# Patient Record
Sex: Male | Born: 1935 | Race: White | Hispanic: No | Smoking: Never smoker
Health system: Southern US, Community
[De-identification: ages and names within clinical notes are randomized; demographics above are authoritative.]

## PROBLEM LIST (undated history)

## (undated) DIAGNOSIS — I10 Essential (primary) hypertension: Secondary | ICD-10-CM

## (undated) DIAGNOSIS — K219 Gastro-esophageal reflux disease without esophagitis: Secondary | ICD-10-CM

## (undated) HISTORY — PX: EYE SURGERY: SHX253

## (undated) HISTORY — PX: HERNIA REPAIR: SHX51

---

## 2010-05-31 ENCOUNTER — Encounter (INDEPENDENT_AMBULATORY_CARE_PROVIDER_SITE_OTHER): Payer: Self-pay | Admitting: Ophthalmology

## 2010-05-31 ENCOUNTER — Ambulatory Visit (HOSPITAL_COMMUNITY)
Admission: RE | Admit: 2010-05-31 | Discharge: 2010-06-01 | Payer: Self-pay | Source: Home / Self Care | Attending: Ophthalmology | Admitting: Ophthalmology

## 2010-06-06 LAB — CBC
HCT: 43.7 % (ref 39.0–52.0)
Hemoglobin: 14.7 g/dL (ref 13.0–17.0)
MCH: 30.3 pg (ref 26.0–34.0)
MCHC: 33.6 g/dL (ref 30.0–36.0)
MCV: 90.1 fL (ref 78.0–100.0)
Platelets: 259 10*3/uL (ref 150–400)
RBC: 4.85 MIL/uL (ref 4.22–5.81)
RDW: 12.8 % (ref 11.5–15.5)
WBC: 8.4 10*3/uL (ref 4.0–10.5)

## 2010-06-06 LAB — BASIC METABOLIC PANEL
BUN: 13 mg/dL (ref 6–23)
CO2: 27 mEq/L (ref 19–32)
Calcium: 9.2 mg/dL (ref 8.4–10.5)
Chloride: 104 mEq/L (ref 96–112)
Creatinine, Ser: 1.44 mg/dL (ref 0.4–1.5)
GFR calc Af Amer: 56 mL/min — ABNORMAL LOW (ref >60)
GFR calc non Af Amer: 47 mL/min — ABNORMAL LOW (ref >60)
Glucose, Bld: 87 mg/dL (ref 70–99)
Potassium: 4.7 mEq/L (ref 3.5–5.1)
Sodium: 139 mEq/L (ref 135–145)

## 2010-06-06 LAB — SURGICAL PCR SCREEN
MRSA, PCR: NEGATIVE
Staphylococcus aureus: NEGATIVE

## 2011-03-10 ENCOUNTER — Encounter: Payer: Self-pay | Admitting: Emergency Medicine

## 2011-03-10 ENCOUNTER — Emergency Department (HOSPITAL_COMMUNITY): Payer: Medicare Other

## 2011-03-10 ENCOUNTER — Emergency Department (HOSPITAL_COMMUNITY)
Admission: EM | Admit: 2011-03-10 | Discharge: 2011-03-10 | Disposition: A | Discharge status: Home / Self Care | Payer: Medicare Other | Attending: Emergency Medicine | Admitting: Emergency Medicine

## 2011-03-10 ENCOUNTER — Other Ambulatory Visit: Payer: Self-pay

## 2011-03-10 DIAGNOSIS — X58XXXA Exposure to other specified factors, initial encounter: Secondary | ICD-10-CM | POA: Insufficient documentation

## 2011-03-10 DIAGNOSIS — S46819A Strain of other muscles, fascia and tendons at shoulder and upper arm level, unspecified arm, initial encounter: Secondary | ICD-10-CM

## 2011-03-10 DIAGNOSIS — I491 Atrial premature depolarization: Secondary | ICD-10-CM | POA: Insufficient documentation

## 2011-03-10 DIAGNOSIS — S43499A Other sprain of unspecified shoulder joint, initial encounter: Secondary | ICD-10-CM | POA: Insufficient documentation

## 2011-03-10 DIAGNOSIS — K219 Gastro-esophageal reflux disease without esophagitis: Secondary | ICD-10-CM | POA: Insufficient documentation

## 2011-03-10 DIAGNOSIS — I10 Essential (primary) hypertension: Secondary | ICD-10-CM | POA: Insufficient documentation

## 2011-03-10 DIAGNOSIS — R079 Chest pain, unspecified: Secondary | ICD-10-CM | POA: Insufficient documentation

## 2011-03-10 DIAGNOSIS — I4949 Other premature depolarization: Secondary | ICD-10-CM | POA: Insufficient documentation

## 2011-03-10 HISTORY — DX: Essential (primary) hypertension: I10

## 2011-03-10 HISTORY — DX: Gastro-esophageal reflux disease without esophagitis: K21.9

## 2011-03-10 LAB — CBC
HCT: 42.8 % (ref 39.0–52.0)
Hemoglobin: 14.5 g/dL (ref 13.0–17.0)
MCH: 30.3 pg (ref 26.0–34.0)
MCHC: 33.9 g/dL (ref 30.0–36.0)
MCV: 89.4 fL (ref 78.0–100.0)
Platelets: 218 10*3/uL (ref 150–400)
RBC: 4.79 MIL/uL (ref 4.22–5.81)
RDW: 12.7 % (ref 11.5–15.5)
WBC: 9.4 10*3/uL (ref 4.0–10.5)

## 2011-03-10 LAB — DIFFERENTIAL
Basophils Absolute: 0 10*3/uL (ref 0.0–0.1)
Basophils Relative: 0 % (ref 0–1)
Eosinophils Absolute: 0.3 10*3/uL (ref 0.0–0.7)
Eosinophils Relative: 4 % (ref 0–5)
Lymphocytes Relative: 31 % (ref 12–46)
Lymphs Abs: 3 10*3/uL (ref 0.7–4.0)
Monocytes Absolute: 0.8 10*3/uL (ref 0.1–1.0)
Monocytes Relative: 9 % (ref 3–12)
Neutro Abs: 5.3 10*3/uL (ref 1.7–7.7)
Neutrophils Relative %: 56 % (ref 43–77)

## 2011-03-10 LAB — TROPONIN I: Troponin I: <0.3 ng/mL (ref <0.30)

## 2011-03-10 LAB — BASIC METABOLIC PANEL
BUN: 10 mg/dL (ref 6–23)
CO2: 28 mEq/L (ref 19–32)
Calcium: 9.2 mg/dL (ref 8.4–10.5)
Chloride: 104 mEq/L (ref 96–112)
Creatinine, Ser: 1.33 mg/dL (ref 0.50–1.35)
GFR calc Af Amer: 54 mL/min — ABNORMAL LOW (ref >90)
GFR calc non Af Amer: 47 mL/min — ABNORMAL LOW (ref >90)
Glucose, Bld: 87 mg/dL (ref 70–99)
Potassium: 4 mEq/L (ref 3.5–5.1)
Sodium: 141 mEq/L (ref 135–145)

## 2011-03-10 MED ORDER — HYDROCODONE-ACETAMINOPHEN 5-325 MG PO TABS: 1.0000 | ORAL_TABLET | ORAL | Status: AC | PRN

## 2011-03-10 NOTE — ED Notes (Signed)
Pt c/o numbness and tingling to left arm. Pt states he also is having indigestion.

## 2011-03-10 NOTE — ED Notes (Signed)
Pt c/o intermittent left arm numbness/pain radiating into neck x 10 days with indigestion. Pain worse today.

## 2011-03-10 NOTE — ED Provider Notes (Signed)
History     CSN: [id number] Arrival date & time: 03/10/2011  5:47 PM   First MD Initiated Contact with Patient 03/10/11 1759      Chief Complaint  Patient presents with  . Arm Pain    (Consider location/radiation/quality/duration/timing/severity/associated sxs/prior treatment) The history is provided by the patient.   patient has had left shoulder pain going down his left arm for the last 10 days. There is no trauma. He states it gets worse when he is at rest. He states it feels like it goes asleep. He states it also goes up the left side of his neck. He also has having episodes of some indigestion for the last 3 days. He states this feels like his acid reflux. It is not associated with the chest pain. No cough or dyspnea. No fevers. No trauma. He states that the pains are better with physical activity. He states he just made the neck pain worse and I said made it better. He does not smoke. Never had heart problems, although he does have high blood pressure.  Past Medical History  Diagnosis Date  . Hypertension   . Acid reflux     Past Surgical History  Procedure Date  . Eye surgery   . Hernia repair     History reviewed. No pertinent family history.  History  Substance Use Topics  . Smoking status: Never Smoker   . Smokeless tobacco: Not on file  . Alcohol Use: No      Review of Systems  Constitutional: Negative for activity change and appetite change.  HENT: Negative for neck stiffness.   Eyes: Negative for pain.  Respiratory: Negative for chest tightness and shortness of breath.   Cardiovascular: Negative for chest pain and leg swelling.  Gastrointestinal: Negative for nausea, vomiting, abdominal pain and diarrhea.  Genitourinary: Negative for flank pain.  Musculoskeletal: Negative for back pain and joint swelling.  Skin: Negative for rash.  Neurological: Positive for numbness. Negative for weakness and headaches.  Psychiatric/Behavioral: Negative for behavioral  problems.    Allergies  Review of patient's allergies indicates no known allergies.  Home Medications   Current Outpatient Rx  Name Route Sig Dispense Refill  . LISINOPRIL 10 MG PO TABS Oral Take 10 mg by mouth daily.      Marland Kitchen OMEPRAZOLE 20 MG PO CPDR Oral Take 20 mg by mouth daily.      Marland Kitchen HYDROCODONE-ACETAMINOPHEN 5-325 MG PO TABS Oral Take 1 tablet by mouth every 4 (four) hours as needed for pain. 10 tablet 0    BP 170/72  Pulse 78  Temp(Src) 97.4 F (36.3 C) (Oral)  Resp 20  Ht 5\' 7"  (1.702 m)  Wt 135 lb (61.236 kg)  BMI 21.14 kg/m2  SpO2 100%  Physical Exam  Nursing note and vitals reviewed. Constitutional: He is oriented to person, place, and time. He appears well-developed and well-nourished.  HENT:  Head: Normocephalic and atraumatic.       Patient is somewhat hard of hearing  Eyes: EOM are normal. Pupils are equal, round, and reactive to light.  Neck: Normal range of motion. Neck supple.  Cardiovascular: Normal rate, regular rhythm and normal heart sounds.   No murmur heard. Pulmonary/Chest: Effort normal and breath sounds normal.  Abdominal: Soft. Bowel sounds are normal. He exhibits no distension and no mass. There is no tenderness. There is no rebound and no guarding.  Musculoskeletal: Normal range of motion. He exhibits no edema.       Patient is  tender over his left trapezius.  Neurological: He is alert and oriented to person, place, and time. No cranial nerve deficit.  Skin: Skin is warm and dry.  Psychiatric: He has a normal mood and affect.    ED Course  Procedures (including critical care time)  Labs Reviewed  BASIC METABOLIC PANEL - Abnormal; Notable for the following:    GFR calc non Af Amer 47 (*)    GFR calc Af Amer 54 (*)    All other components within normal limits  TROPONIN I  CBC  DIFFERENTIAL   Dg Chest 2 View  03/10/2011  *RADIOLOGY REPORT*  Clinical Data: Chest pain  CHEST - 2 VIEW  Comparison: Chest radiograph 05/31/2010  Findings:  Normal mediastinum and cardiac silhouette.  No effusion, infiltrate, or pneumothorax. Degenerative osteophytosis of the thoracic spine.  IMPRESSION: No acute cardiopulmonary process.  The  Original Report Authenticated By: Genevive Bi, M.D.     1. Trapezius muscle strain      Date: 03/10/2011  Rate: 80  Rhythm: normal sinus rhythm, premature atrial contractions (PAC) and premature ventricular contractions (PVC)  QRS Axis: normal  Intervals: normal  ST/T Wave abnormalities: normal  Conduction Disutrbances:none  Narrative Interpretation:   Old EKG Reviewed: PVC and PACs new    MDM  Patient has had pain in his left shoulder and arm and up his neck for last 10 days. He notices more at rest. He states it is better with work. He states that ice has helped he does get worse. He states the arm feels tingly. He also has had some pain that he describes as his GERD. He states that it is not associated with the arm pain. EKG is reassuring. X-ray also reassuring. Patient is tender over the trapezius the likely source of his pain. he'll be discharged to follow with his Dr.        Juliet Rude. Rubin Payor, MD 03/10/11 1955

## 2011-05-31 ENCOUNTER — Encounter (INDEPENDENT_AMBULATORY_CARE_PROVIDER_SITE_OTHER): Payer: Medicare Other | Admitting: Ophthalmology

## 2011-05-31 DIAGNOSIS — H353290 Exudative age-related macular degeneration, unspecified eye, stage unspecified: Secondary | ICD-10-CM

## 2011-05-31 DIAGNOSIS — H43819 Vitreous degeneration, unspecified eye: Secondary | ICD-10-CM

## 2011-05-31 DIAGNOSIS — H3530 Unspecified macular degeneration: Secondary | ICD-10-CM

## 2011-05-31 DIAGNOSIS — H35059 Retinal neovascularization, unspecified, unspecified eye: Secondary | ICD-10-CM

## 2011-06-08 ENCOUNTER — Other Ambulatory Visit (INDEPENDENT_AMBULATORY_CARE_PROVIDER_SITE_OTHER): Payer: Medicare Other | Admitting: Ophthalmology

## 2011-06-08 DIAGNOSIS — H353290 Exudative age-related macular degeneration, unspecified eye, stage unspecified: Secondary | ICD-10-CM

## 2011-06-08 DIAGNOSIS — H3530 Unspecified macular degeneration: Secondary | ICD-10-CM

## 2011-06-08 DIAGNOSIS — H43819 Vitreous degeneration, unspecified eye: Secondary | ICD-10-CM

## 2011-06-08 DIAGNOSIS — H35059 Retinal neovascularization, unspecified, unspecified eye: Secondary | ICD-10-CM

## 2011-06-30 ENCOUNTER — Encounter (INDEPENDENT_AMBULATORY_CARE_PROVIDER_SITE_OTHER): Payer: Medicare Other | Admitting: Ophthalmology

## 2011-06-30 DIAGNOSIS — H43819 Vitreous degeneration, unspecified eye: Secondary | ICD-10-CM

## 2011-06-30 DIAGNOSIS — H3530 Unspecified macular degeneration: Secondary | ICD-10-CM

## 2011-06-30 DIAGNOSIS — H353290 Exudative age-related macular degeneration, unspecified eye, stage unspecified: Secondary | ICD-10-CM

## 2011-07-28 ENCOUNTER — Encounter (INDEPENDENT_AMBULATORY_CARE_PROVIDER_SITE_OTHER): Payer: Medicare Other | Admitting: Ophthalmology

## 2011-07-28 DIAGNOSIS — H353290 Exudative age-related macular degeneration, unspecified eye, stage unspecified: Secondary | ICD-10-CM

## 2011-07-28 DIAGNOSIS — H43819 Vitreous degeneration, unspecified eye: Secondary | ICD-10-CM

## 2011-07-28 DIAGNOSIS — H3530 Unspecified macular degeneration: Secondary | ICD-10-CM

## 2011-08-30 ENCOUNTER — Encounter (INDEPENDENT_AMBULATORY_CARE_PROVIDER_SITE_OTHER): Payer: Medicare Other | Admitting: Ophthalmology

## 2011-08-30 DIAGNOSIS — H353290 Exudative age-related macular degeneration, unspecified eye, stage unspecified: Secondary | ICD-10-CM

## 2011-08-30 DIAGNOSIS — H43819 Vitreous degeneration, unspecified eye: Secondary | ICD-10-CM

## 2011-08-30 DIAGNOSIS — H3530 Unspecified macular degeneration: Secondary | ICD-10-CM

## 2011-09-28 ENCOUNTER — Encounter (INDEPENDENT_AMBULATORY_CARE_PROVIDER_SITE_OTHER): Payer: Medicare Other | Admitting: Ophthalmology

## 2011-10-02 ENCOUNTER — Encounter (INDEPENDENT_AMBULATORY_CARE_PROVIDER_SITE_OTHER): Payer: Medicare Other | Admitting: Ophthalmology

## 2011-10-02 DIAGNOSIS — H353290 Exudative age-related macular degeneration, unspecified eye, stage unspecified: Secondary | ICD-10-CM

## 2011-10-02 DIAGNOSIS — H3530 Unspecified macular degeneration: Secondary | ICD-10-CM

## 2011-10-30 ENCOUNTER — Encounter (INDEPENDENT_AMBULATORY_CARE_PROVIDER_SITE_OTHER): Payer: Medicare Other | Admitting: Ophthalmology

## 2011-10-30 DIAGNOSIS — H3530 Unspecified macular degeneration: Secondary | ICD-10-CM

## 2011-10-30 DIAGNOSIS — H353290 Exudative age-related macular degeneration, unspecified eye, stage unspecified: Secondary | ICD-10-CM

## 2011-10-30 DIAGNOSIS — H43819 Vitreous degeneration, unspecified eye: Secondary | ICD-10-CM

## 2011-11-27 ENCOUNTER — Encounter (INDEPENDENT_AMBULATORY_CARE_PROVIDER_SITE_OTHER): Payer: Medicare Other | Admitting: Ophthalmology

## 2011-11-27 DIAGNOSIS — H43399 Other vitreous opacities, unspecified eye: Secondary | ICD-10-CM

## 2011-11-27 DIAGNOSIS — H353290 Exudative age-related macular degeneration, unspecified eye, stage unspecified: Secondary | ICD-10-CM

## 2011-11-27 DIAGNOSIS — H3530 Unspecified macular degeneration: Secondary | ICD-10-CM

## 2011-11-27 DIAGNOSIS — H35439 Paving stone degeneration of retina, unspecified eye: Secondary | ICD-10-CM

## 2011-11-27 DIAGNOSIS — H35039 Hypertensive retinopathy, unspecified eye: Secondary | ICD-10-CM

## 2011-11-27 DIAGNOSIS — I10 Essential (primary) hypertension: Secondary | ICD-10-CM

## 2012-01-01 ENCOUNTER — Encounter (INDEPENDENT_AMBULATORY_CARE_PROVIDER_SITE_OTHER): Payer: Medicare Other | Admitting: Ophthalmology

## 2012-01-01 DIAGNOSIS — H43819 Vitreous degeneration, unspecified eye: Secondary | ICD-10-CM

## 2012-01-01 DIAGNOSIS — H353290 Exudative age-related macular degeneration, unspecified eye, stage unspecified: Secondary | ICD-10-CM

## 2012-01-01 DIAGNOSIS — I10 Essential (primary) hypertension: Secondary | ICD-10-CM

## 2012-01-01 DIAGNOSIS — H3530 Unspecified macular degeneration: Secondary | ICD-10-CM

## 2012-01-01 DIAGNOSIS — H35039 Hypertensive retinopathy, unspecified eye: Secondary | ICD-10-CM

## 2012-02-05 ENCOUNTER — Encounter (INDEPENDENT_AMBULATORY_CARE_PROVIDER_SITE_OTHER): Payer: Medicare Other | Admitting: Ophthalmology

## 2012-02-05 DIAGNOSIS — I10 Essential (primary) hypertension: Secondary | ICD-10-CM

## 2012-02-05 DIAGNOSIS — H353290 Exudative age-related macular degeneration, unspecified eye, stage unspecified: Secondary | ICD-10-CM

## 2012-02-05 DIAGNOSIS — H3530 Unspecified macular degeneration: Secondary | ICD-10-CM

## 2012-02-05 DIAGNOSIS — H43819 Vitreous degeneration, unspecified eye: Secondary | ICD-10-CM

## 2012-02-05 DIAGNOSIS — H35039 Hypertensive retinopathy, unspecified eye: Secondary | ICD-10-CM

## 2012-03-04 ENCOUNTER — Encounter (INDEPENDENT_AMBULATORY_CARE_PROVIDER_SITE_OTHER): Payer: Medicare Other | Admitting: Ophthalmology

## 2012-03-04 DIAGNOSIS — H35039 Hypertensive retinopathy, unspecified eye: Secondary | ICD-10-CM

## 2012-03-04 DIAGNOSIS — H43819 Vitreous degeneration, unspecified eye: Secondary | ICD-10-CM

## 2012-03-04 DIAGNOSIS — H353290 Exudative age-related macular degeneration, unspecified eye, stage unspecified: Secondary | ICD-10-CM

## 2012-03-04 DIAGNOSIS — I10 Essential (primary) hypertension: Secondary | ICD-10-CM

## 2012-03-04 DIAGNOSIS — H3530 Unspecified macular degeneration: Secondary | ICD-10-CM

## 2012-04-01 ENCOUNTER — Encounter (INDEPENDENT_AMBULATORY_CARE_PROVIDER_SITE_OTHER): Payer: Medicare Other | Admitting: Ophthalmology

## 2012-04-11 ENCOUNTER — Encounter (INDEPENDENT_AMBULATORY_CARE_PROVIDER_SITE_OTHER): Payer: Medicare Other | Admitting: Ophthalmology

## 2012-04-11 DIAGNOSIS — H35039 Hypertensive retinopathy, unspecified eye: Secondary | ICD-10-CM

## 2012-04-11 DIAGNOSIS — H3530 Unspecified macular degeneration: Secondary | ICD-10-CM

## 2012-04-11 DIAGNOSIS — H43819 Vitreous degeneration, unspecified eye: Secondary | ICD-10-CM

## 2012-04-11 DIAGNOSIS — I10 Essential (primary) hypertension: Secondary | ICD-10-CM

## 2012-04-11 DIAGNOSIS — H353290 Exudative age-related macular degeneration, unspecified eye, stage unspecified: Secondary | ICD-10-CM

## 2012-05-23 ENCOUNTER — Encounter (INDEPENDENT_AMBULATORY_CARE_PROVIDER_SITE_OTHER): Payer: Medicare Other | Admitting: Ophthalmology

## 2012-05-23 DIAGNOSIS — H35039 Hypertensive retinopathy, unspecified eye: Secondary | ICD-10-CM

## 2012-05-23 DIAGNOSIS — H3530 Unspecified macular degeneration: Secondary | ICD-10-CM

## 2012-05-23 DIAGNOSIS — H43819 Vitreous degeneration, unspecified eye: Secondary | ICD-10-CM

## 2012-05-23 DIAGNOSIS — H353290 Exudative age-related macular degeneration, unspecified eye, stage unspecified: Secondary | ICD-10-CM

## 2012-05-23 DIAGNOSIS — I10 Essential (primary) hypertension: Secondary | ICD-10-CM

## 2012-06-24 ENCOUNTER — Encounter (INDEPENDENT_AMBULATORY_CARE_PROVIDER_SITE_OTHER): Payer: Medicare Other | Admitting: Ophthalmology

## 2012-06-24 DIAGNOSIS — H3530 Unspecified macular degeneration: Secondary | ICD-10-CM

## 2012-06-24 DIAGNOSIS — H35039 Hypertensive retinopathy, unspecified eye: Secondary | ICD-10-CM

## 2012-06-24 DIAGNOSIS — I10 Essential (primary) hypertension: Secondary | ICD-10-CM

## 2012-06-24 DIAGNOSIS — H353290 Exudative age-related macular degeneration, unspecified eye, stage unspecified: Secondary | ICD-10-CM

## 2012-07-29 ENCOUNTER — Encounter (INDEPENDENT_AMBULATORY_CARE_PROVIDER_SITE_OTHER): Payer: Medicare Other | Admitting: Ophthalmology

## 2012-07-31 ENCOUNTER — Encounter (INDEPENDENT_AMBULATORY_CARE_PROVIDER_SITE_OTHER): Payer: Medicare Other | Admitting: Ophthalmology

## 2012-07-31 DIAGNOSIS — H353290 Exudative age-related macular degeneration, unspecified eye, stage unspecified: Secondary | ICD-10-CM

## 2012-09-11 ENCOUNTER — Encounter (INDEPENDENT_AMBULATORY_CARE_PROVIDER_SITE_OTHER): Payer: Medicare Other | Admitting: Ophthalmology

## 2012-09-11 DIAGNOSIS — I10 Essential (primary) hypertension: Secondary | ICD-10-CM

## 2012-09-11 DIAGNOSIS — H353290 Exudative age-related macular degeneration, unspecified eye, stage unspecified: Secondary | ICD-10-CM

## 2012-09-11 DIAGNOSIS — H3530 Unspecified macular degeneration: Secondary | ICD-10-CM

## 2012-09-11 DIAGNOSIS — H43819 Vitreous degeneration, unspecified eye: Secondary | ICD-10-CM

## 2012-09-11 DIAGNOSIS — H35039 Hypertensive retinopathy, unspecified eye: Secondary | ICD-10-CM

## 2012-10-24 ENCOUNTER — Encounter (INDEPENDENT_AMBULATORY_CARE_PROVIDER_SITE_OTHER): Payer: Medicare Other | Admitting: Ophthalmology

## 2012-10-24 DIAGNOSIS — H43819 Vitreous degeneration, unspecified eye: Secondary | ICD-10-CM

## 2012-10-24 DIAGNOSIS — I10 Essential (primary) hypertension: Secondary | ICD-10-CM

## 2012-10-24 DIAGNOSIS — H35039 Hypertensive retinopathy, unspecified eye: Secondary | ICD-10-CM

## 2012-10-24 DIAGNOSIS — H3530 Unspecified macular degeneration: Secondary | ICD-10-CM

## 2012-10-24 DIAGNOSIS — H353290 Exudative age-related macular degeneration, unspecified eye, stage unspecified: Secondary | ICD-10-CM

## 2012-12-06 ENCOUNTER — Encounter (INDEPENDENT_AMBULATORY_CARE_PROVIDER_SITE_OTHER): Payer: Medicare Other | Admitting: Ophthalmology

## 2012-12-06 DIAGNOSIS — H43819 Vitreous degeneration, unspecified eye: Secondary | ICD-10-CM

## 2012-12-06 DIAGNOSIS — H3530 Unspecified macular degeneration: Secondary | ICD-10-CM

## 2012-12-06 DIAGNOSIS — H35039 Hypertensive retinopathy, unspecified eye: Secondary | ICD-10-CM

## 2012-12-06 DIAGNOSIS — H353290 Exudative age-related macular degeneration, unspecified eye, stage unspecified: Secondary | ICD-10-CM

## 2012-12-06 DIAGNOSIS — I10 Essential (primary) hypertension: Secondary | ICD-10-CM

## 2013-01-24 ENCOUNTER — Encounter (INDEPENDENT_AMBULATORY_CARE_PROVIDER_SITE_OTHER): Payer: Medicare Other | Admitting: Ophthalmology

## 2013-01-24 DIAGNOSIS — H353290 Exudative age-related macular degeneration, unspecified eye, stage unspecified: Secondary | ICD-10-CM

## 2013-01-24 DIAGNOSIS — H43819 Vitreous degeneration, unspecified eye: Secondary | ICD-10-CM

## 2013-01-24 DIAGNOSIS — H3530 Unspecified macular degeneration: Secondary | ICD-10-CM

## 2013-01-24 DIAGNOSIS — I10 Essential (primary) hypertension: Secondary | ICD-10-CM

## 2013-01-24 DIAGNOSIS — H35039 Hypertensive retinopathy, unspecified eye: Secondary | ICD-10-CM

## 2013-03-14 ENCOUNTER — Encounter (INDEPENDENT_AMBULATORY_CARE_PROVIDER_SITE_OTHER): Payer: Medicare Other | Admitting: Ophthalmology

## 2013-03-14 DIAGNOSIS — H353290 Exudative age-related macular degeneration, unspecified eye, stage unspecified: Secondary | ICD-10-CM

## 2013-03-14 DIAGNOSIS — I10 Essential (primary) hypertension: Secondary | ICD-10-CM

## 2013-03-14 DIAGNOSIS — H35039 Hypertensive retinopathy, unspecified eye: Secondary | ICD-10-CM

## 2013-03-14 DIAGNOSIS — H3530 Unspecified macular degeneration: Secondary | ICD-10-CM

## 2013-03-14 DIAGNOSIS — H43819 Vitreous degeneration, unspecified eye: Secondary | ICD-10-CM

## 2013-05-09 ENCOUNTER — Encounter (INDEPENDENT_AMBULATORY_CARE_PROVIDER_SITE_OTHER): Payer: Medicare Other | Admitting: Ophthalmology

## 2013-05-09 DIAGNOSIS — H35039 Hypertensive retinopathy, unspecified eye: Secondary | ICD-10-CM

## 2013-05-09 DIAGNOSIS — H43819 Vitreous degeneration, unspecified eye: Secondary | ICD-10-CM

## 2013-05-09 DIAGNOSIS — H353290 Exudative age-related macular degeneration, unspecified eye, stage unspecified: Secondary | ICD-10-CM

## 2013-05-09 DIAGNOSIS — I10 Essential (primary) hypertension: Secondary | ICD-10-CM

## 2013-05-09 DIAGNOSIS — H3530 Unspecified macular degeneration: Secondary | ICD-10-CM

## 2013-07-18 ENCOUNTER — Encounter (INDEPENDENT_AMBULATORY_CARE_PROVIDER_SITE_OTHER): Payer: Medicare Other | Admitting: Ophthalmology

## 2013-09-03 ENCOUNTER — Encounter (INDEPENDENT_AMBULATORY_CARE_PROVIDER_SITE_OTHER): Payer: Medicare Other | Admitting: Ophthalmology

## 2013-09-03 DIAGNOSIS — H3530 Unspecified macular degeneration: Secondary | ICD-10-CM

## 2013-09-03 DIAGNOSIS — H353290 Exudative age-related macular degeneration, unspecified eye, stage unspecified: Secondary | ICD-10-CM

## 2013-09-03 DIAGNOSIS — H35039 Hypertensive retinopathy, unspecified eye: Secondary | ICD-10-CM

## 2013-09-03 DIAGNOSIS — I10 Essential (primary) hypertension: Secondary | ICD-10-CM

## 2013-09-03 DIAGNOSIS — H43819 Vitreous degeneration, unspecified eye: Secondary | ICD-10-CM

## 2013-12-03 ENCOUNTER — Encounter (INDEPENDENT_AMBULATORY_CARE_PROVIDER_SITE_OTHER): Payer: Medicare Other | Admitting: Ophthalmology

## 2013-12-03 DIAGNOSIS — H35039 Hypertensive retinopathy, unspecified eye: Secondary | ICD-10-CM

## 2013-12-03 DIAGNOSIS — H3530 Unspecified macular degeneration: Secondary | ICD-10-CM

## 2013-12-03 DIAGNOSIS — I10 Essential (primary) hypertension: Secondary | ICD-10-CM

## 2013-12-03 DIAGNOSIS — H43819 Vitreous degeneration, unspecified eye: Secondary | ICD-10-CM

## 2013-12-03 DIAGNOSIS — H353290 Exudative age-related macular degeneration, unspecified eye, stage unspecified: Secondary | ICD-10-CM

## 2014-04-01 ENCOUNTER — Encounter (INDEPENDENT_AMBULATORY_CARE_PROVIDER_SITE_OTHER): Payer: Medicare Other | Admitting: Ophthalmology

## 2014-05-13 ENCOUNTER — Encounter (INDEPENDENT_AMBULATORY_CARE_PROVIDER_SITE_OTHER): Payer: Medicare Other | Admitting: Ophthalmology

## 2014-05-27 ENCOUNTER — Encounter (INDEPENDENT_AMBULATORY_CARE_PROVIDER_SITE_OTHER): Payer: Medicare Other | Admitting: Ophthalmology

## 2014-05-27 DIAGNOSIS — H43813 Vitreous degeneration, bilateral: Secondary | ICD-10-CM

## 2014-05-27 DIAGNOSIS — H35033 Hypertensive retinopathy, bilateral: Secondary | ICD-10-CM | POA: Diagnosis not present

## 2014-05-27 DIAGNOSIS — H3531 Nonexudative age-related macular degeneration: Secondary | ICD-10-CM

## 2014-05-27 DIAGNOSIS — H3532 Exudative age-related macular degeneration: Secondary | ICD-10-CM | POA: Diagnosis not present

## 2014-05-27 DIAGNOSIS — I10 Essential (primary) hypertension: Secondary | ICD-10-CM

## 2014-07-13 DIAGNOSIS — M722 Plantar fascial fibromatosis: Secondary | ICD-10-CM | POA: Diagnosis not present

## 2014-07-13 DIAGNOSIS — M19072 Primary osteoarthritis, left ankle and foot: Secondary | ICD-10-CM | POA: Diagnosis not present

## 2014-11-25 ENCOUNTER — Encounter (INDEPENDENT_AMBULATORY_CARE_PROVIDER_SITE_OTHER): Payer: Medicare Other | Admitting: Ophthalmology

## 2014-12-07 ENCOUNTER — Encounter (INDEPENDENT_AMBULATORY_CARE_PROVIDER_SITE_OTHER): Payer: Medicare Other | Admitting: Ophthalmology

## 2014-12-07 DIAGNOSIS — I10 Essential (primary) hypertension: Secondary | ICD-10-CM | POA: Diagnosis not present

## 2014-12-07 DIAGNOSIS — H3531 Nonexudative age-related macular degeneration: Secondary | ICD-10-CM

## 2014-12-07 DIAGNOSIS — H35033 Hypertensive retinopathy, bilateral: Secondary | ICD-10-CM | POA: Diagnosis not present

## 2014-12-07 DIAGNOSIS — H43813 Vitreous degeneration, bilateral: Secondary | ICD-10-CM | POA: Diagnosis not present

## 2015-01-11 DIAGNOSIS — Z0000 Encounter for general adult medical examination without abnormal findings: Secondary | ICD-10-CM | POA: Diagnosis not present

## 2015-03-08 ENCOUNTER — Encounter (INDEPENDENT_AMBULATORY_CARE_PROVIDER_SITE_OTHER): Payer: Medicare Other | Admitting: Ophthalmology

## 2015-03-08 DIAGNOSIS — H353213 Exudative age-related macular degeneration, right eye, with inactive scar: Secondary | ICD-10-CM | POA: Diagnosis not present

## 2015-03-08 DIAGNOSIS — H43811 Vitreous degeneration, right eye: Secondary | ICD-10-CM | POA: Diagnosis not present

## 2015-03-08 DIAGNOSIS — I10 Essential (primary) hypertension: Secondary | ICD-10-CM | POA: Diagnosis not present

## 2015-03-08 DIAGNOSIS — H35033 Hypertensive retinopathy, bilateral: Secondary | ICD-10-CM

## 2015-04-27 DIAGNOSIS — H524 Presbyopia: Secondary | ICD-10-CM | POA: Diagnosis not present

## 2015-04-27 DIAGNOSIS — Z961 Presence of intraocular lens: Secondary | ICD-10-CM | POA: Diagnosis not present

## 2015-04-27 DIAGNOSIS — H353112 Nonexudative age-related macular degeneration, right eye, intermediate dry stage: Secondary | ICD-10-CM | POA: Diagnosis not present

## 2015-04-27 DIAGNOSIS — H353222 Exudative age-related macular degeneration, left eye, with inactive choroidal neovascularization: Secondary | ICD-10-CM | POA: Diagnosis not present

## 2015-06-07 ENCOUNTER — Encounter (INDEPENDENT_AMBULATORY_CARE_PROVIDER_SITE_OTHER): Payer: Medicare Other | Admitting: Ophthalmology

## 2015-06-07 DIAGNOSIS — H43813 Vitreous degeneration, bilateral: Secondary | ICD-10-CM | POA: Diagnosis not present

## 2015-06-07 DIAGNOSIS — H35033 Hypertensive retinopathy, bilateral: Secondary | ICD-10-CM | POA: Diagnosis not present

## 2015-06-07 DIAGNOSIS — I10 Essential (primary) hypertension: Secondary | ICD-10-CM | POA: Diagnosis not present

## 2015-06-07 DIAGNOSIS — H353231 Exudative age-related macular degeneration, bilateral, with active choroidal neovascularization: Secondary | ICD-10-CM

## 2015-07-12 DIAGNOSIS — M722 Plantar fascial fibromatosis: Secondary | ICD-10-CM | POA: Diagnosis not present

## 2015-07-12 DIAGNOSIS — I10 Essential (primary) hypertension: Secondary | ICD-10-CM | POA: Diagnosis not present

## 2015-07-12 DIAGNOSIS — M19072 Primary osteoarthritis, left ankle and foot: Secondary | ICD-10-CM | POA: Diagnosis not present

## 2015-07-12 DIAGNOSIS — R1084 Generalized abdominal pain: Secondary | ICD-10-CM | POA: Diagnosis not present

## 2015-07-12 DIAGNOSIS — E782 Mixed hyperlipidemia: Secondary | ICD-10-CM | POA: Diagnosis not present

## 2015-07-19 DIAGNOSIS — H903 Sensorineural hearing loss, bilateral: Secondary | ICD-10-CM | POA: Diagnosis not present

## 2015-07-19 DIAGNOSIS — I10 Essential (primary) hypertension: Secondary | ICD-10-CM | POA: Diagnosis not present

## 2015-07-19 DIAGNOSIS — K219 Gastro-esophageal reflux disease without esophagitis: Secondary | ICD-10-CM | POA: Diagnosis not present

## 2015-07-19 DIAGNOSIS — E782 Mixed hyperlipidemia: Secondary | ICD-10-CM | POA: Diagnosis not present

## 2015-10-11 ENCOUNTER — Encounter (INDEPENDENT_AMBULATORY_CARE_PROVIDER_SITE_OTHER): Payer: Medicare Other | Admitting: Ophthalmology

## 2015-10-20 ENCOUNTER — Encounter (INDEPENDENT_AMBULATORY_CARE_PROVIDER_SITE_OTHER): Payer: Medicare Other | Admitting: Ophthalmology

## 2015-10-20 DIAGNOSIS — I10 Essential (primary) hypertension: Secondary | ICD-10-CM | POA: Diagnosis not present

## 2015-10-20 DIAGNOSIS — H353231 Exudative age-related macular degeneration, bilateral, with active choroidal neovascularization: Secondary | ICD-10-CM | POA: Diagnosis not present

## 2015-10-20 DIAGNOSIS — H43811 Vitreous degeneration, right eye: Secondary | ICD-10-CM

## 2015-10-20 DIAGNOSIS — H35033 Hypertensive retinopathy, bilateral: Secondary | ICD-10-CM | POA: Diagnosis not present

## 2016-02-16 ENCOUNTER — Encounter (INDEPENDENT_AMBULATORY_CARE_PROVIDER_SITE_OTHER): Payer: Medicare Other | Admitting: Ophthalmology

## 2016-02-16 DIAGNOSIS — I10 Essential (primary) hypertension: Secondary | ICD-10-CM | POA: Diagnosis not present

## 2016-02-16 DIAGNOSIS — H353231 Exudative age-related macular degeneration, bilateral, with active choroidal neovascularization: Secondary | ICD-10-CM | POA: Diagnosis not present

## 2016-02-16 DIAGNOSIS — H35033 Hypertensive retinopathy, bilateral: Secondary | ICD-10-CM

## 2016-02-16 DIAGNOSIS — H43811 Vitreous degeneration, right eye: Secondary | ICD-10-CM | POA: Diagnosis not present

## 2016-02-16 DIAGNOSIS — H26491 Other secondary cataract, right eye: Secondary | ICD-10-CM

## 2016-04-26 DIAGNOSIS — I10 Essential (primary) hypertension: Secondary | ICD-10-CM | POA: Diagnosis not present

## 2016-04-26 DIAGNOSIS — M19011 Primary osteoarthritis, right shoulder: Secondary | ICD-10-CM | POA: Diagnosis not present

## 2016-04-26 DIAGNOSIS — M7551 Bursitis of right shoulder: Secondary | ICD-10-CM | POA: Diagnosis not present

## 2016-04-26 DIAGNOSIS — Z6820 Body mass index (BMI) 20.0-20.9, adult: Secondary | ICD-10-CM | POA: Diagnosis not present

## 2016-06-16 DIAGNOSIS — S29019A Strain of muscle and tendon of unspecified wall of thorax, initial encounter: Secondary | ICD-10-CM | POA: Diagnosis not present

## 2016-06-21 ENCOUNTER — Encounter (INDEPENDENT_AMBULATORY_CARE_PROVIDER_SITE_OTHER): Payer: Medicare Other | Admitting: Ophthalmology

## 2016-07-11 DIAGNOSIS — H903 Sensorineural hearing loss, bilateral: Secondary | ICD-10-CM | POA: Diagnosis not present

## 2016-07-11 DIAGNOSIS — B0229 Other postherpetic nervous system involvement: Secondary | ICD-10-CM | POA: Diagnosis not present

## 2016-07-11 DIAGNOSIS — K219 Gastro-esophageal reflux disease without esophagitis: Secondary | ICD-10-CM | POA: Diagnosis not present

## 2016-07-11 DIAGNOSIS — E782 Mixed hyperlipidemia: Secondary | ICD-10-CM | POA: Diagnosis not present

## 2016-07-11 DIAGNOSIS — I10 Essential (primary) hypertension: Secondary | ICD-10-CM | POA: Diagnosis not present

## 2016-07-24 DIAGNOSIS — I10 Essential (primary) hypertension: Secondary | ICD-10-CM | POA: Diagnosis not present

## 2016-08-17 DIAGNOSIS — M5412 Radiculopathy, cervical region: Secondary | ICD-10-CM | POA: Diagnosis not present

## 2016-08-17 DIAGNOSIS — M19012 Primary osteoarthritis, left shoulder: Secondary | ICD-10-CM | POA: Diagnosis not present

## 2016-10-18 ENCOUNTER — Encounter (INDEPENDENT_AMBULATORY_CARE_PROVIDER_SITE_OTHER): Payer: Medicare Other | Admitting: Ophthalmology

## 2016-10-18 DIAGNOSIS — H353231 Exudative age-related macular degeneration, bilateral, with active choroidal neovascularization: Secondary | ICD-10-CM | POA: Diagnosis not present

## 2016-10-18 DIAGNOSIS — H35033 Hypertensive retinopathy, bilateral: Secondary | ICD-10-CM | POA: Diagnosis not present

## 2016-10-18 DIAGNOSIS — H2703 Aphakia, bilateral: Secondary | ICD-10-CM

## 2016-10-18 DIAGNOSIS — H26491 Other secondary cataract, right eye: Secondary | ICD-10-CM | POA: Diagnosis not present

## 2016-10-18 DIAGNOSIS — I10 Essential (primary) hypertension: Secondary | ICD-10-CM | POA: Diagnosis not present

## 2016-10-18 DIAGNOSIS — H43813 Vitreous degeneration, bilateral: Secondary | ICD-10-CM

## 2016-11-09 ENCOUNTER — Ambulatory Visit (INDEPENDENT_AMBULATORY_CARE_PROVIDER_SITE_OTHER): Payer: Medicare Other | Admitting: Ophthalmology

## 2016-11-09 DIAGNOSIS — H2701 Aphakia, right eye: Secondary | ICD-10-CM

## 2017-01-01 DIAGNOSIS — M5412 Radiculopathy, cervical region: Secondary | ICD-10-CM | POA: Diagnosis not present

## 2017-01-01 DIAGNOSIS — M19012 Primary osteoarthritis, left shoulder: Secondary | ICD-10-CM | POA: Diagnosis not present

## 2017-03-08 ENCOUNTER — Encounter (INDEPENDENT_AMBULATORY_CARE_PROVIDER_SITE_OTHER): Payer: Medicare Other | Admitting: Ophthalmology

## 2017-04-02 ENCOUNTER — Encounter (INDEPENDENT_AMBULATORY_CARE_PROVIDER_SITE_OTHER): Payer: Medicare Other | Admitting: Ophthalmology

## 2017-04-02 DIAGNOSIS — H353231 Exudative age-related macular degeneration, bilateral, with active choroidal neovascularization: Secondary | ICD-10-CM | POA: Diagnosis not present

## 2017-04-02 DIAGNOSIS — I10 Essential (primary) hypertension: Secondary | ICD-10-CM

## 2017-04-02 DIAGNOSIS — H35033 Hypertensive retinopathy, bilateral: Secondary | ICD-10-CM

## 2017-04-02 DIAGNOSIS — H43813 Vitreous degeneration, bilateral: Secondary | ICD-10-CM | POA: Diagnosis not present

## 2017-07-30 ENCOUNTER — Encounter (INDEPENDENT_AMBULATORY_CARE_PROVIDER_SITE_OTHER): Payer: Medicare Other | Admitting: Ophthalmology

## 2017-07-30 DIAGNOSIS — H35033 Hypertensive retinopathy, bilateral: Secondary | ICD-10-CM

## 2017-07-30 DIAGNOSIS — H353231 Exudative age-related macular degeneration, bilateral, with active choroidal neovascularization: Secondary | ICD-10-CM

## 2017-07-30 DIAGNOSIS — H43811 Vitreous degeneration, right eye: Secondary | ICD-10-CM

## 2017-07-30 DIAGNOSIS — H35342 Macular cyst, hole, or pseudohole, left eye: Secondary | ICD-10-CM | POA: Diagnosis not present

## 2017-07-30 DIAGNOSIS — I10 Essential (primary) hypertension: Secondary | ICD-10-CM | POA: Diagnosis not present

## 2017-10-03 ENCOUNTER — Encounter (HOSPITAL_COMMUNITY): Payer: Self-pay | Admitting: Emergency Medicine

## 2017-10-03 ENCOUNTER — Emergency Department (HOSPITAL_COMMUNITY)
Admission: EM | Admit: 2017-10-03 | Discharge: 2017-10-03 | Disposition: A | Discharge status: Home / Self Care | Payer: Medicare Other | Attending: Emergency Medicine | Admitting: Emergency Medicine

## 2017-10-03 ENCOUNTER — Other Ambulatory Visit: Payer: Self-pay

## 2017-10-03 ENCOUNTER — Emergency Department (HOSPITAL_COMMUNITY): Payer: Medicare Other

## 2017-10-03 DIAGNOSIS — R0789 Other chest pain: Secondary | ICD-10-CM | POA: Insufficient documentation

## 2017-10-03 DIAGNOSIS — I10 Essential (primary) hypertension: Secondary | ICD-10-CM | POA: Insufficient documentation

## 2017-10-03 DIAGNOSIS — Z79899 Other long term (current) drug therapy: Secondary | ICD-10-CM | POA: Diagnosis not present

## 2017-10-03 DIAGNOSIS — R079 Chest pain, unspecified: Secondary | ICD-10-CM | POA: Diagnosis not present

## 2017-10-03 LAB — BASIC METABOLIC PANEL
Anion gap: 9 (ref 5–15)
BUN: 17 mg/dL (ref 6–20)
CO2: 25 mmol/L (ref 22–32)
Calcium: 8.7 mg/dL — ABNORMAL LOW (ref 8.9–10.3)
Chloride: 104 mmol/L (ref 101–111)
Creatinine, Ser: 1.53 mg/dL — ABNORMAL HIGH (ref 0.61–1.24)
GFR calc Af Amer: 43 mL/min — ABNORMAL LOW (ref >60)
GFR calc non Af Amer: 37 mL/min — ABNORMAL LOW (ref >60)
Glucose, Bld: 87 mg/dL (ref 65–99)
Potassium: 3.7 mmol/L (ref 3.5–5.1)
Sodium: 138 mmol/L (ref 135–145)

## 2017-10-03 LAB — HEPATIC FUNCTION PANEL
ALT: 12 U/L — ABNORMAL LOW (ref 17–63)
AST: 21 U/L (ref 15–41)
Albumin: 3.3 g/dL — ABNORMAL LOW (ref 3.5–5.0)
Alkaline Phosphatase: 91 U/L (ref 38–126)
Bilirubin, Direct: 0.2 mg/dL (ref 0.1–0.5)
Indirect Bilirubin: 0.5 mg/dL (ref 0.3–0.9)
Total Bilirubin: 0.7 mg/dL (ref 0.3–1.2)
Total Protein: 6.2 g/dL — ABNORMAL LOW (ref 6.5–8.1)

## 2017-10-03 LAB — LIPASE, BLOOD: Lipase: 33 U/L (ref 11–51)

## 2017-10-03 LAB — CBC
HCT: 37.5 % — ABNORMAL LOW (ref 39.0–52.0)
Hemoglobin: 12.2 g/dL — ABNORMAL LOW (ref 13.0–17.0)
MCH: 29.9 pg (ref 26.0–34.0)
MCHC: 32.5 g/dL (ref 30.0–36.0)
MCV: 91.9 fL (ref 78.0–100.0)
Platelets: 230 10*3/uL (ref 150–400)
RBC: 4.08 MIL/uL — ABNORMAL LOW (ref 4.22–5.81)
RDW: 12.8 % (ref 11.5–15.5)
WBC: 7.9 10*3/uL (ref 4.0–10.5)

## 2017-10-03 LAB — TROPONIN I
Troponin I: <0.03 ng/mL (ref <0.03)
Troponin I: <0.03 ng/mL (ref <0.03)

## 2017-10-03 MED ORDER — SODIUM CHLORIDE 0.9 % IV BOLUS
1000.0000 mL | Freq: Once | INTRAVENOUS | Status: AC
  Administered 2017-10-03: 1000 mL via INTRAVENOUS

## 2017-10-03 NOTE — ED Provider Notes (Signed)
[facility] EMERGENCY DEPARTMENT Provider Note   CSN: [id number] Arrival date & time: 10/03/17  1655     History   Chief Complaint Chief Complaint  Patient presents with  . Chest Pain    HPI Hector Lowe is a 84 y.o. male.  HPI Patient presents with 2 family members to assist with the HPI. They note that the patient was well until today, about 10 hours ago. About the time he developed some pain in the left upper chest, left axilla. Symptoms persisted in spite of taking antacid medication and Tums. However, symptoms have since resolved, and currently he states that he feels fine, only with mild abdominal unsettled sensation, but no pain, either in the thorax or abdomen. No persistent cough, fever. Patient was well prior to the onset of symptoms, and denies recent illness. Patient has no history of coronary disease. He does chew tobacco.  Past Medical History:  Diagnosis Date  . Acid reflux   . Hypertension     There are no active problems to display for this patient.   Past Surgical History:  Procedure Laterality Date  . EYE SURGERY    . HERNIA REPAIR          Home Medications    Prior to Admission medications   Medication Sig Start Date End Date Taking? Authorizing Provider  calcium carbonate (TUMS - DOSED IN MG ELEMENTAL CALCIUM) 500 MG chewable tablet Chew 1 tablet by mouth daily as needed for indigestion or heartburn.   Yes [provider]  ibuprofen (ADVIL,MOTRIN) 200 MG tablet Take 200 mg by mouth daily as needed for mild pain or moderate pain.   Yes [provider]  lisinopril (PRINIVIL,ZESTRIL) 10 MG tablet Take 10 mg by mouth daily.     Yes [provider]  omeprazole (PRILOSEC) 20 MG capsule Take 20 mg by mouth daily.     Yes [provider]    Family History History reviewed. No pertinent family history.  Social History Social History   Tobacco Use  . Smoking status: Never Smoker  . Smokeless tobacco:  Current User    Types: Chew  Substance Use Topics  . Alcohol use: No  . Drug use: No     Allergies   Patient has no known allergies.   Review of Systems Review of Systems  Constitutional:       Per HPI, otherwise negative  HENT:       Per HPI, otherwise negative  Respiratory:       Per HPI, otherwise negative  Cardiovascular:       Per HPI, otherwise negative  Gastrointestinal: Negative for vomiting.  Endocrine:       Negative aside from HPI  Genitourinary:       Neg aside from HPI   Musculoskeletal:       Per HPI, otherwise negative  Skin: Negative.   Neurological: Negative for syncope.     Physical Exam Updated Vital Signs BP (!) 171/79   Pulse 63   Temp 98.1 F (36.7 C) (Temporal)   Resp 14   Ht  (1.702 m)   Wt 56.7 kg (125 lb)   SpO2 100%   BMI 19.58 kg/m   Physical Exam  Constitutional: He is oriented to person, place, and time. He appears well-developed. No distress.  HENT:  Head: Normocephalic and atraumatic.  Eyes: Conjunctivae and EOM are normal.  Cardiovascular: Normal rate and regular rhythm.  Murmur heard. Pulmonary/Chest: Effort normal. No stridor. No  respiratory distress.  Abdominal: He exhibits no distension. There is no tenderness. There is no guarding.  Musculoskeletal: He exhibits no edema.  Neurological: He is alert and oriented to person, place, and time.  Skin: Skin is warm and dry.  Psychiatric: He has a normal mood and affect.  Nursing note and vitals reviewed.    ED Treatments / Results  Labs (all labs ordered are listed, but only abnormal results are displayed) Labs Reviewed  BASIC METABOLIC PANEL - Abnormal; Notable for the following components:      Result Value   Creatinine, Ser 1.53 (*)    Calcium 8.7 (*)    GFR calc non Af Amer 37 (*)    GFR calc Af Amer 43 (*)    All other components within normal limits  CBC - Abnormal; Notable for the following components:   RBC 4.08 (*)    Hemoglobin 12.2 (*)    HCT  37.5 (*)    All other components within normal limits  HEPATIC FUNCTION PANEL - Abnormal; Notable for the following components:   Total Protein 6.2 (*)    Albumin 3.3 (*)    ALT 12 (*)    All other components within normal limits  TROPONIN I  TROPONIN I  LIPASE, BLOOD    EKG EKG Interpretation  Date/Time:  Wednesday Oct 03 2017 17:01:55 EDT Ventricular Rate:  77 PR Interval:  170 QRS Duration: 84 QT Interval:  366 QTC Calculation: 414 R Axis:     Text Interpretation:  Sinus rhythm with Premature supraventricular complexes ST-t wave abnormality Abnormal ekg Confirmed by Gerhard Munch [phone]) on 10/03/2017 6:20:00 PM   Radiology Dg Chest 2 View  Result Date: 10/03/2017 CLINICAL DATA:  Chest pain EXAM: CHEST - 2 VIEW COMPARISON:  06/16/2016 FINDINGS: Hyperinflation with coarse chronic interstitial opacity. No acute consolidation or pleural effusion. Stable cardiomediastinal silhouette with aortic atherosclerosis. No pneumothorax. Degenerative changes of the spine and mild midthoracic wedging unchanged. IMPRESSION: No active cardiopulmonary disease. Electronically Signed   By: Jasmine Pang M.D.   On: 10/03/2017 18:34    Procedures Procedures (including critical care time)  Medications Ordered in ED Medications  sodium chloride 0.9 % bolus 1,000 mL (0 mLs Intravenous Stopped 10/03/17 2009)     Initial Impression / Assessment and Plan / ED Course  I have reviewed the triage vital signs and the nursing notes.  Pertinent labs & imaging results that were available during my care of the patient were reviewed by me and considered in my medical decision making (see chart for details).     8:50 PM On repeat exam the patient is awake alert, no distress, states that he feels better than he has felt all day. Given the persistency of his symptoms, with complete resolution, no ongoing abdominal pain, 2 normal troponin, nonischemic EKG, reassuring labs, there is low suspicion for new  coronary ischemia, no evidence for hepatobiliary dysfunction, and no respiratory findings either. Symptoms may have been secondary to gastroesophageal etiology given the patient's improvement after taking antacids earlier in the day, the time delay would be unusual. However, given his reassuring findings, resolution of symptoms, the patient was discharged in stable condition to follow-up with primary care.  Final Clinical Impressions(s) / ED Diagnoses  Atypical chest pain   Gerhard Munch, MD 10/03/17 2051

## 2017-10-03 NOTE — Discharge Instructions (Addendum)
As discussed, your evaluation today has been largely reassuring.  But, it is important that you monitor your condition carefully, and do not hesitate to return to the ED if you develop new, or concerning changes in your condition. ? ?Otherwise, please follow-up with your physician for appropriate ongoing care. ? ?

## 2017-10-03 NOTE — ED Notes (Signed)
Pt alert and oriented. Notified of treatment plan.

## 2017-10-03 NOTE — ED Notes (Signed)
Pt denies any pain at this moment.

## 2017-10-03 NOTE — ED Notes (Signed)
Pt ambulatory to waiting room. Pt verbalized understanding of discharge instructions.   

## 2017-10-03 NOTE — ED Triage Notes (Signed)
Pt reports L sided CP radiating to L axilla for approx 10 hours intermittently. Took Motrin and Tums without relief.

## 2017-11-28 ENCOUNTER — Encounter (INDEPENDENT_AMBULATORY_CARE_PROVIDER_SITE_OTHER): Payer: Medicare Other | Admitting: Ophthalmology

## 2017-11-28 DIAGNOSIS — I10 Essential (primary) hypertension: Secondary | ICD-10-CM | POA: Diagnosis not present

## 2017-11-28 DIAGNOSIS — H353231 Exudative age-related macular degeneration, bilateral, with active choroidal neovascularization: Secondary | ICD-10-CM | POA: Diagnosis not present

## 2017-11-28 DIAGNOSIS — H43811 Vitreous degeneration, right eye: Secondary | ICD-10-CM | POA: Diagnosis not present

## 2017-11-28 DIAGNOSIS — H35033 Hypertensive retinopathy, bilateral: Secondary | ICD-10-CM | POA: Diagnosis not present

## 2018-03-05 DIAGNOSIS — R42 Dizziness and giddiness: Secondary | ICD-10-CM | POA: Diagnosis not present

## 2018-03-05 DIAGNOSIS — H6122 Impacted cerumen, left ear: Secondary | ICD-10-CM | POA: Diagnosis not present

## 2018-03-05 DIAGNOSIS — Z6820 Body mass index (BMI) 20.0-20.9, adult: Secondary | ICD-10-CM | POA: Diagnosis not present

## 2018-03-16 DIAGNOSIS — I10 Essential (primary) hypertension: Secondary | ICD-10-CM | POA: Diagnosis not present

## 2018-03-16 DIAGNOSIS — K219 Gastro-esophageal reflux disease without esophagitis: Secondary | ICD-10-CM | POA: Diagnosis not present

## 2018-03-16 DIAGNOSIS — E782 Mixed hyperlipidemia: Secondary | ICD-10-CM | POA: Diagnosis not present

## 2018-03-23 DIAGNOSIS — K219 Gastro-esophageal reflux disease without esophagitis: Secondary | ICD-10-CM | POA: Diagnosis not present

## 2018-03-23 DIAGNOSIS — M545 Low back pain: Secondary | ICD-10-CM | POA: Diagnosis not present

## 2018-03-23 DIAGNOSIS — I10 Essential (primary) hypertension: Secondary | ICD-10-CM | POA: Diagnosis not present

## 2018-03-23 DIAGNOSIS — Z0001 Encounter for general adult medical examination with abnormal findings: Secondary | ICD-10-CM | POA: Diagnosis not present

## 2018-03-23 DIAGNOSIS — Z23 Encounter for immunization: Secondary | ICD-10-CM | POA: Diagnosis not present

## 2018-04-01 DIAGNOSIS — M9903 Segmental and somatic dysfunction of lumbar region: Secondary | ICD-10-CM | POA: Diagnosis not present

## 2018-04-01 DIAGNOSIS — M47816 Spondylosis without myelopathy or radiculopathy, lumbar region: Secondary | ICD-10-CM | POA: Diagnosis not present

## 2018-04-01 DIAGNOSIS — Z0001 Encounter for general adult medical examination with abnormal findings: Secondary | ICD-10-CM | POA: Diagnosis not present

## 2018-04-15 DIAGNOSIS — M47816 Spondylosis without myelopathy or radiculopathy, lumbar region: Secondary | ICD-10-CM | POA: Diagnosis not present

## 2018-04-15 DIAGNOSIS — M9903 Segmental and somatic dysfunction of lumbar region: Secondary | ICD-10-CM | POA: Diagnosis not present

## 2018-04-22 DIAGNOSIS — M47816 Spondylosis without myelopathy or radiculopathy, lumbar region: Secondary | ICD-10-CM | POA: Diagnosis not present

## 2018-04-22 DIAGNOSIS — M9903 Segmental and somatic dysfunction of lumbar region: Secondary | ICD-10-CM | POA: Diagnosis not present

## 2018-04-29 DIAGNOSIS — M47816 Spondylosis without myelopathy or radiculopathy, lumbar region: Secondary | ICD-10-CM | POA: Diagnosis not present

## 2018-04-29 DIAGNOSIS — M9903 Segmental and somatic dysfunction of lumbar region: Secondary | ICD-10-CM | POA: Diagnosis not present

## 2018-05-06 DIAGNOSIS — M47816 Spondylosis without myelopathy or radiculopathy, lumbar region: Secondary | ICD-10-CM | POA: Diagnosis not present

## 2018-05-06 DIAGNOSIS — M9903 Segmental and somatic dysfunction of lumbar region: Secondary | ICD-10-CM | POA: Diagnosis not present

## 2018-05-31 ENCOUNTER — Encounter (INDEPENDENT_AMBULATORY_CARE_PROVIDER_SITE_OTHER): Payer: Medicare Other | Admitting: Ophthalmology

## 2018-05-31 DIAGNOSIS — H35033 Hypertensive retinopathy, bilateral: Secondary | ICD-10-CM

## 2018-05-31 DIAGNOSIS — H43811 Vitreous degeneration, right eye: Secondary | ICD-10-CM | POA: Diagnosis not present

## 2018-05-31 DIAGNOSIS — I10 Essential (primary) hypertension: Secondary | ICD-10-CM

## 2018-05-31 DIAGNOSIS — H353231 Exudative age-related macular degeneration, bilateral, with active choroidal neovascularization: Secondary | ICD-10-CM | POA: Diagnosis not present

## 2018-10-07 DIAGNOSIS — M542 Cervicalgia: Secondary | ICD-10-CM | POA: Diagnosis not present

## 2018-11-29 ENCOUNTER — Encounter (INDEPENDENT_AMBULATORY_CARE_PROVIDER_SITE_OTHER): Payer: Medicare Other | Admitting: Ophthalmology

## 2018-12-25 ENCOUNTER — Encounter (INDEPENDENT_AMBULATORY_CARE_PROVIDER_SITE_OTHER): Payer: Medicare Other | Admitting: Ophthalmology

## 2019-03-11 ENCOUNTER — Other Ambulatory Visit: Payer: Self-pay

## 2019-03-11 ENCOUNTER — Encounter (INDEPENDENT_AMBULATORY_CARE_PROVIDER_SITE_OTHER): Payer: Medicare Other | Admitting: Ophthalmology

## 2019-03-11 DIAGNOSIS — H43811 Vitreous degeneration, right eye: Secondary | ICD-10-CM | POA: Diagnosis not present

## 2019-03-11 DIAGNOSIS — H35033 Hypertensive retinopathy, bilateral: Secondary | ICD-10-CM

## 2019-03-11 DIAGNOSIS — H353231 Exudative age-related macular degeneration, bilateral, with active choroidal neovascularization: Secondary | ICD-10-CM

## 2019-03-11 DIAGNOSIS — I10 Essential (primary) hypertension: Secondary | ICD-10-CM | POA: Diagnosis not present

## 2019-04-13 IMAGING — DX DG CHEST 2V
2 series · 2 of 2 positions shown · non-contrast
Comparison: 06/16/2016

CLINICAL DATA: Chest pain

EXAM:
CHEST - 2 VIEW

[chest pa]
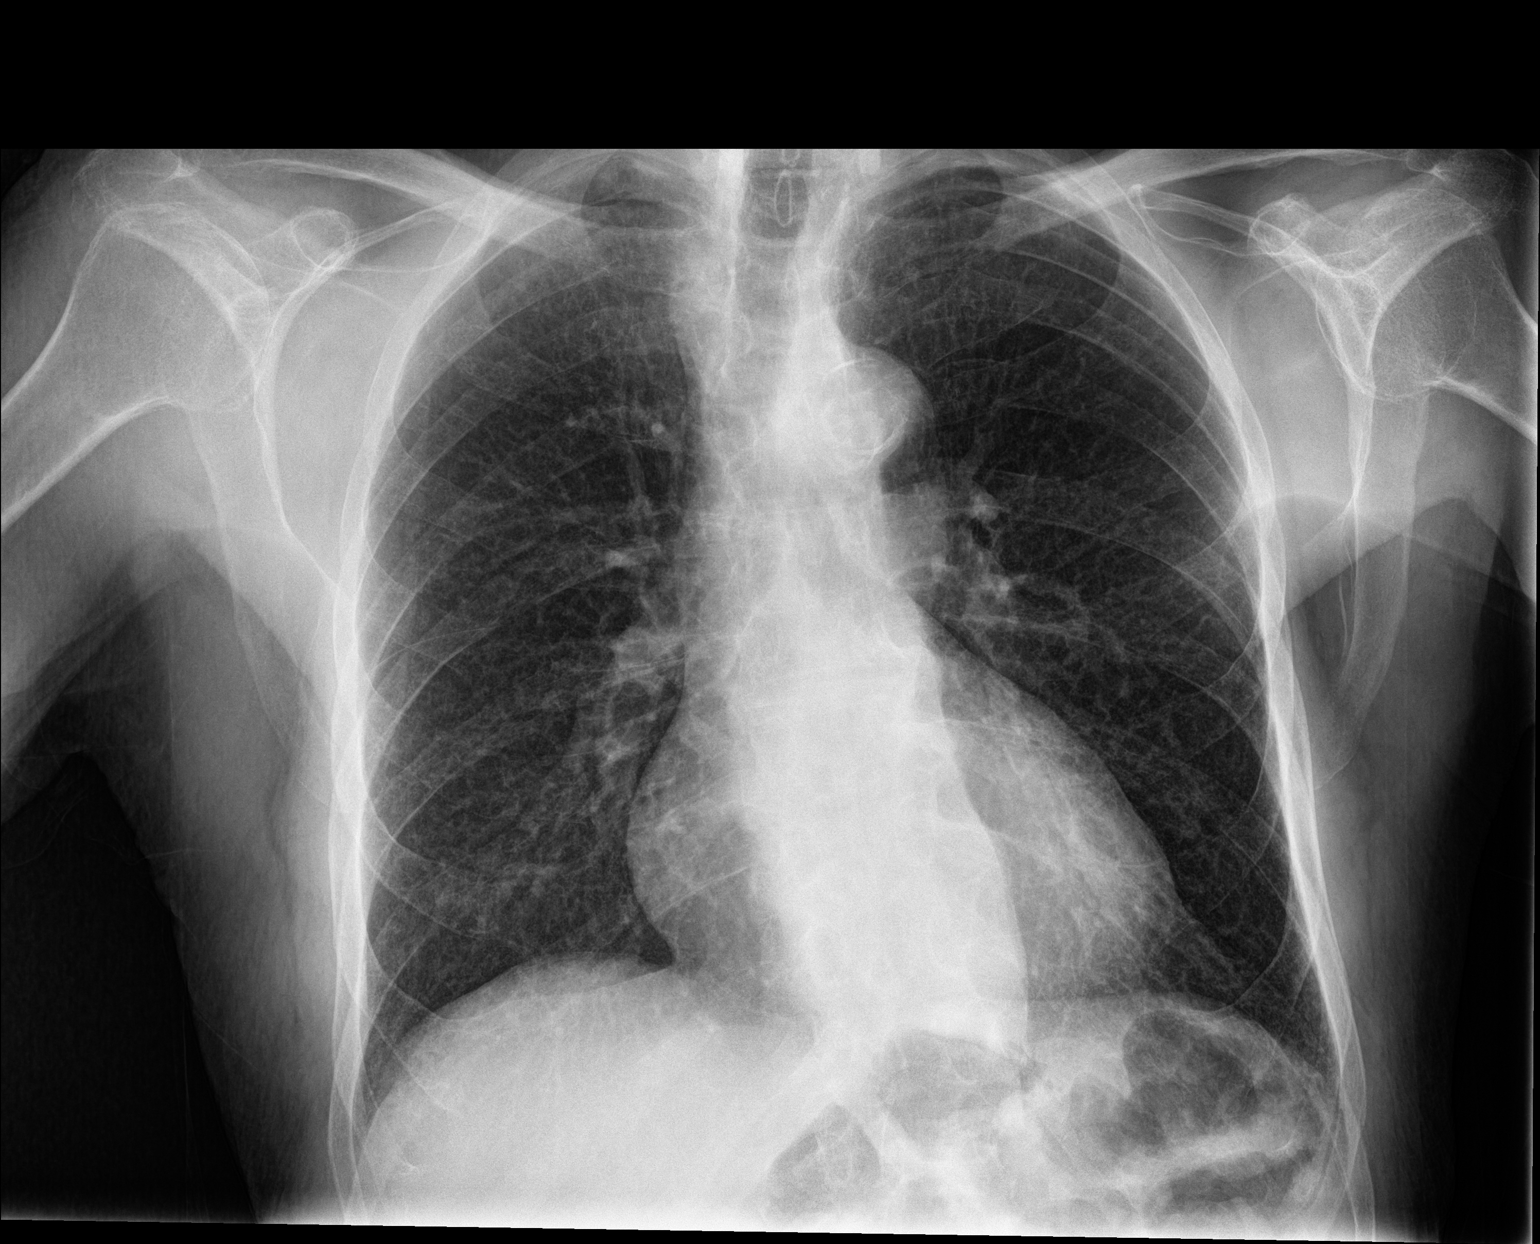

[chest lat]
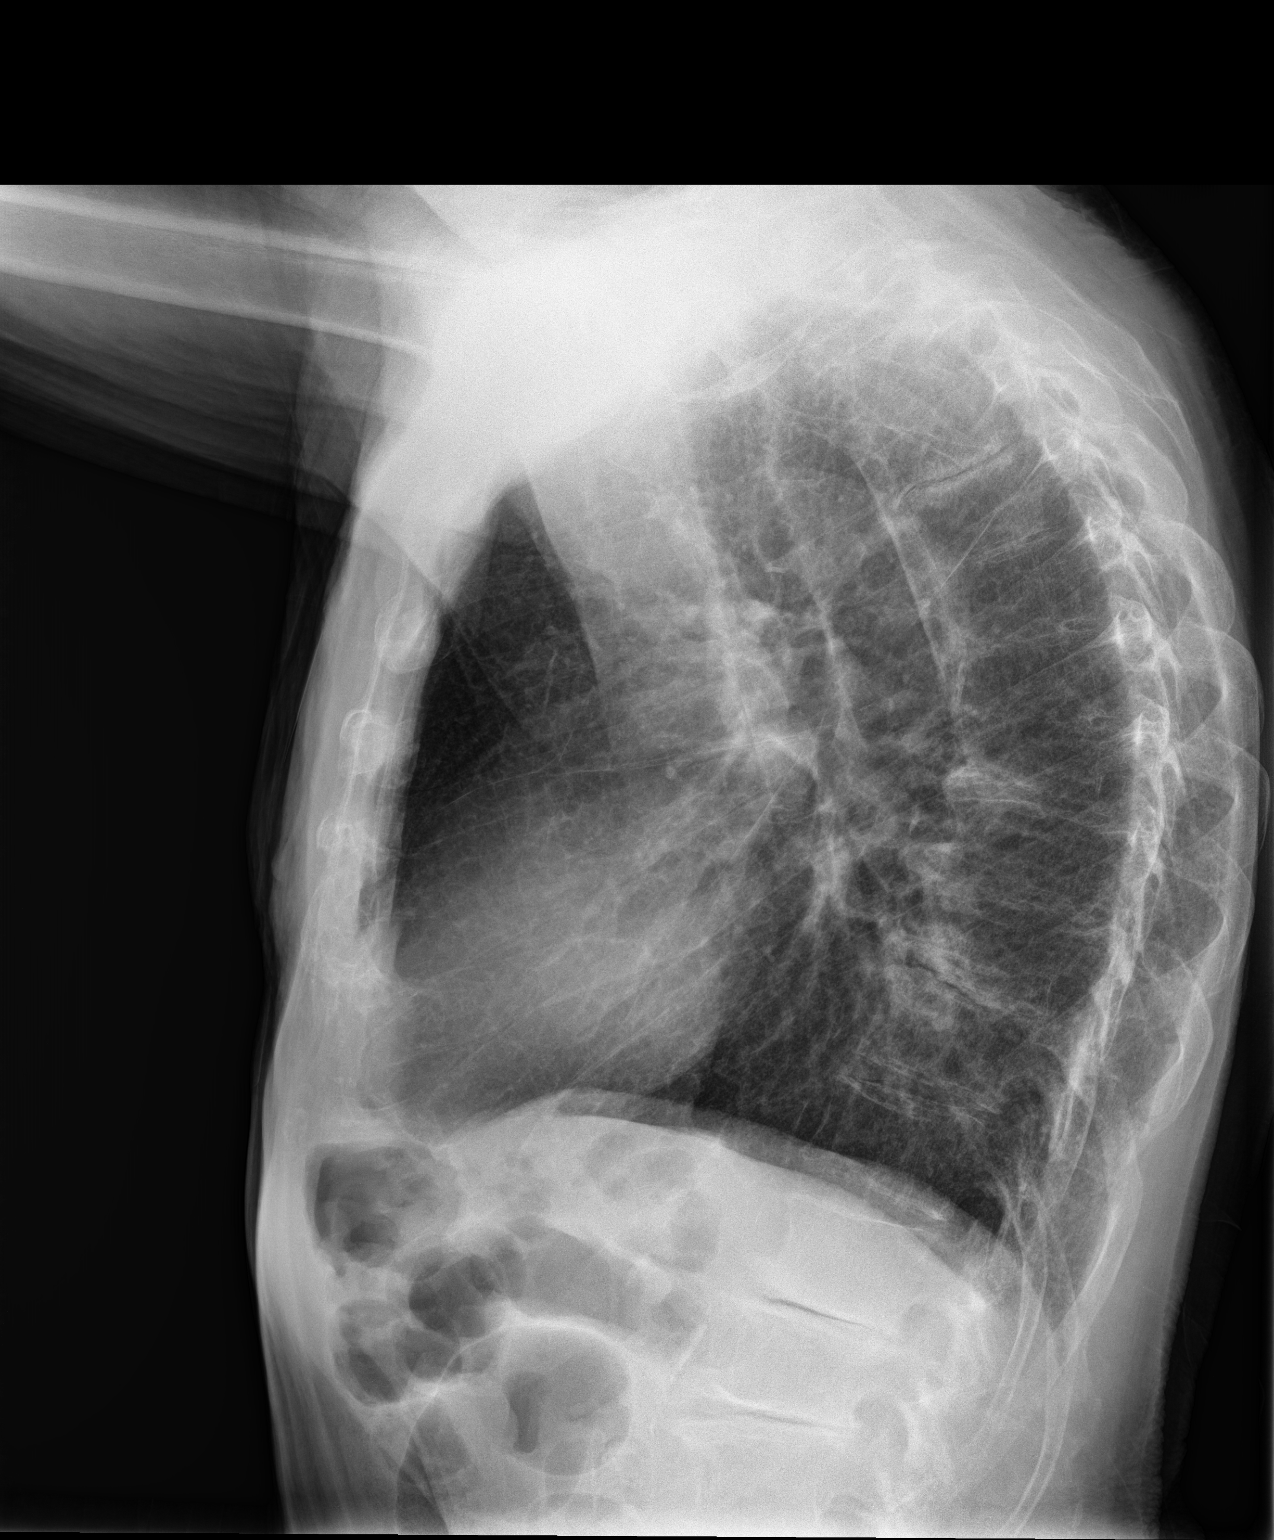

[2 of 2 positions shown; findings below may reference images not displayed]

FINDINGS: Hyperinflation with coarse chronic interstitial opacity. No acute
consolidation or pleural effusion. Stable cardiomediastinal
silhouette with aortic atherosclerosis. No pneumothorax.
Degenerative changes of the spine and mild midthoracic wedging
unchanged..
IMPRESSION: No active cardiopulmonary disease.

## 2019-04-16 ENCOUNTER — Other Ambulatory Visit: Payer: Self-pay

## 2019-04-16 NOTE — Patient Outreach (Signed)
[facility]) Care Management  04/16/2019  Hector Lowe [birth year] [id number]   Medication Adherence call to Mr. Hector Lowe Telephone call to Patient regarding Medication Adherence unable to reach patient. Hector Lowe is showing past due on Lisinopril 10 mg under [place].   [facility] Direct Dial [phone]  Fax [phone] [email or web address]

## 2019-05-17 DIAGNOSIS — Z23 Encounter for immunization: Secondary | ICD-10-CM | POA: Diagnosis not present

## 2019-05-17 DIAGNOSIS — E782 Mixed hyperlipidemia: Secondary | ICD-10-CM | POA: Diagnosis not present

## 2019-05-17 DIAGNOSIS — Z6820 Body mass index (BMI) 20.0-20.9, adult: Secondary | ICD-10-CM | POA: Diagnosis not present

## 2019-05-17 DIAGNOSIS — H9193 Unspecified hearing loss, bilateral: Secondary | ICD-10-CM | POA: Diagnosis not present

## 2019-05-17 DIAGNOSIS — M545 Low back pain: Secondary | ICD-10-CM | POA: Diagnosis not present

## 2019-05-17 DIAGNOSIS — K219 Gastro-esophageal reflux disease without esophagitis: Secondary | ICD-10-CM | POA: Diagnosis not present

## 2019-05-17 DIAGNOSIS — Z0001 Encounter for general adult medical examination with abnormal findings: Secondary | ICD-10-CM | POA: Diagnosis not present

## 2019-05-17 DIAGNOSIS — I10 Essential (primary) hypertension: Secondary | ICD-10-CM | POA: Diagnosis not present

## 2019-08-13 DIAGNOSIS — H905 Unspecified sensorineural hearing loss: Secondary | ICD-10-CM | POA: Diagnosis not present

## 2019-09-04 ENCOUNTER — Other Ambulatory Visit: Payer: Self-pay

## 2019-09-04 ENCOUNTER — Encounter (INDEPENDENT_AMBULATORY_CARE_PROVIDER_SITE_OTHER): Payer: Medicare Other | Admitting: Ophthalmology

## 2019-09-04 DIAGNOSIS — H353131 Nonexudative age-related macular degeneration, bilateral, early dry stage: Secondary | ICD-10-CM

## 2019-09-04 DIAGNOSIS — H35033 Hypertensive retinopathy, bilateral: Secondary | ICD-10-CM

## 2019-09-04 DIAGNOSIS — H43813 Vitreous degeneration, bilateral: Secondary | ICD-10-CM

## 2019-09-04 DIAGNOSIS — I10 Essential (primary) hypertension: Secondary | ICD-10-CM | POA: Diagnosis not present

## 2019-10-18 DIAGNOSIS — H5213 Myopia, bilateral: Secondary | ICD-10-CM | POA: Diagnosis not present

## 2020-01-27 DIAGNOSIS — R109 Unspecified abdominal pain: Secondary | ICD-10-CM | POA: Diagnosis not present

## 2020-01-27 DIAGNOSIS — Z681 Body mass index (BMI) 19 or less, adult: Secondary | ICD-10-CM | POA: Diagnosis not present

## 2020-01-27 DIAGNOSIS — N390 Urinary tract infection, site not specified: Secondary | ICD-10-CM | POA: Diagnosis not present

## 2020-01-27 DIAGNOSIS — K5900 Constipation, unspecified: Secondary | ICD-10-CM | POA: Diagnosis not present

## 2020-01-27 DIAGNOSIS — R079 Chest pain, unspecified: Secondary | ICD-10-CM | POA: Diagnosis not present

## 2020-01-27 DIAGNOSIS — R1084 Generalized abdominal pain: Secondary | ICD-10-CM | POA: Diagnosis not present

## 2020-01-27 DIAGNOSIS — Z886 Allergy status to analgesic agent status: Secondary | ICD-10-CM | POA: Diagnosis not present

## 2020-01-27 DIAGNOSIS — N3289 Other specified disorders of bladder: Secondary | ICD-10-CM | POA: Diagnosis not present

## 2020-01-27 DIAGNOSIS — R10817 Generalized abdominal tenderness: Secondary | ICD-10-CM | POA: Diagnosis not present

## 2020-01-27 DIAGNOSIS — N320 Bladder-neck obstruction: Secondary | ICD-10-CM | POA: Diagnosis not present

## 2020-01-27 DIAGNOSIS — I7090 Unspecified atherosclerosis: Secondary | ICD-10-CM | POA: Diagnosis not present

## 2020-01-28 DIAGNOSIS — N3289 Other specified disorders of bladder: Secondary | ICD-10-CM | POA: Diagnosis not present

## 2020-01-28 DIAGNOSIS — N320 Bladder-neck obstruction: Secondary | ICD-10-CM | POA: Diagnosis not present

## 2020-01-28 DIAGNOSIS — I7090 Unspecified atherosclerosis: Secondary | ICD-10-CM | POA: Diagnosis not present

## 2020-01-28 DIAGNOSIS — K5900 Constipation, unspecified: Secondary | ICD-10-CM | POA: Diagnosis not present

## 2020-01-31 DIAGNOSIS — R109 Unspecified abdominal pain: Secondary | ICD-10-CM | POA: Diagnosis not present

## 2020-01-31 DIAGNOSIS — K5900 Constipation, unspecified: Secondary | ICD-10-CM | POA: Diagnosis not present

## 2020-01-31 DIAGNOSIS — R0602 Shortness of breath: Secondary | ICD-10-CM | POA: Diagnosis not present

## 2020-02-11 ENCOUNTER — Other Ambulatory Visit: Payer: Self-pay

## 2020-02-11 ENCOUNTER — Encounter (INDEPENDENT_AMBULATORY_CARE_PROVIDER_SITE_OTHER): Payer: Medicare Other | Admitting: Ophthalmology

## 2020-02-11 DIAGNOSIS — I10 Essential (primary) hypertension: Secondary | ICD-10-CM

## 2020-02-11 DIAGNOSIS — H353231 Exudative age-related macular degeneration, bilateral, with active choroidal neovascularization: Secondary | ICD-10-CM | POA: Diagnosis not present

## 2020-02-11 DIAGNOSIS — H43813 Vitreous degeneration, bilateral: Secondary | ICD-10-CM

## 2020-02-11 DIAGNOSIS — H35033 Hypertensive retinopathy, bilateral: Secondary | ICD-10-CM

## 2020-02-12 DIAGNOSIS — K5900 Constipation, unspecified: Secondary | ICD-10-CM | POA: Diagnosis not present

## 2020-02-12 DIAGNOSIS — Z681 Body mass index (BMI) 19 or less, adult: Secondary | ICD-10-CM | POA: Diagnosis not present

## 2020-02-12 DIAGNOSIS — Z23 Encounter for immunization: Secondary | ICD-10-CM | POA: Diagnosis not present

## 2020-02-12 DIAGNOSIS — K2100 Gastro-esophageal reflux disease with esophagitis, without bleeding: Secondary | ICD-10-CM | POA: Diagnosis not present

## 2020-02-14 DIAGNOSIS — I700 Atherosclerosis of aorta: Secondary | ICD-10-CM | POA: Diagnosis not present

## 2020-02-14 DIAGNOSIS — Z886 Allergy status to analgesic agent status: Secondary | ICD-10-CM | POA: Diagnosis not present

## 2020-02-14 DIAGNOSIS — Z5321 Procedure and treatment not carried out due to patient leaving prior to being seen by health care provider: Secondary | ICD-10-CM | POA: Diagnosis not present

## 2020-02-14 DIAGNOSIS — R109 Unspecified abdominal pain: Secondary | ICD-10-CM | POA: Diagnosis not present

## 2020-02-14 DIAGNOSIS — K5900 Constipation, unspecified: Secondary | ICD-10-CM | POA: Diagnosis not present

## 2020-02-16 DIAGNOSIS — K2100 Gastro-esophageal reflux disease with esophagitis, without bleeding: Secondary | ICD-10-CM | POA: Diagnosis not present

## 2020-02-16 DIAGNOSIS — Z681 Body mass index (BMI) 19 or less, adult: Secondary | ICD-10-CM | POA: Diagnosis not present

## 2020-02-16 DIAGNOSIS — K5900 Constipation, unspecified: Secondary | ICD-10-CM | POA: Diagnosis not present

## 2020-02-16 DIAGNOSIS — R110 Nausea: Secondary | ICD-10-CM | POA: Diagnosis not present

## 2020-03-01 DIAGNOSIS — R109 Unspecified abdominal pain: Secondary | ICD-10-CM | POA: Diagnosis not present

## 2020-03-01 DIAGNOSIS — Z681 Body mass index (BMI) 19 or less, adult: Secondary | ICD-10-CM | POA: Diagnosis not present

## 2020-03-05 ENCOUNTER — Encounter (INDEPENDENT_AMBULATORY_CARE_PROVIDER_SITE_OTHER): Payer: Medicare Other | Admitting: Ophthalmology

## 2020-03-07 ENCOUNTER — Inpatient Hospital Stay (HOSPITAL_COMMUNITY)
Admission: EM | Admit: 2020-03-07 | Discharge: 2020-03-11 | DRG: 389 | Disposition: A | Discharge status: Home / Self Care | Payer: Medicare Other | Attending: Family Medicine | Admitting: Family Medicine

## 2020-03-07 ENCOUNTER — Encounter (HOSPITAL_COMMUNITY): Payer: Self-pay | Admitting: Emergency Medicine

## 2020-03-07 ENCOUNTER — Emergency Department (HOSPITAL_COMMUNITY): Payer: Medicare Other

## 2020-03-07 ENCOUNTER — Other Ambulatory Visit: Payer: Self-pay

## 2020-03-07 DIAGNOSIS — R109 Unspecified abdominal pain: Secondary | ICD-10-CM | POA: Diagnosis not present

## 2020-03-07 DIAGNOSIS — K219 Gastro-esophageal reflux disease without esophagitis: Secondary | ICD-10-CM | POA: Diagnosis not present

## 2020-03-07 DIAGNOSIS — Z20822 Contact with and (suspected) exposure to covid-19: Secondary | ICD-10-CM | POA: Diagnosis present

## 2020-03-07 DIAGNOSIS — K567 Ileus, unspecified: Secondary | ICD-10-CM

## 2020-03-07 DIAGNOSIS — M160 Bilateral primary osteoarthritis of hip: Secondary | ICD-10-CM | POA: Diagnosis not present

## 2020-03-07 DIAGNOSIS — K6389 Other specified diseases of intestine: Secondary | ICD-10-CM | POA: Diagnosis not present

## 2020-03-07 DIAGNOSIS — I10 Essential (primary) hypertension: Secondary | ICD-10-CM | POA: Diagnosis not present

## 2020-03-07 DIAGNOSIS — R5381 Other malaise: Secondary | ICD-10-CM | POA: Diagnosis not present

## 2020-03-07 DIAGNOSIS — Z79899 Other long term (current) drug therapy: Secondary | ICD-10-CM

## 2020-03-07 DIAGNOSIS — R079 Chest pain, unspecified: Secondary | ICD-10-CM | POA: Diagnosis not present

## 2020-03-07 DIAGNOSIS — K5900 Constipation, unspecified: Secondary | ICD-10-CM | POA: Diagnosis not present

## 2020-03-07 DIAGNOSIS — N183 Chronic kidney disease, stage 3 unspecified: Secondary | ICD-10-CM | POA: Diagnosis present

## 2020-03-07 DIAGNOSIS — F17228 Nicotine dependence, chewing tobacco, with other nicotine-induced disorders: Secondary | ICD-10-CM | POA: Diagnosis not present

## 2020-03-07 DIAGNOSIS — E871 Hypo-osmolality and hyponatremia: Secondary | ICD-10-CM | POA: Diagnosis present

## 2020-03-07 DIAGNOSIS — Z87821 Personal history of retained foreign body fully removed: Secondary | ICD-10-CM | POA: Diagnosis not present

## 2020-03-07 DIAGNOSIS — I700 Atherosclerosis of aorta: Secondary | ICD-10-CM | POA: Diagnosis not present

## 2020-03-07 DIAGNOSIS — R140 Abdominal distension (gaseous): Secondary | ICD-10-CM | POA: Diagnosis not present

## 2020-03-07 DIAGNOSIS — F17220 Nicotine dependence, chewing tobacco, uncomplicated: Secondary | ICD-10-CM | POA: Diagnosis not present

## 2020-03-07 DIAGNOSIS — N1832 Chronic kidney disease, stage 3b: Secondary | ICD-10-CM | POA: Diagnosis present

## 2020-03-07 DIAGNOSIS — I129 Hypertensive chronic kidney disease with stage 1 through stage 4 chronic kidney disease, or unspecified chronic kidney disease: Secondary | ICD-10-CM | POA: Diagnosis not present

## 2020-03-07 DIAGNOSIS — H9190 Unspecified hearing loss, unspecified ear: Secondary | ICD-10-CM | POA: Diagnosis present

## 2020-03-07 DIAGNOSIS — M109 Gout, unspecified: Secondary | ICD-10-CM | POA: Diagnosis not present

## 2020-03-07 DIAGNOSIS — Z8249 Family history of ischemic heart disease and other diseases of the circulatory system: Secondary | ICD-10-CM

## 2020-03-07 DIAGNOSIS — N1831 Chronic kidney disease, stage 3a: Secondary | ICD-10-CM | POA: Diagnosis not present

## 2020-03-07 DIAGNOSIS — Z66 Do not resuscitate: Secondary | ICD-10-CM | POA: Diagnosis not present

## 2020-03-07 DIAGNOSIS — K5909 Other constipation: Secondary | ICD-10-CM | POA: Diagnosis not present

## 2020-03-07 DIAGNOSIS — E1122 Type 2 diabetes mellitus with diabetic chronic kidney disease: Secondary | ICD-10-CM | POA: Diagnosis present

## 2020-03-07 HISTORY — DX: Ileus, unspecified: K56.7

## 2020-03-07 LAB — CBC WITH DIFFERENTIAL/PLATELET
Abs Immature Granulocytes: 0.02 10*3/uL (ref 0.00–0.07)
Basophils Absolute: 0.1 10*3/uL (ref 0.0–0.1)
Basophils Relative: 1 %
Eosinophils Absolute: 0 10*3/uL (ref 0.0–0.5)
Eosinophils Relative: 0 %
HCT: 42.3 % (ref 39.0–52.0)
Hemoglobin: 13.9 g/dL (ref 13.0–17.0)
Immature Granulocytes: 0 %
Lymphocytes Relative: 20 %
Lymphs Abs: 1.7 10*3/uL (ref 0.7–4.0)
MCH: 30 pg (ref 26.0–34.0)
MCHC: 32.9 g/dL (ref 30.0–36.0)
MCV: 91.2 fL (ref 80.0–100.0)
Monocytes Absolute: 0.7 10*3/uL (ref 0.1–1.0)
Monocytes Relative: 9 %
Neutro Abs: 6 10*3/uL (ref 1.7–7.7)
Neutrophils Relative %: 70 %
Platelets: 293 10*3/uL (ref 150–400)
RBC: 4.64 MIL/uL (ref 4.22–5.81)
RDW: 12.7 % (ref 11.5–15.5)
WBC: 8.4 10*3/uL (ref 4.0–10.5)
nRBC: 0 % (ref 0.0–0.2)

## 2020-03-07 LAB — COMPREHENSIVE METABOLIC PANEL
ALT: 16 U/L (ref 0–44)
AST: 24 U/L (ref 15–41)
Albumin: 4.3 g/dL (ref 3.5–5.0)
Alkaline Phosphatase: 111 U/L (ref 38–126)
Anion gap: 14 (ref 5–15)
BUN: 22 mg/dL (ref 8–23)
CO2: 24 mmol/L (ref 22–32)
Calcium: 9.8 mg/dL (ref 8.9–10.3)
Chloride: 92 mmol/L — ABNORMAL LOW (ref 98–111)
Creatinine, Ser: 1.42 mg/dL — ABNORMAL HIGH (ref 0.61–1.24)
GFR, Estimated: 42 mL/min — ABNORMAL LOW (ref >60)
Glucose, Bld: 98 mg/dL (ref 70–99)
Potassium: 4.9 mmol/L (ref 3.5–5.1)
Sodium: 130 mmol/L — ABNORMAL LOW (ref 135–145)
Total Bilirubin: 1.2 mg/dL (ref 0.3–1.2)
Total Protein: 8.3 g/dL — ABNORMAL HIGH (ref 6.5–8.1)

## 2020-03-07 LAB — TROPONIN I (HIGH SENSITIVITY): Troponin I (High Sensitivity): 16 ng/L (ref <18)

## 2020-03-07 MED ORDER — IOHEXOL 300 MG/ML  SOLN
100.0000 mL | Freq: Once | INTRAMUSCULAR | Status: AC | PRN
  Administered 2020-03-07: 100 mL via INTRAVENOUS

## 2020-03-07 NOTE — ED Triage Notes (Addendum)
Patient reports foreign body in rectum. Per patient has had constipation and did fleets enema in which he states didn't work. Patient states he then tried to make his on soaps suds enema with a calf bottle and nipple. Per patient nipple to bottle came off and is in rectum. Per patient this was x1 month ago. Patient states that he went to [facility] x2 weeks ago and had x-ray and another soaps sud enema. Patient states Maryruth Bun stated no nipple was seen in rectum but he believes that it is further in his rectum. Patient was told that he had impaction at [facility] but was not disimpacted there. Patient states he is now dizzy with generalized weakness. Patient also reports right arm weakness with dizziness this morning for approxmately 5 minutes today but denies any at this time. Patient also states change in vision x2 weeks.

## 2020-03-07 NOTE — H&P (Addendum)
TRH H&P    Patient Demographics:    Hector Lowe, is a 86 y.o. male  MRN: [id number]  DOB - 08/04/1921  Admit Date - 03/07/2020  Referring MD/NP/PA: Lincoln Maxin  Outpatient Primary MD for the patient is Richardean Chimera, MD  Patient coming from: Home  Chief complaint- Foreign body in rectum   HPI:    Hector Lowe  is a 86 y.o. male with history of hypertension and acid reflux presents to the ED with a chief complaint of constipation.  Patient reports that this all has been going on for 5 weeks.  He was constipated for several days in the beginning feeling nauseated and having no appetite.  He was only able to drink liquids like Ensure and water.  He tried to make his own soapsuds enema with the nipple from a half bottle and thought that he lost the nipple inside of his rectum.  He went to [facility] they did an x-ray and told him there was no foreign body in the rectum and advised him to follow-up with GI.  Patient has not been able to follow-up with GI because he has not been able to reach them.  He went home and continued enemas and he reports that at some point within this 5 weeks he did have rather large bowel movement, it was nonbloody, was soft.  He felt good for couple of days, and then he became constipated again.  Patient reports that this time the constipation is associated with severe right-sided cramping pain and gurgling in his abdomen.  He still nauseous, but he has not vomited.  He is passing flatus, and also belching a lot.  Patient reports he still not eating solid foods because he has no appetite.  Patient reports that this has happened once before years ago. At that time, he had a "therapeutic colonoscopy."  In the ED Temperature 98.3, heart rate 65, respiratory rate 16, blood pressure 175/75, 100% on room air White blood cell count 8.4 CHEM panel unremarkable with the exception of BUN 22 and  creatinine 1.42 -these are very close to his baseline but slightly elevated Glucose 98 X-ray abdomen showed mild diffuse gaseous distention of bowel moderate stool burden in right colon. CT abdomen showed mild prominence of gas-filled colon without wall thickening, likely ileus.  No evidence of bowel obstruction.  Free fluid in the pelvis and is likely reactive.  Enlarged prostate gland.  Other chronic changes. Admission for bowel rest, IV hydration, and to establish with GI    Review of systems:    In addition to the HPI above,  No Fever-chills, No Headache, No changes with Vision or hearing, No problems swallowing food or Liquids, No Chest pain, Cough or Shortness of Breath, Admits to abdominal pain, nausea, no vomiting, constipation No Blood in stool or Urine, No dysuria, No new skin rashes or bruises, No new joints pains-aches,  No new weakness, tingling, numbness in any extremity, No recent weight gain or loss, No polyuria, polydypsia or polyphagia, No significant Mental Stressors.  All other  systems reviewed and are negative.    Past History of the following :    Past Medical History:  Diagnosis Date  . Acid reflux   . Hypertension       Past Surgical History:  Procedure Laterality Date  . EYE SURGERY    . HERNIA REPAIR        Social History:      Social History   Tobacco Use  . Smoking status: Never Smoker  . Smokeless tobacco: Current User    Types: Chew  Substance Use Topics  . Alcohol use: No       Family History :    History reviewed. No pertinent family history. Family history of HTN   Home Medications:   Prior to Admission medications   Medication Sig Start Date End Date Taking? Authorizing Provider  calcium carbonate (TUMS - DOSED IN MG ELEMENTAL CALCIUM) 500 MG chewable tablet Chew 1 tablet by mouth daily as needed for indigestion or heartburn.   Yes [provider]  ibuprofen (ADVIL,MOTRIN) 200 MG tablet Take 200 mg by mouth  daily as needed for mild pain or moderate pain.   Yes [provider]  lisinopril (PRINIVIL,ZESTRIL) 10 MG tablet Take 10 mg by mouth daily.     Yes [provider]  omeprazole (PRILOSEC) 20 MG capsule Take 20 mg by mouth daily.     Yes [provider]     Allergies:    No Known Allergies   Physical Exam:   Vitals  Blood pressure (!) 175/75, pulse 65, temperature 98.4 F (36.9 C), temperature source Oral, resp. rate 16, height 5\' 7"  (1.702 m), weight 53.1 kg, SpO2 100 %.  1.  General: Sitting up in chair in no acute distress  2. Psychiatric: Mood and behavior normal for situation, alert and oriented x3  3. Neurologic: Cranial nerves II through XII grossly intact, moves all four extremities voluntarily  4. HEENMT:  Head is atraumatic, normocephalic, neck is supple, trachea is midline, mucous membranes are moist, very hard of hearing even with hearing aids in  5. Respiratory : Lungs clear to auscultation bilaterally  6. Cardiovascular : Heart rate is normal, rhythm is regular, no murmurs rubs or gallops  7. Gastrointestinal:  Abdomen is soft, slightly distended, tender to palpation mostly in the right lower quadrant but also some in the lower center, and left lower quadrant  8. Skin:  No acute lesions on limited skin exam  9.Musculoskeletal:  No peripheral edema or acute deformity    Data Review:    CBC Recent Labs  Lab 03/07/20 1900  WBC 8.4  HGB 13.9  HCT 42.3  PLT 293  MCV 91.2  MCH 30.0  MCHC 32.9  RDW 12.7  LYMPHSABS 1.7  MONOABS 0.7  EOSABS 0.0  BASOSABS 0.1   ------------------------------------------------------------------------------------------------------------------  Results for orders placed or performed during the hospital encounter of 03/07/20 (from the past 48 hour(s))  CBC with Differential/Platelet     Status: None   Collection Time: 03/07/20  7:00 PM  Result Value Ref Range   WBC 8.4 4.0 - 10.5 K/uL    RBC 4.64 4.22 - 5.81 MIL/uL   Hemoglobin 13.9 13.0 - 17.0 g/dL   HCT 03/09/20 39 - 52 %   MCV 91.2 80.0 - 100.0 fL   MCH 30.0 26.0 - 34.0 pg   MCHC 32.9 30.0 - 36.0 g/dL   RDW 16.9 67.8 - 93.8 %   Platelets 293 150 - 400 K/uL  nRBC 0.0 0.0 - 0.2 %   Neutrophils Relative % 70 %   Neutro Abs 6.0 1.7 - 7.7 K/uL   Lymphocytes Relative 20 %   Lymphs Abs 1.7 0.7 - 4.0 K/uL   Monocytes Relative 9 %   Monocytes Absolute 0.7 0.1 - 1.0 K/uL   Eosinophils Relative 0 %   Eosinophils Absolute 0.0 0.0 - 0.5 K/uL   Basophils Relative 1 %   Basophils Absolute 0.1 0.0 - 0.1 K/uL   Immature Granulocytes 0 %   Abs Immature Granulocytes 0.02 0.00 - 0.07 K/uL    Comment: Performed at [facility], [address] [place], [place] [address]  Comprehensive metabolic panel     Status: Abnormal   Collection Time: 03/07/20  7:00 PM  Result Value Ref Range   Sodium 130 (L) 135 - 145 mmol/L   Potassium 4.9 3.5 - 5.1 mmol/L   Chloride 92 (L) 98 - 111 mmol/L   CO2 24 22 - 32 mmol/L   Glucose, Bld 98 70 - 99 mg/dL    Comment: Glucose reference range applies only to samples taken after fasting for at least 8 hours.   BUN 22 8 - 23 mg/dL   Creatinine, Ser 2.86 (H) 0.61 - 1.24 mg/dL   Calcium 9.8 8.9 - 38.1 mg/dL   Total Protein 8.3 (H) 6.5 - 8.1 g/dL   Albumin 4.3 3.5 - 5.0 g/dL   AST 24 15 - 41 U/L   ALT 16 0 - 44 U/L   Alkaline Phosphatase 111 38 - 126 U/L   Total Bilirubin 1.2 0.3 - 1.2 mg/dL   GFR, Estimated 42 (L) >60 mL/min   Anion gap 14 5 - 15    Comment: Performed at [facility], [address], [place], [place] [address]  Troponin I (High Sensitivity)     Status: None   Collection Time: 03/07/20  7:00 PM  Result Value Ref Range   Troponin I (High Sensitivity) 16 <18 ng/L    Comment: (NOTE) Elevated high sensitivity troponin I (hsTnI) values and significant  changes across serial measurements may suggest ACS but many other  chronic and acute conditions are known to elevate hsTnI results.   Refer to the "Links" section for chest pain algorithms and additional  guidance. Performed at [facility], [address], [place], [place] [address]     Chemistries  Recent Labs  Lab 03/07/20 1900  NA 130*  K 4.9  CL 92*  CO2 24  GLUCOSE 98  BUN 22  CREATININE 1.42*  CALCIUM 9.8  AST 24  ALT 16  ALKPHOS 111  BILITOT 1.2   ------------------------------------------------------------------------------------------------------------------  ------------------------------------------------------------------------------------------------------------------ GFR: Estimated Creatinine Clearance: 23.4 mL/min (A) (by C-G formula based on SCr of 1.42 mg/dL (H)). Liver Function Tests: Recent Labs  Lab 03/07/20 1900  AST 24  ALT 16  ALKPHOS 111  BILITOT 1.2  PROT 8.3*  ALBUMIN 4.3   No results for input(s): LIPASE, AMYLASE in the last 168 hours. No results for input(s): AMMONIA in the last 168 hours. Coagulation Profile: No results for input(s): INR, PROTIME in the last 168 hours. Cardiac Enzymes: No results for input(s): CKTOTAL, CKMB, CKMBINDEX, TROPONINI in the last 168 hours. BNP (last 3 results) No results for input(s): PROBNP in the last 8760 hours. HbA1C: No results for input(s): HGBA1C in the last 72 hours. CBG: No results for input(s): GLUCAP in the last 168 hours. Lipid Profile: No results for input(s): CHOL, HDL, LDLCALC, TRIG, CHOLHDL, LDLDIRECT in the  last 72 hours. Thyroid Function Tests: No results for input(s): TSH, T4TOTAL, FREET4, T3FREE, THYROIDAB in the last 72 hours. Anemia Panel: No results for input(s): VITAMINB12, FOLATE, FERRITIN, TIBC, IRON, RETICCTPCT in the last 72 hours.  --------------------------------------------------------------------------------------------------------------- Urine analysis: No results found for: COLORURINE, APPEARANCEUR, LABSPEC, PHURINE, GLUCOSEU, HGBUR, BILIRUBINUR, KETONESUR, PROTEINUR, UROBILINOGEN, NITRITE,  LEUKOCYTESUR    Imaging Results:    CT ABDOMEN PELVIS W CONTRAST  Result Date: 03/07/2020 CLINICAL DATA:  Abdominal distention. Severe lower and mid abdominal pain and cramping. EXAM: CT ABDOMEN AND PELVIS WITH CONTRAST TECHNIQUE: Multidetector CT imaging of the abdomen and pelvis was performed using the standard protocol following bolus administration of intravenous contrast. CONTRAST:  100mL OMNIPAQUE IOHEXOL 300 MG/ML  SOLN COMPARISON:  Abdominal radiographs 03/07/2020. CT abdomen and pelvis 01/28/2020 FINDINGS: Lower chest: Mild peripheral interstitial changes, likely mild fibrosis. Hepatobiliary: No focal liver abnormality is seen. No gallstones, gallbladder wall thickening, or biliary dilatation. Pancreas: Unremarkable. No pancreatic ductal dilatation or surrounding inflammatory changes. Spleen: Normal in size without focal abnormality. Adrenals/Urinary Tract: Adrenal glands are unremarkable. Kidneys are normal, without renal calculi, focal lesion, or hydronephrosis. Bladder is unremarkable. Stomach/Bowel: Mild prominence of gas-filled colon without wall thickening. Likely ileus. Stomach and small bowel are not abnormally distended. Appendix is normal. Vascular/Lymphatic: Aortic atherosclerosis. No enlarged abdominal or pelvic lymph nodes. Reproductive: Prostate gland is enlarged. Other: Small amount of free fluid in the pelvis is likely reactive. No free air. Abdominal wall musculature appears intact. Postoperative changes in the groins consistent with previous hernia repair. Musculoskeletal: Lumbar scoliosis convex towards the left. Degenerative changes in the lumbar spine and hips. No destructive bone lesions. IMPRESSION: 1. Mild prominence of gas-filled colon without wall thickening. Likely ileus. No evidence of bowel obstruction. 2. Small amount of free fluid in the pelvis is likely reactive. 3. Prostate gland is enlarged. 4. Aortic atherosclerosis. 5. Mild peripheral interstitial lung changes,  likely fibrosis. Aortic Atherosclerosis (ICD10-I70.0). Electronically Signed   By: Burman NievesWilliam  Stevens M.D.   On: 03/07/2020 22:17   DG ABD ACUTE 2+V W 1V CHEST  Result Date: 03/07/2020 CLINICAL DATA:  Chest pain.  Constipation.  Foreign body in rectum. EXAM: DG ABDOMEN ACUTE W/ 1V CHEST COMPARISON:  02/14/2020 FINDINGS: Heart is normal size.  No confluent opacities or effusions. Mild diffuse gaseous distention of bowel. No evidence of bowel obstruction. Moderate stool burden in the right colon. No organomegaly, free air, suspicious calcification or visible radiopaque foreign body. IMPRESSION: Mild diffuse gaseous distention of bowel. Moderate stool burden in the right colon. No acute cardiopulmonary disease. Electronically Signed   By: Charlett NoseKevin  Dover M.D.   On: 03/07/2020 18:40    My personal review of EKG: Rhythm NSR, Rate 65 /min, QTc 439,no Acute ST changes   Assessment & Plan:    Active Problems:   Ileus (HCC)   1. Ileus 1. Bowel rest 2. IV hydration 3. Consult GI 4. Check TSH 2. HTN 1. BP 175/75 2. Continue lisinopril 3. Continue to monitor 3. GERD 1. Hold PPI given constipation as a known side effect 4. Nicotine dependence 1. Reports that he chews, but does not want a nicotine patch at this time    DVT Prophylaxis-   heparin - SCDs   AM Labs Ordered, also please review Full Orders  Family Communication: No family at bedside  Code Status:  Full  Admission status: Observation  Time spent in minutes : 64   Amdrew Oboyle B Zierle-Ghosh DO

## 2020-03-07 NOTE — ED Provider Notes (Signed)
[facility] EMERGENCY DEPARTMENT Provider Note   CSN: [id number] Arrival date & time: 03/07/20  1257     History Chief Complaint  Patient presents with  . Foreign Body in Rectum    Hector Lowe is a 86 y.o. male with a past medical history of hypertension and acid reflux disease, also macular degeneration, presenting for evaluation of persistent constipation and abdominal pain.  He also endorses abdominal distention.  He believes he may have a foreign body in his rectum, although denies rectal pain.  Approximately 1 month ago he tried to do a fleets enema at home using a half bottle and nipple.  The nipple fell off the bottle and is unsure if it fell in the toilet or if it is in his rectum.  He was seen for the same complaint at [facility] twice last month, CT imaging was negative for foreign body.  He reports continued inability to have a bowel movement and eating currently makes his abdominal pain worse.  He has had multiple medications at home for this including MiraLAX and other medications which he cannot name.  He denies fevers or chills, no vomiting.   He also notes having an episode of dizziness and generalized weakness this morning which lasted about 5 minutes.  He laid down on his sofa and felt better quickly.  He denies chest pain or shortness of breath.  He does have episodes of chest pressure which are nonexertional but states it resolves with eructation.  HPI     Past Medical History:  Diagnosis Date  . Acid reflux   . Hypertension     Patient Active Problem List   Diagnosis Date Noted  . Ileus (HCC) 03/07/2020    Past Surgical History:  Procedure Laterality Date  . EYE SURGERY    . HERNIA REPAIR         History reviewed. No pertinent family history.  Social History   Tobacco Use  . Smoking status: Never Smoker  . Smokeless tobacco: Current User    Types: Chew  Vaping Use  . Vaping Use: Never used  Substance Use Topics  . Alcohol use: No  .  Drug use: No    Home Medications Prior to Admission medications   Medication Sig Start Date End Date Taking? Authorizing Provider  calcium carbonate (TUMS - DOSED IN MG ELEMENTAL CALCIUM) 500 MG chewable tablet Chew 1 tablet by mouth daily as needed for indigestion or heartburn.   Yes [provider]  ibuprofen (ADVIL,MOTRIN) 200 MG tablet Take 200 mg by mouth daily as needed for mild pain or moderate pain.   Yes [provider]  lisinopril (PRINIVIL,ZESTRIL) 10 MG tablet Take 10 mg by mouth daily.     Yes [provider]  omeprazole (PRILOSEC) 20 MG capsule Take 20 mg by mouth daily.     Yes [provider]    Allergies    Patient has no known allergies.  Review of Systems   Review of Systems  Constitutional: Positive for appetite change. Negative for chills and fever.  HENT: Negative for congestion.   Eyes: Negative.   Respiratory: Negative for chest tightness and shortness of breath.   Cardiovascular: Negative for chest pain.  Gastrointestinal: Positive for abdominal distention, abdominal pain, constipation and nausea. Negative for vomiting.  Genitourinary: Negative.   Musculoskeletal: Negative for arthralgias, joint swelling and neck pain.  Skin: Negative.  Negative for rash and wound.  Neurological: Negative for light-headedness, numbness and headaches.  Psychiatric/Behavioral:  Negative.   All other systems reviewed and are negative.   Physical Exam Updated Vital Signs BP (!) 175/75 (BP Location: Right Arm)   Pulse 65   Temp 98.4 F (36.9 C) (Oral)   Resp 16   Ht 5\' 7"  (1.702 m)   Wt 53.1 kg   SpO2 100%   BMI 18.32 kg/m   Physical Exam Vitals and nursing note reviewed.  Constitutional:      Appearance: He is well-developed.  HENT:     Head: Normocephalic and atraumatic.     Mouth/Throat:     Mouth: Mucous membranes are moist.  Eyes:     Conjunctiva/sclera: Conjunctivae normal.  Cardiovascular:     Rate and Rhythm: Normal  rate and regular rhythm.     Heart sounds: Normal heart sounds.  Pulmonary:     Effort: Pulmonary effort is normal.     Breath sounds: Normal breath sounds. No wheezing.  Abdominal:     General: Bowel sounds are normal. There is distension.     Palpations: Abdomen is soft.     Tenderness: There is abdominal tenderness. There is no guarding or rebound.     Comments: Generalized abdominal pain without guarding.  Increased tympany to percussion.   Musculoskeletal:        General: Normal range of motion.     Cervical back: Normal range of motion.  Skin:    General: Skin is warm and dry.  Neurological:     Mental Status: He is alert.     ED Results / Procedures / Treatments   Labs (all labs ordered are listed, but only abnormal results are displayed) Labs Reviewed  COMPREHENSIVE METABOLIC PANEL - Abnormal; Notable for the following components:      Result Value   Sodium 130 (*)    Chloride 92 (*)    Creatinine, Ser 1.42 (*)    Total Protein 8.3 (*)    GFR, Estimated 42 (*)    All other components within normal limits  RESP PANEL BY RT PCR (RSV, FLU A&B, COVID)  CBC WITH DIFFERENTIAL/PLATELET  TROPONIN I (HIGH SENSITIVITY)    EKG EKG Interpretation  Date/Time:  Sunday March 07 2020 14:15:34 EDT Ventricular Rate:  62 PR Interval:  198 QRS Duration: 78 QT Interval:  366 QTC Calculation: 371 R Axis:   -14 Text Interpretation: Normal sinus rhythm similar to May 2019 Confirmed by June 2019 [phone]) on 03/07/2020 6:19:50 PM   Radiology CT ABDOMEN PELVIS W CONTRAST  Result Date: 03/07/2020 CLINICAL DATA:  Abdominal distention. Severe lower and mid abdominal pain and cramping. EXAM: CT ABDOMEN AND PELVIS WITH CONTRAST TECHNIQUE: Multidetector CT imaging of the abdomen and pelvis was performed using the standard protocol following bolus administration of intravenous contrast. CONTRAST:  03/09/2020 OMNIPAQUE IOHEXOL 300 MG/ML  SOLN COMPARISON:  Abdominal radiographs  03/07/2020. CT abdomen and pelvis 01/28/2020 FINDINGS: Lower chest: Mild peripheral interstitial changes, likely mild fibrosis. Hepatobiliary: No focal liver abnormality is seen. No gallstones, gallbladder wall thickening, or biliary dilatation. Pancreas: Unremarkable. No pancreatic ductal dilatation or surrounding inflammatory changes. Spleen: Normal in size without focal abnormality. Adrenals/Urinary Tract: Adrenal glands are unremarkable. Kidneys are normal, without renal calculi, focal lesion, or hydronephrosis. Bladder is unremarkable. Stomach/Bowel: Mild prominence of gas-filled colon without wall thickening. Likely ileus. Stomach and small bowel are not abnormally distended. Appendix is normal. Vascular/Lymphatic: Aortic atherosclerosis. No enlarged abdominal or pelvic lymph nodes. Reproductive: Prostate gland is enlarged. Other: Small amount of free fluid in the pelvis is  likely reactive. No free air. Abdominal wall musculature appears intact. Postoperative changes in the groins consistent with previous hernia repair. Musculoskeletal: Lumbar scoliosis convex towards the left. Degenerative changes in the lumbar spine and hips. No destructive bone lesions. IMPRESSION: 1. Mild prominence of gas-filled colon without wall thickening. Likely ileus. No evidence of bowel obstruction. 2. Small amount of free fluid in the pelvis is likely reactive. 3. Prostate gland is enlarged. 4. Aortic atherosclerosis. 5. Mild peripheral interstitial lung changes, likely fibrosis. Aortic Atherosclerosis (ICD10-I70.0). Electronically Signed   By: Burman Nieves M.D.   On: 03/07/2020 22:17   DG ABD ACUTE 2+V W 1V CHEST  Result Date: 03/07/2020 CLINICAL DATA:  Chest pain.  Constipation.  Foreign body in rectum. EXAM: DG ABDOMEN ACUTE W/ 1V CHEST COMPARISON:  02/14/2020 FINDINGS: Heart is normal size.  No confluent opacities or effusions. Mild diffuse gaseous distention of bowel. No evidence of bowel obstruction. Moderate stool  burden in the right colon. No organomegaly, free air, suspicious calcification or visible radiopaque foreign body. IMPRESSION: Mild diffuse gaseous distention of bowel. Moderate stool burden in the right colon. No acute cardiopulmonary disease. Electronically Signed   By: Charlett Nose M.D.   On: 03/07/2020 18:40    Procedures Procedures (including critical care time)  Medications Ordered in ED Medications  iohexol (OMNIPAQUE) 300 MG/ML solution 100 mL (100 mLs Intravenous Contrast Given 03/07/20 2152)    ED Course  I have reviewed the triage vital signs and the nursing notes.  Pertinent labs & imaging results that were available during my care of the patient were reviewed by me and considered in my medical decision making (see chart for details).    MDM Rules/Calculators/A&P                          Patient with a 1 month history of constipation, ileus found on CT imaging this evening.  He has had decreased appetite and increasing nausea with attempts at eating.  He would benefit from inpatient admission for bowel rest, consideration of GI consult.  Discussed with Dr. Carren Rang who accepts pt for admission. Final Clinical Impression(s) / ED Diagnoses Final diagnoses:  Ileus [facility])    Rx / DC Orders ED Discharge Orders    None       Victoriano Lain 03/07/20 2336    Pricilla Loveless, MD 03/08/20 [phone]

## 2020-03-08 ENCOUNTER — Encounter (HOSPITAL_COMMUNITY): Payer: Self-pay | Admitting: Family Medicine

## 2020-03-08 ENCOUNTER — Observation Stay (HOSPITAL_COMMUNITY): Payer: Medicare Other

## 2020-03-08 DIAGNOSIS — Z79899 Other long term (current) drug therapy: Secondary | ICD-10-CM | POA: Diagnosis not present

## 2020-03-08 DIAGNOSIS — I10 Essential (primary) hypertension: Secondary | ICD-10-CM

## 2020-03-08 DIAGNOSIS — M160 Bilateral primary osteoarthritis of hip: Secondary | ICD-10-CM | POA: Diagnosis not present

## 2020-03-08 DIAGNOSIS — N1832 Chronic kidney disease, stage 3b: Secondary | ICD-10-CM | POA: Diagnosis present

## 2020-03-08 DIAGNOSIS — R109 Unspecified abdominal pain: Secondary | ICD-10-CM | POA: Diagnosis not present

## 2020-03-08 DIAGNOSIS — K5900 Constipation, unspecified: Secondary | ICD-10-CM

## 2020-03-08 DIAGNOSIS — I129 Hypertensive chronic kidney disease with stage 1 through stage 4 chronic kidney disease, or unspecified chronic kidney disease: Secondary | ICD-10-CM | POA: Diagnosis present

## 2020-03-08 DIAGNOSIS — K5909 Other constipation: Secondary | ICD-10-CM | POA: Diagnosis present

## 2020-03-08 DIAGNOSIS — F17228 Nicotine dependence, chewing tobacco, with other nicotine-induced disorders: Secondary | ICD-10-CM

## 2020-03-08 DIAGNOSIS — E871 Hypo-osmolality and hyponatremia: Secondary | ICD-10-CM

## 2020-03-08 DIAGNOSIS — R5381 Other malaise: Secondary | ICD-10-CM | POA: Diagnosis present

## 2020-03-08 DIAGNOSIS — N183 Chronic kidney disease, stage 3 unspecified: Secondary | ICD-10-CM | POA: Diagnosis present

## 2020-03-08 DIAGNOSIS — N1831 Chronic kidney disease, stage 3a: Secondary | ICD-10-CM

## 2020-03-08 DIAGNOSIS — Z66 Do not resuscitate: Secondary | ICD-10-CM | POA: Diagnosis present

## 2020-03-08 DIAGNOSIS — K6389 Other specified diseases of intestine: Secondary | ICD-10-CM | POA: Diagnosis not present

## 2020-03-08 DIAGNOSIS — Z8249 Family history of ischemic heart disease and other diseases of the circulatory system: Secondary | ICD-10-CM | POA: Diagnosis not present

## 2020-03-08 DIAGNOSIS — K219 Gastro-esophageal reflux disease without esophagitis: Secondary | ICD-10-CM

## 2020-03-08 DIAGNOSIS — E1122 Type 2 diabetes mellitus with diabetic chronic kidney disease: Secondary | ICD-10-CM | POA: Diagnosis present

## 2020-03-08 DIAGNOSIS — H9190 Unspecified hearing loss, unspecified ear: Secondary | ICD-10-CM | POA: Diagnosis present

## 2020-03-08 DIAGNOSIS — K567 Ileus, unspecified: Secondary | ICD-10-CM | POA: Diagnosis not present

## 2020-03-08 DIAGNOSIS — M109 Gout, unspecified: Secondary | ICD-10-CM | POA: Diagnosis not present

## 2020-03-08 DIAGNOSIS — Z20822 Contact with and (suspected) exposure to covid-19: Secondary | ICD-10-CM | POA: Diagnosis present

## 2020-03-08 DIAGNOSIS — F17220 Nicotine dependence, chewing tobacco, uncomplicated: Secondary | ICD-10-CM | POA: Diagnosis present

## 2020-03-08 HISTORY — DX: Nicotine dependence, chewing tobacco, with other nicotine-induced disorders: F17.228

## 2020-03-08 HISTORY — DX: Chronic kidney disease, stage 3 unspecified: N18.30

## 2020-03-08 HISTORY — DX: Constipation, unspecified: K59.00

## 2020-03-08 HISTORY — DX: Essential (primary) hypertension: I10

## 2020-03-08 HISTORY — DX: Gastro-esophageal reflux disease without esophagitis: K21.9

## 2020-03-08 HISTORY — DX: Hypo-osmolality and hyponatremia: E87.1

## 2020-03-08 LAB — RESP PANEL BY RT PCR (RSV, FLU A&B, COVID)
Influenza A by PCR: NEGATIVE
Influenza B by PCR: NEGATIVE
Respiratory Syncytial Virus by PCR: NEGATIVE
SARS Coronavirus 2 by RT PCR: NEGATIVE

## 2020-03-08 LAB — CBC
HCT: 37.2 % — ABNORMAL LOW (ref 39.0–52.0)
HCT: 36.6 % — ABNORMAL LOW (ref 39.0–52.0)
Hemoglobin: 12.2 g/dL — ABNORMAL LOW (ref 13.0–17.0)
Hemoglobin: 12.4 g/dL — ABNORMAL LOW (ref 13.0–17.0)
MCH: 30.2 pg (ref 26.0–34.0)
MCH: 30.2 pg (ref 26.0–34.0)
MCHC: 32.8 g/dL (ref 30.0–36.0)
MCHC: 33.9 g/dL (ref 30.0–36.0)
MCV: 92.1 fL (ref 80.0–100.0)
MCV: 89.3 fL (ref 80.0–100.0)
Platelets: 239 10*3/uL (ref 150–400)
Platelets: 234 10*3/uL (ref 150–400)
RBC: 4.04 MIL/uL — ABNORMAL LOW (ref 4.22–5.81)
RBC: 4.1 MIL/uL — ABNORMAL LOW (ref 4.22–5.81)
RDW: 12.9 % (ref 11.5–15.5)
RDW: 12.7 % (ref 11.5–15.5)
WBC: 6.7 10*3/uL (ref 4.0–10.5)
WBC: 6.6 10*3/uL (ref 4.0–10.5)
nRBC: 0 % (ref 0.0–0.2)
nRBC: 0 % (ref 0.0–0.2)

## 2020-03-08 LAB — COMPREHENSIVE METABOLIC PANEL
ALT: 13 U/L (ref 0–44)
AST: 20 U/L (ref 15–41)
Albumin: 3.3 g/dL — ABNORMAL LOW (ref 3.5–5.0)
Alkaline Phosphatase: 85 U/L (ref 38–126)
Anion gap: 11 (ref 5–15)
BUN: 22 mg/dL (ref 8–23)
CO2: 23 mmol/L (ref 22–32)
Calcium: 8.9 mg/dL (ref 8.9–10.3)
Chloride: 95 mmol/L — ABNORMAL LOW (ref 98–111)
Creatinine, Ser: 1.28 mg/dL — ABNORMAL HIGH (ref 0.61–1.24)
GFR, Estimated: 47 mL/min — ABNORMAL LOW (ref >60)
Glucose, Bld: 86 mg/dL (ref 70–99)
Potassium: 4.5 mmol/L (ref 3.5–5.1)
Sodium: 129 mmol/L — ABNORMAL LOW (ref 135–145)
Total Bilirubin: 1 mg/dL (ref 0.3–1.2)
Total Protein: 6.1 g/dL — ABNORMAL LOW (ref 6.5–8.1)

## 2020-03-08 LAB — MAGNESIUM: Magnesium: 2 mg/dL (ref 1.7–2.4)

## 2020-03-08 LAB — TSH: TSH: 2.833 u[IU]/mL (ref 0.350–4.500)

## 2020-03-08 MED ORDER — ACETAMINOPHEN 650 MG RE SUPP: 650.0000 mg | Freq: Four times a day (QID) | RECTAL | Status: DC | PRN

## 2020-03-08 MED ORDER — ACETAMINOPHEN 325 MG PO TABS
650.0000 mg | ORAL_TABLET | Freq: Four times a day (QID) | ORAL | Status: DC | PRN
  Administered 2020-03-09 (×2): 650 mg via ORAL
  Filled 2020-03-08 (×2): qty 2

## 2020-03-08 MED ORDER — SODIUM CHLORIDE 0.9 % IV SOLN: INTRAVENOUS | Status: AC

## 2020-03-08 MED ORDER — KETOROLAC TROMETHAMINE 15 MG/ML IJ SOLN: 15.0000 mg | Freq: Four times a day (QID) | INTRAMUSCULAR | Status: DC | PRN

## 2020-03-08 MED ORDER — ONDANSETRON HCL 4 MG PO TABS
4.0000 mg | ORAL_TABLET | Freq: Four times a day (QID) | ORAL | Status: DC | PRN
  Filled 2020-03-08: qty 1

## 2020-03-08 MED ORDER — HEPARIN SODIUM (PORCINE) 5000 UNIT/ML IJ SOLN
5000.0000 [IU] | Freq: Three times a day (TID) | INTRAMUSCULAR | Status: DC
  Administered 2020-03-08 – 2020-03-10 (×7): 5000 [IU] via SUBCUTANEOUS
  Filled 2020-03-08 (×8): qty 1

## 2020-03-08 MED ORDER — LISINOPRIL 10 MG PO TABS
10.0000 mg | ORAL_TABLET | Freq: Every day | ORAL | Status: DC
  Administered 2020-03-08 – 2020-03-10 (×3): 10 mg via ORAL
  Filled 2020-03-08 (×3): qty 1

## 2020-03-08 MED ORDER — ONDANSETRON HCL 4 MG/2ML IJ SOLN: 4.0000 mg | Freq: Four times a day (QID) | INTRAMUSCULAR | Status: DC | PRN

## 2020-03-08 MED ORDER — LINACLOTIDE 145 MCG PO CAPS
145.0000 ug | ORAL_CAPSULE | Freq: Every day | ORAL | Status: DC
  Administered 2020-03-08 – 2020-03-11 (×4): 145 ug via ORAL
  Filled 2020-03-08 (×4): qty 1

## 2020-03-08 NOTE — ED Notes (Signed)
ED TO INPATIENT HANDOFF REPORT  ED Nurse Name and Phone #:  [phone]  S Name/Age/Gender Hector Lowe 86 y.o. male Room/Bed: APA04/APA04  Code Status   Code Status: DNR  Home/SNF/Other Home Patient oriented to: self, place, time and situation Is this baseline? Yes   Triage Complete: Triage complete  Chief Complaint [facility]) [K56.7]  Triage Note Patient reports foreign body in rectum. Per patient has had constipation and did fleets enema in which he states didn't work. Patient states he then tried to make his on soaps suds enema with a calf bottle and nipple. Per patient nipple to bottle came off and is in rectum. Per patient this was x1 month ago. Patient states that he went to [facility] x2 weeks ago and had x-ray and another soaps sud enema. Patient states Maryruth Bun stated no nipple was seen in rectum but he believes that it is further in his rectum. Patient was told that he had impaction at [facility] but was not disimpacted there. Patient states he is now dizzy with generalized weakness. Patient also reports right arm weakness with dizziness this morning for approxmately 5 minutes today but denies any at this time. Patient also states change in vision x2 weeks.     Allergies No Known Allergies  Level of Care/Admitting Diagnosis ED Disposition    ED Disposition Condition Comment   Admit  Hospital Area: [facility] [[id number]]  Level of Care: Med-Surg [16]  Covid Evaluation: Asymptomatic Screening Protocol (No Symptoms)  Diagnosis: [facility]) [[id number]]  Admitting Physician: Lilyan Gilford [[id number]]  Attending Physician: Lilyan Gilford [[id number]]       B Medical/Surgery History Past Medical History:  Diagnosis Date  . Acid reflux   . Hypertension    Past Surgical History:  Procedure Laterality Date  . EYE SURGERY    . HERNIA REPAIR       A IV Location/Drains/Wounds Patient Lines/Drains/Airways Status    Active Line/Drains/Airways    Name  Placement date Placement time Site Days   Peripheral IV 03/07/20 Left Antecubital 03/07/20  2132  Antecubital  1          Intake/Output Last 24 hours No intake or output data in the 24 hours ending 03/08/20 0738  Labs/Imaging Results for orders placed or performed during the hospital encounter of 03/07/20 (from the past 48 hour(s))  CBC with Differential/Platelet     Status: None   Collection Time: 03/07/20  7:00 PM  Result Value Ref Range   WBC 8.4 4.0 - 10.5 K/uL   RBC 4.64 4.22 - 5.81 MIL/uL   Hemoglobin 13.9 13.0 - 17.0 g/dL   HCT 99.8 39 - 52 %   MCV 91.2 80.0 - 100.0 fL   MCH 30.0 26.0 - 34.0 pg   MCHC 32.9 30.0 - 36.0 g/dL   RDW 33.8 25.0 - 53.9 %   Platelets 293 150 - 400 K/uL   nRBC 0.0 0.0 - 0.2 %   Neutrophils Relative % 70 %   Neutro Abs 6.0 1.7 - 7.7 K/uL   Lymphocytes Relative 20 %   Lymphs Abs 1.7 0.7 - 4.0 K/uL   Monocytes Relative 9 %   Monocytes Absolute 0.7 0.1 - 1.0 K/uL   Eosinophils Relative 0 %   Eosinophils Absolute 0.0 0.0 - 0.5 K/uL   Basophils Relative 1 %   Basophils Absolute 0.1 0.0 - 0.1 K/uL   Immature Granulocytes 0 %   Abs Immature Granulocytes 0.02 0.00 - 0.07 K/uL  Comment: Performed at [facility], [address], [place], [place] [address]  Comprehensive metabolic panel     Status: Abnormal   Collection Time: 03/07/20  7:00 PM  Result Value Ref Range   Sodium 130 (L) 135 - 145 mmol/L   Potassium 4.9 3.5 - 5.1 mmol/L   Chloride 92 (L) 98 - 111 mmol/L   CO2 24 22 - 32 mmol/L   Glucose, Bld 98 70 - 99 mg/dL    Comment: Glucose reference range applies only to samples taken after fasting for at least 8 hours.   BUN 22 8 - 23 mg/dL   Creatinine, Ser 6.04 (H) 0.61 - 1.24 mg/dL   Calcium 9.8 8.9 - 54.0 mg/dL   Total Protein 8.3 (H) 6.5 - 8.1 g/dL   Albumin 4.3 3.5 - 5.0 g/dL   AST 24 15 - 41 U/L   ALT 16 0 - 44 U/L   Alkaline Phosphatase 111 38 - 126 U/L   Total Bilirubin 1.2 0.3 - 1.2 mg/dL   GFR, Estimated 42 (L) >60 mL/min    Anion gap 14 5 - 15    Comment: Performed at [facility], [address], [place], [place] [address]  Troponin I (High Sensitivity)     Status: None   Collection Time: 03/07/20  7:00 PM  Result Value Ref Range   Troponin I (High Sensitivity) 16 <18 ng/L    Comment: (NOTE) Elevated high sensitivity troponin I (hsTnI) values and significant  changes across serial measurements may suggest ACS but many other  chronic and acute conditions are known to elevate hsTnI results.  Refer to the "Links" section for chest pain algorithms and additional  guidance. Performed at [facility], [address] [place], [place] [address]   Resp Panel by RT PCR (RSV, Flu A&B, Covid) - Nasopharyngeal Swab     Status: None   Collection Time: 03/08/20  1:36 AM   Specimen: Nasopharyngeal Swab  Result Value Ref Range   SARS Coronavirus 2 by RT PCR NEGATIVE NEGATIVE    Comment: (NOTE) SARS-CoV-2 target nucleic acids are NOT DETECTED.  The SARS-CoV-2 RNA is generally detectable in upper respiratoy specimens during the acute phase of infection. The lowest concentration of SARS-CoV-2 viral copies this assay can detect is 131 copies/mL. A negative result does not preclude SARS-Cov-2 infection and should not be used as the sole basis for treatment or other patient management decisions. A negative result may occur with  improper specimen collection/handling, submission of specimen other than nasopharyngeal swab, presence of viral mutation(s) within the areas targeted by this assay, and inadequate number of viral copies (<131 copies/mL). A negative result must be combined with clinical observations, patient history, and epidemiological information. The expected result is Negative.  Fact Sheet for Patients:  https://www.moore.com/  Fact Sheet for Healthcare Providers:  https://www.young.biz/  This test is no t yet approved or cleared by the [place] FDA and  has been  authorized for detection and/or diagnosis of SARS-CoV-2 by FDA under an Emergency Use Authorization (EUA). This EUA will remain  in effect (meaning this test can be used) for the duration of the COVID-19 declaration under Section 564(b)(1) of the Act, 21 U.S.C. section 360bbb-3(b)(1), unless the authorization is terminated or revoked sooner.     Influenza A by PCR NEGATIVE NEGATIVE   Influenza B by PCR NEGATIVE NEGATIVE    Comment: (NOTE) The Xpert Xpress SARS-CoV-2/FLU/RSV assay is intended as an aid in  the diagnosis of influenza from Nasopharyngeal  swab specimens and  should not be used as a sole basis for treatment. Nasal washings and  aspirates are unacceptable for Xpert Xpress SARS-CoV-2/FLU/RSV  testing.  Fact Sheet for Patients: https://www.moore.com/  Fact Sheet for Healthcare Providers: https://www.young.biz/  This test is not yet approved or cleared by the [place] FDA and  has been authorized for detection and/or diagnosis of SARS-CoV-2 by  FDA under an Emergency Use Authorization (EUA). This EUA will remain  in effect (meaning this test can be used) for the duration of the  Covid-19 declaration under Section 564(b)(1) of the Act, 21  U.S.C. section 360bbb-3(b)(1), unless the authorization is  terminated or revoked.    Respiratory Syncytial Virus by PCR NEGATIVE NEGATIVE    Comment: (NOTE) Fact Sheet for Patients: https://www.moore.com/  Fact Sheet for Healthcare Providers: https://www.young.biz/  This test is not yet approved or cleared by the [place] FDA and  has been authorized for detection and/or diagnosis of SARS-CoV-2 by  FDA under an Emergency Use Authorization (EUA). This EUA will remain  in effect (meaning this test can be used) for the duration of the  COVID-19 declaration under Section 564(b)(1) of the Act, 21 U.S.C.  section 360bbb-3(b)(1), unless the  authorization is terminated or  revoked. Performed at [facility], [address] [place], [place] [address]   Comprehensive metabolic panel     Status: Abnormal   Collection Time: 03/08/20  4:31 AM  Result Value Ref Range   Sodium 129 (L) 135 - 145 mmol/L   Potassium 4.5 3.5 - 5.1 mmol/L   Chloride 95 (L) 98 - 111 mmol/L   CO2 23 22 - 32 mmol/L   Glucose, Bld 86 70 - 99 mg/dL    Comment: Glucose reference range applies only to samples taken after fasting for at least 8 hours.   BUN 22 8 - 23 mg/dL   Creatinine, Ser 8.25 (H) 0.61 - 1.24 mg/dL   Calcium 8.9 8.9 - 05.3 mg/dL   Total Protein 6.1 (L) 6.5 - 8.1 g/dL   Albumin 3.3 (L) 3.5 - 5.0 g/dL   AST 20 15 - 41 U/L   ALT 13 0 - 44 U/L   Alkaline Phosphatase 85 38 - 126 U/L   Total Bilirubin 1.0 0.3 - 1.2 mg/dL   GFR, Estimated 47 (L) >60 mL/min   Anion gap 11 5 - 15    Comment: Performed at [facility], [address] [place], [place] [address]  Magnesium     Status: None   Collection Time: 03/08/20  4:31 AM  Result Value Ref Range   Magnesium 2.0 1.7 - 2.4 mg/dL    Comment: Performed at [facility], [address], [place], [place] [address]  CBC     Status: Abnormal   Collection Time: 03/08/20  4:31 AM  Result Value Ref Range   WBC 6.7 4.0 - 10.5 K/uL   RBC 4.04 (L) 4.22 - 5.81 MIL/uL   Hemoglobin 12.2 (L) 13.0 - 17.0 g/dL   HCT 90.2 (L) 39 - 52 %   MCV 92.1 80.0 - 100.0 fL   MCH 30.2 26.0 - 34.0 pg   MCHC 32.8 30.0 - 36.0 g/dL   RDW 40.9 73.5 - 32.9 %   Platelets 239 150 - 400 K/uL   nRBC 0.0 0.0 - 0.2 %    Comment: Performed at [facility], [address], [place], [place] [address]  TSH     Status: None   Collection Time: 03/08/20  4:31 AM  Result Value Ref Range   TSH 2.833 0.350 - 4.500 uIU/mL    Comment: Performed by a 3rd Generation assay with a functional sensitivity of <=0.01 uIU/mL. Performed at [facility], [address] [place], [place] [id number]    Abd 1 View (KUB)  Result Date:  03/08/2020 CLINICAL DATA:  Constipation.  Symptoms for 5 weeks. EXAM: ABDOMEN - 1 VIEW COMPARISON:  Abdominal radiographs 03/07/2020.  CT 03/07/2020 FINDINGS: Diffusely gas-filled colon with some scattered small bowel gas. No significant small or large bowel distention. Changes likely to represent ileus. Similar appearance to previous study. Residual contrast material in the bladder. IMPRESSION: Prominent gas-filled colon, likely due to ileus. Electronically Signed   By: Burman NievesWilliam  Stevens M.D.   On: 03/08/2020 04:07   CT ABDOMEN PELVIS W CONTRAST  Result Date: 03/07/2020 CLINICAL DATA:  Abdominal distention. Severe lower and mid abdominal pain and cramping. EXAM: CT ABDOMEN AND PELVIS WITH CONTRAST TECHNIQUE: Multidetector CT imaging of the abdomen and pelvis was performed using the standard protocol following bolus administration of intravenous contrast. CONTRAST:  100mL OMNIPAQUE IOHEXOL 300 MG/ML  SOLN COMPARISON:  Abdominal radiographs 03/07/2020. CT abdomen and pelvis 01/28/2020 FINDINGS: Lower chest: Mild peripheral interstitial changes, likely mild fibrosis. Hepatobiliary: No focal liver abnormality is seen. No gallstones, gallbladder wall thickening, or biliary dilatation. Pancreas: Unremarkable. No pancreatic ductal dilatation or surrounding inflammatory changes. Spleen: Normal in size without focal abnormality. Adrenals/Urinary Tract: Adrenal glands are unremarkable. Kidneys are normal, without renal calculi, focal lesion, or hydronephrosis. Bladder is unremarkable. Stomach/Bowel: Mild prominence of gas-filled colon without wall thickening. Likely ileus. Stomach and small bowel are not abnormally distended. Appendix is normal. Vascular/Lymphatic: Aortic atherosclerosis. No enlarged abdominal or pelvic lymph nodes. Reproductive: Prostate gland is enlarged. Other: Small amount of free fluid in the pelvis is likely reactive. No free air. Abdominal wall musculature appears intact. Postoperative changes in  the groins consistent with previous hernia repair. Musculoskeletal: Lumbar scoliosis convex towards the left. Degenerative changes in the lumbar spine and hips. No destructive bone lesions. IMPRESSION: 1. Mild prominence of gas-filled colon without wall thickening. Likely ileus. No evidence of bowel obstruction. 2. Small amount of free fluid in the pelvis is likely reactive. 3. Prostate gland is enlarged. 4. Aortic atherosclerosis. 5. Mild peripheral interstitial lung changes, likely fibrosis. Aortic Atherosclerosis (ICD10-I70.0). Electronically Signed   By: Burman NievesWilliam  Stevens M.D.   On: 03/07/2020 22:17   DG ABD ACUTE 2+V W 1V CHEST  Result Date: 03/07/2020 CLINICAL DATA:  Chest pain.  Constipation.  Foreign body in rectum. EXAM: DG ABDOMEN ACUTE W/ 1V CHEST COMPARISON:  02/14/2020 FINDINGS: Heart is normal size.  No confluent opacities or effusions. Mild diffuse gaseous distention of bowel. No evidence of bowel obstruction. Moderate stool burden in the right colon. No organomegaly, free air, suspicious calcification or visible radiopaque foreign body. IMPRESSION: Mild diffuse gaseous distention of bowel. Moderate stool burden in the right colon. No acute cardiopulmonary disease. Electronically Signed   By: Charlett NoseKevin  Dover M.D.   On: 03/07/2020 18:40    Pending Labs Unresulted Labs (From admission, onward)         None      Vitals/Pain Today's Vitals   03/08/20 0100 03/08/20 0200 03/08/20 0300 03/08/20 0400  BP: (!) 161/73 (!) 168/73 117/67 138/65  Pulse:  68  72  Resp:    16  Temp:    98.1 F (36.7 C)  TempSrc:      SpO2:  99%  99%  Weight:  Height:      PainSc:        Isolation Precautions No active isolations  Medications Medications  lisinopril (ZESTRIL) tablet 10 mg (has no administration in time range)  heparin injection 5,000 Units (0 Units Subcutaneous Hold 03/08/20 0546)  0.9 %  sodium chloride infusion ( Intravenous New Bag/Given 03/08/20 0140)  acetaminophen  (TYLENOL) tablet 650 mg (has no administration in time range)    Or  acetaminophen (TYLENOL) suppository 650 mg (has no administration in time range)  ketorolac (TORADOL) 15 MG/ML injection 15 mg (has no administration in time range)  ondansetron (ZOFRAN) tablet 4 mg (has no administration in time range)    Or  ondansetron (ZOFRAN) injection 4 mg (has no administration in time range)  iohexol (OMNIPAQUE) 300 MG/ML solution 100 mL (100 mLs Intravenous Contrast Given 03/07/20 2152)    Mobility walks with device Low fall risk   Focused Assessments    R Recommendations: See Admitting Provider Note  Report given to:   Additional Notes:

## 2020-03-08 NOTE — Consult Note (Addendum)
Referring Provider: Dr. Roberto ScalesShayla Nettey Primary Care Physician:  Richardean Chimeraaniel, Terry G, MD Primary Gastroenterologist:  Dr. Jena Gaussourk   Date of Admission: 03/07/20 Date of Consultation: 03/08/20  Reason for Consultation:  Suspicion for foreign body in rectum/colon, with ileus and abdominal pain, and stool burden  HPI:  Hector Lowe is a 86 y.o. year old male with chronic history of constipation, presenting to [facility] on 3 separate occasions in September with constipation and abdominal pain. Presented to the ED yesterday with concerns for constipation, foreign body in rectum, and generalized weakness. Outside films from [facility] including 3 abdominal xrays and one CT abd/pelvis with contrast (01/28/20). Most recent outside xray with scattered large and small bowel gas, no obstruction. CT abd/pelvis with contrast yesterday revealing mild prominence of gas-filled colon without wall thickening, likely ileus. Moderate stool burden in right colon on abdominal xray yesterday. Today, one view abdominal xray with diffusely gas-filled colon with some scattered small bowel gas, no significant small or large bowel distension.   Patient notes approximately 5 weeks ago he used a bottle that is normally used to feed baby calves, as the bottle was larger and the nipple seemed to be better per patient for administering an enema. He notes that he completed the enema, but when he removed the bottle, the nipple was not present. He notes a "very good" BM about 4 days ago. He has a BM every 4-5 days and is using some type of laxative therapy daily. He is unsure what this may be.    Has little "dribbles" of stool when urinating. No abdominal pain. Nauseated when presenting to ED but now resolved. +belching. +flatus. Feels better now. States he drinks a bottle of something every day per Dr. Rosann Auerbachaniel's instructions. He is unsure what this is called. Remote colon[facility] at [facility], where he states they "Stirred the stool up" to get it  out.   He believes he has lost weight, as he has had to adjust the notches in his belt.   Past Medical History:  Diagnosis Date  . Acid reflux   . Hypertension     Past Surgical History:  Procedure Laterality Date  . EYE SURGERY    . HERNIA REPAIR      Prior to Admission medications   Medication Sig Start Date End Date Taking? Authorizing Provider  calcium carbonate (TUMS - DOSED IN MG ELEMENTAL CALCIUM) 500 MG chewable tablet Chew 1 tablet by mouth daily as needed for indigestion or heartburn.   Yes [provider]  ibuprofen (ADVIL,MOTRIN) 200 MG tablet Take 200 mg by mouth daily as needed for mild pain or moderate pain.   Yes [provider]  lisinopril (PRINIVIL,ZESTRIL) 10 MG tablet Take 10 mg by mouth daily.     Yes [provider]  omeprazole (PRILOSEC) 20 MG capsule Take 20 mg by mouth daily.     Yes [provider]    Current Facility-Administered Medications  Medication Dose Route Frequency Provider Last Rate Last Admin  . 0.9 %  sodium chloride infusion   Intravenous Continuous Zierle-Ghosh, Asia B, DO 100 mL/hr at 03/08/20 1203 New Bag at 03/08/20 1203  . acetaminophen (TYLENOL) tablet 650 mg  650 mg Oral Q6H PRN Zierle-Ghosh, Asia B, DO       Or  . acetaminophen (TYLENOL) suppository 650 mg  650 mg Rectal Q6H PRN Zierle-Ghosh, Asia B, DO      . heparin injection 5,000 Units  5,000 Units Subcutaneous Q8H Zierle-Ghosh, Asia B, DO      .  ketorolac (TORADOL) 15 MG/ML injection 15 mg  15 mg Intravenous Q6H PRN Zierle-Ghosh, Asia B, DO      . lisinopril (ZESTRIL) tablet 10 mg  10 mg Oral Daily Zierle-Ghosh, Asia B, DO   10 mg at 03/08/20 1330  . ondansetron (ZOFRAN) tablet 4 mg  4 mg Oral Q6H PRN Zierle-Ghosh, Asia B, DO       Or  . ondansetron (ZOFRAN) injection 4 mg  4 mg Intravenous Q6H PRN Zierle-Ghosh, Asia B, DO        Allergies as of 03/07/2020  . (No Known Allergies)    Family History  Problem Relation Age of Onset  .  Colon cancer Neg Hx     Social History   Socioeconomic History  . Marital status: Widowed    Spouse name: Not on file  . Number of children: Not on file  . Years of education: Not on file  . Highest education level: Not on file  Occupational History  . Not on file  Tobacco Use  . Smoking status: Never Smoker  . Smokeless tobacco: Current User    Types: Chew  Vaping Use  . Vaping Use: Never used  Substance and Sexual Activity  . Alcohol use: No  . Drug use: No  . Sexual activity: Not on file  Other Topics Concern  . Not on file  Social History Narrative  . Not on file   Social Determinants of Health   Financial Resource Strain:   . Difficulty of Paying Living Expenses: Not on file  Food Insecurity:   . Worried About Programme researcher, broadcasting/film/video in the Last Year: Not on file  . Ran Out of Food in the Last Year: Not on file  Transportation Needs:   . Lack of Transportation (Medical): Not on file  . Lack of Transportation (Non-Medical): Not on file  Physical Activity:   . Days of Exercise per Week: Not on file  . Minutes of Exercise per Session: Not on file  Stress:   . Feeling of Stress : Not on file  Social Connections:   . Frequency of Communication with Friends and Family: Not on file  . Frequency of Social Gatherings with Friends and Family: Not on file  . Attends Religious Services: Not on file  . Active Member of Clubs or Organizations: Not on file  . Attends Banker Meetings: Not on file  . Marital Status: Not on file  Intimate Partner Violence:   . Fear of Current or Ex-Partner: Not on file  . Emotionally Abused: Not on file  . Physically Abused: Not on file  . Sexually Abused: Not on file    Review of Systems: Gen: see HPI CV: Denies chest pain, heart palpitations, syncope, edema  Resp: Denies shortness of breath with rest, cough, wheezing GI: see HPI GU : Denies urinary burning, urinary frequency, urinary incontinence.  MS: Denies joint  pain,swelling, cramping Derm: Denies rash, itching, dry skin Psych: Denies depression, anxiety,confusion, or memory loss Heme: see HPI  Physical Exam: Vital signs in last 24 hours: Temp:  [97.4 F (36.3 C)-98.4 F (36.9 C)] 97.4 F (36.3 C) (10/18 1404) Pulse Rate:  [57-72] 57 (10/18 1404) Resp:  [16-18] 18 (10/18 1404) BP: (117-182)/(63-91) 136/63 (10/18 1404) SpO2:  [99 %-100 %] 100 % (10/18 1404) Weight:  [50 kg] 50 kg (10/18 0958) Last BM Date: 03/08/20 General:   Alert, frail older adult Head:  Normocephalic and atraumatic. Eyes:  Sclera clear, no icterus.  Ears:  Very hard of hearing  Nose:  No deformity, discharge,  or lesions. Mouth:  No deformity or lesions, dentition normal. Lungs:  Clear throughout to auscultation.    Heart:  S1 S2 present without murmurs Abdomen:  Soft, mild TTP left lower quadrant but without rebound or guarding, nondistended. No masses, hepatosplenomegaly or hernias noted. Normal bowel sounds. Rectal:  No external hemorrhoids, internal exam without impaction, no mass, no stool in rectal vault Msk:  Symmetrical without gross deformities. Normal posture. Extremities:  Without  edema. Neurologic:  Alert and  oriented x Psych:  Alert and cooperative. Normal mood and affect.  Intake/Output from previous day: No intake/output data recorded. Intake/Output this shift: Total I/O In: -  Out: 100 [Urine:100]  Lab Results: Recent Labs    03/07/20 1900 03/08/20 0431  WBC 8.4 6.7  HGB 13.9 12.2*  HCT 42.3 37.2*  PLT 293 239   BMET Recent Labs    03/07/20 1900 03/08/20 0431  NA 130* 129*  K 4.9 4.5  CL 92* 95*  CO2 24 23  GLUCOSE 98 86  BUN 22 22  CREATININE 1.42* 1.28*  CALCIUM 9.8 8.9   LFT Recent Labs    03/07/20 1900 03/08/20 0431  PROT 8.3* 6.1*  ALBUMIN 4.3 3.3*  AST 24 20  ALT 16 13  ALKPHOS 111 85  BILITOT 1.2 1.0    Studies/Results: Abd 1 View (KUB)  Result Date: 03/08/2020 CLINICAL DATA:  Constipation.   Symptoms for 5 weeks. EXAM: ABDOMEN - 1 VIEW COMPARISON:  Abdominal radiographs 03/07/2020.  CT 03/07/2020 FINDINGS: Diffusely gas-filled colon with some scattered small bowel gas. No significant small or large bowel distention. Changes likely to represent ileus. Similar appearance to previous study. Residual contrast material in the bladder. IMPRESSION: Prominent gas-filled colon, likely due to ileus. Electronically Signed   By: Burman Nieves M.D.   On: 03/08/2020 04:07   CT ABDOMEN PELVIS W CONTRAST  Result Date: 03/07/2020 CLINICAL DATA:  Abdominal distention. Severe lower and mid abdominal pain and cramping. EXAM: CT ABDOMEN AND PELVIS WITH CONTRAST TECHNIQUE: Multidetector CT imaging of the abdomen and pelvis was performed using the standard protocol following bolus administration of intravenous contrast. CONTRAST:  OMNIPAQUE IOHEXOL 300 MG/ML  SOLN COMPARISON:  Abdominal radiographs 03/07/2020. CT abdomen and pelvis 01/28/2020 FINDINGS: Lower chest: Mild peripheral interstitial changes, likely mild fibrosis. Hepatobiliary: No focal liver abnormality is seen. No gallstones, gallbladder wall thickening, or biliary dilatation. Pancreas: Unremarkable. No pancreatic ductal dilatation or surrounding inflammatory changes. Spleen: Normal in size without focal abnormality. Adrenals/Urinary Tract: Adrenal glands are unremarkable. Kidneys are normal, without renal calculi, focal lesion, or hydronephrosis. Bladder is unremarkable. Stomach/Bowel: Mild prominence of gas-filled colon without wall thickening. Likely ileus. Stomach and small bowel are not abnormally distended. Appendix is normal. Vascular/Lymphatic: Aortic atherosclerosis. No enlarged abdominal or pelvic lymph nodes. Reproductive: Prostate gland is enlarged. Other: Small amount of free fluid in the pelvis is likely reactive. No free air. Abdominal wall musculature appears intact. Postoperative changes in the groins consistent with previous hernia  repair. Musculoskeletal: Lumbar scoliosis convex towards the left. Degenerative changes in the lumbar spine and hips. No destructive bone lesions. IMPRESSION: 1. Mild prominence of gas-filled colon without wall thickening. Likely ileus. No evidence of bowel obstruction. 2. Small amount of free fluid in the pelvis is likely reactive. 3. Prostate gland is enlarged. 4. Aortic atherosclerosis. 5. Mild peripheral interstitial lung changes, likely fibrosis. Aortic Atherosclerosis (ICD10-I70.0). Electronically Signed   By: Burman Nieves  M.D.   On: 03/07/2020 22:17   DG ABD ACUTE 2+V W 1V CHEST  Result Date: 03/07/2020 CLINICAL DATA:  Chest pain.  Constipation.  Foreign body in rectum. EXAM: DG ABDOMEN ACUTE W/ 1V CHEST COMPARISON:  02/14/2020 FINDINGS: Heart is normal size.  No confluent opacities or effusions. Mild diffuse gaseous distention of bowel. No evidence of bowel obstruction. Moderate stool burden in the right colon. No organomegaly, free air, suspicious calcification or visible radiopaque foreign body. IMPRESSION: Mild diffuse gaseous distention of bowel. Moderate stool burden in the right colon. No acute cardiopulmonary disease. Electronically Signed   By: Charlett Nose M.D.   On: 03/07/2020 18:40    Impression: 86 year old male with long-standing history of constipation, presenting to outside facility [facility]) on 3 separate occasions in September with abdominal pain, constipation, colonic ileus, now presenting to the ED yesterday with constipation and patient concerned for retained enema tip from 5 weeks ago.   CT abd/pelvis with contrast yesterday noting mild prominence of gas-filled colon without wall thickening, likely ileus. Moderate stool burden in right colon on abdominal films yesterday. Patient notes using a bottle with a nipple tip (Livestock bottle used to feed calves) to administer soap suds enema approximately 5 weeks ago, somewhere in the timeline of his frequent visits to [place],  and when dislodging this, the nipple was not present. He has since had BMs, using OTC laxative therapy (unknown products), and his last good bowel movement was 4 days ago.   Rectal exam completed at bedside today, and there was no fecal impaction. Patient's physical exam is benign, with good bowel sounds. He is passing flatus frequently and notes resolution of abdominal discomfort and nausea. Will start clear liquids today. Linzess may be a good alternative instead of multiple OTC agents that he is using at home. I did discuss with Dr. Tyron Russell the ability for CT to pick up on the nipple tip, and CT would likely have shown this. Notably, his last colonoscopy was about 5 years ago at [facility], and I am unable to see any of these reports.   Plan: Clear liquids for now Encourage ambulation Address electrolyte abnormalities: mild hyponatremia on admission Consider Linzess  instead of his multiple OTC agents he has been using Will discuss need for colonoscopy due to patient's concern for retained enema tip from several weeks ago.    Gelene Mink, PhD, ANP-BC [facility]     LOS: 0 days    03/08/2020, 2:55 PM

## 2020-03-08 NOTE — Progress Notes (Signed)
[facility]  PROGRESS NOTE  Hector Lowe QRF:[id number] DOB: 1922/02/06 DOA: 03/07/2020 PCP: Richardean Chimera, MD Admit date - 03/07/2020   Admitting Physician Laverna Peace, MD  Outpatient Primary MD for the patient is Richardean Chimera, MD  LOS - 0 Brief Narrative   Hector Lowe is a 86 y.o. year old male with medical history significant for constipation GERD, HTN who presents on 10/17 with complaints of ongoing bouts of constipation over the past 5 weeks.  Has significantly worsened after patient attempted his own enema with a cast bottle but noted that when he used it the nipple was no longer on it and has since been concerned about retaining a foreign body in his rectum for which she was seen at an outside hospital which obtain imaging that was unremarkable per patient.  In the ED he was afebrile, blood pressure noted to be 182/82, sodium 130, creatinine 1.4 consistent with baseline, Covid test negative, initial x-ray showing suspicious calcification or visible radiopaque foreign body with moderate stool burden in the right colon as well as concern for ileus.  CT abdomen confirmed ileus with no signs of obstruction.  Given concern for constipation with noted ileus and reported possible foreign body GI was consulted.  Patient still has some mild abdominal pain with nausea and decreased appetite but no vomiting.   Subjective  Reports some mild abdominal discomfort.  No overt nausea or vomiting.  No fevers or chills.  A & P   Constipation with ileus with history of chronic constipation and suspicion for foreign body in rectum/colon.  Confirmed ileus on CT abdomen.  Concerned constipation could be secondary to foreign body (?  Retained nipple from Bottle), patient does have chronic history of constipation.  TSH within normal limits.  Currently n.p.o. -Continue n.p.o. bowel rest -IV hydration while n.p.o. -Appreciate GI recommendations  Hypertension, SBP in the 170s, not at  goal -Continue home lisinopril -IV hydralazine as needed  CKD 3 stable.  Creatinine at baseline -Avoid nephrotoxins -Monitor output  Hyponatremia, mild, acute.  Likely related to diminished appetite in the setting of generalized malaise related to above. -Continue IV fluids -Monitor BMP  GERD -PPI held on admission given concern could be contributing to constipation  Nicotine dependence.  Patient admits to chewing but does not want nicotine patch at this time     Family Communication  : None  Code Status : DNR, discussed on day of admission  Disposition Plan  :  Patient is from home. Anticipated d/c date: 2 to 3 days. Barriers to d/c or necessity for inpatient status:  Patient is limited as observation status however will change to inpatient status given patient has ongoing constipation with development of ileus leading to generalized malaise diminished appetite now causing metabolic dysfunction with hyponatremia warrants continued inpatient stay given need for IV fluids, as well as recommendations from GI consultation Consults  : Gastroenterology  Procedures  : None  DVT Prophylaxis  : Heparin  MDM: The below labs and imaging reports were reviewed and summarized above.  Medication management as above.  Lab Results  Component Value Date   PLT 239 03/08/2020    Diet :  Diet Order            Diet NPO time specified Except for: Sips with Meds, Ice Chips  Diet effective now                  Inpatient Medications Scheduled Meds: . heparin  5,000 Units Subcutaneous Q8H  . lisinopril  10 mg Oral Daily   Continuous Infusions: . sodium chloride 100 mL/hr at 03/08/20 1203   PRN Meds:.acetaminophen **OR** acetaminophen, ketorolac, ondansetron **OR** ondansetron (ZOFRAN) IV  Antibiotics  :   Anti-infectives (From admission, onward)   None       Objective   Vitals:   03/08/20 0800 03/08/20 0938 03/08/20 0958 03/08/20 1404  BP: 137/71 (!) 182/91 (!) 180/84  136/63  Pulse:  72 67 (!) 57  Resp:  18 18 18   Temp:  97.9 F (36.6 C) (!) 97.5 F (36.4 C) (!) 97.4 F (36.3 C)  TempSrc:  Oral Oral Oral  SpO2:  100% 100% 100%  Weight:   50 kg   Height:   5\' 7"  (1.702 m)     SpO2: 100 %  Wt Readings from Last 3 Encounters:  03/08/20 50 kg  10/03/17 56.7 kg  03/10/11 61.2 kg     Intake/Output Summary (Last 24 hours) at 03/08/2020 1450 Last data filed at 03/08/2020 1320 Gross per 24 hour  Intake --  Output 100 ml  Net -100 ml    Physical Exam:     Awake Alert, Oriented X 3, Normal affect No new F.N deficits,  Turner.AT, dry oral mucosa Normal respiratory effort on room air, CTAB RRR, SEM present at apex, no radiation Abdomen soft, nondistended, nontender, diminished bowel sounds No Cyanosis, No new Rash or bruise     I have personally reviewed the following:   Data Reviewed:  CBC Recent Labs  Lab 03/07/20 1900 03/08/20 0431  WBC 8.4 6.7  HGB 13.9 12.2*  HCT 42.3 37.2*  PLT 293 239  MCV 91.2 92.1  MCH 30.0 30.2  MCHC 32.9 32.8  RDW 12.7 12.9  LYMPHSABS 1.7  --   MONOABS 0.7  --   EOSABS 0.0  --   BASOSABS 0.1  --     Chemistries  Recent Labs  Lab 03/07/20 1900 03/08/20 0431  NA 130* 129*  K 4.9 4.5  CL 92* 95*  CO2 24 23  GLUCOSE 98 86  BUN 22 22  CREATININE 1.42* 1.28*  CALCIUM 9.8 8.9  MG  --  2.0  AST 24 20  ALT 16 13  ALKPHOS 111 85  BILITOT 1.2 1.0   ------------------------------------------------------------------------------------------------------------------ No results for input(s): CHOL, HDL, LDLCALC, TRIG, CHOLHDL, LDLDIRECT in the last 72 hours.  No results found for: HGBA1C ------------------------------------------------------------------------------------------------------------------ Recent Labs    03/08/20 0431  TSH 2.833   ------------------------------------------------------------------------------------------------------------------ No results for input(s): VITAMINB12,  FOLATE, FERRITIN, TIBC, IRON, RETICCTPCT in the last 72 hours.  Coagulation profile No results for input(s): INR, PROTIME in the last 168 hours.  No results for input(s): DDIMER in the last 72 hours.  Cardiac Enzymes No results for input(s): CKMB, TROPONINI, MYOGLOBIN in the last 168 hours.  Invalid input(s): CK ------------------------------------------------------------------------------------------------------------------ No results found for: BNP  Micro Results Recent Results (from the past 240 hour(s))  Resp Panel by RT PCR (RSV, Flu A&B, Covid) - Nasopharyngeal Swab     Status: None   Collection Time: 03/08/20  1:36 AM   Specimen: Nasopharyngeal Swab  Result Value Ref Range Status   SARS Coronavirus 2 by RT PCR NEGATIVE NEGATIVE Final    Comment: (NOTE) SARS-CoV-2 target nucleic acids are NOT DETECTED.  The SARS-CoV-2 RNA is generally detectable in upper respiratoy specimens during the acute phase of infection. The lowest concentration of SARS-CoV-2 viral copies this assay can detect is 131 copies/mL.  A negative result does not preclude SARS-Cov-2 infection and should not be used as the sole basis for treatment or other patient management decisions. A negative result may occur with  improper specimen collection/handling, submission of specimen other than nasopharyngeal swab, presence of viral mutation(s) within the areas targeted by this assay, and inadequate number of viral copies (<131 copies/mL). A negative result must be combined with clinical observations, patient history, and epidemiological information. The expected result is Negative.  Fact Sheet for Patients:  https://www.moore.com/  Fact Sheet for Healthcare Providers:  https://www.young.biz/  This test is no t yet approved or cleared by the [place] FDA and  has been authorized for detection and/or diagnosis of SARS-CoV-2 by FDA under an Emergency Use  Authorization (EUA). This EUA will remain  in effect (meaning this test can be used) for the duration of the COVID-19 declaration under Section 564(b)(1) of the Act, 21 U.S.C. section 360bbb-3(b)(1), unless the authorization is terminated or revoked sooner.     Influenza A by PCR NEGATIVE NEGATIVE Final   Influenza B by PCR NEGATIVE NEGATIVE Final    Comment: (NOTE) The Xpert Xpress SARS-CoV-2/FLU/RSV assay is intended as an aid in  the diagnosis of influenza from Nasopharyngeal swab specimens and  should not be used as a sole basis for treatment. Nasal washings and  aspirates are unacceptable for Xpert Xpress SARS-CoV-2/FLU/RSV  testing.  Fact Sheet for Patients: https://www.moore.com/  Fact Sheet for Healthcare Providers: https://www.young.biz/  This test is not yet approved or cleared by the [place] FDA and  has been authorized for detection and/or diagnosis of SARS-CoV-2 by  FDA under an Emergency Use Authorization (EUA). This EUA will remain  in effect (meaning this test can be used) for the duration of the  Covid-19 declaration under Section 564(b)(1) of the Act, 21  U.S.C. section 360bbb-3(b)(1), unless the authorization is  terminated or revoked.    Respiratory Syncytial Virus by PCR NEGATIVE NEGATIVE Final    Comment: (NOTE) Fact Sheet for Patients: https://www.moore.com/  Fact Sheet for Healthcare Providers: https://www.young.biz/  This test is not yet approved or cleared by the [place] FDA and  has been authorized for detection and/or diagnosis of SARS-CoV-2 by  FDA under an Emergency Use Authorization (EUA). This EUA will remain  in effect (meaning this test can be used) for the duration of the  COVID-19 declaration under Section 564(b)(1) of the Act, 21 U.S.C.  section 360bbb-3(b)(1), unless the authorization is terminated or  revoked. Performed at [facility], [address] [place], [place] [address]     Radiology Reports Abd 1 View (KUB)  Result Date: 03/08/2020 CLINICAL DATA:  Constipation.  Symptoms for 5 weeks. EXAM: ABDOMEN - 1 VIEW COMPARISON:  Abdominal radiographs 03/07/2020.  CT 03/07/2020 FINDINGS: Diffusely gas-filled colon with some scattered small bowel gas. No significant small or large bowel distention. Changes likely to represent ileus. Similar appearance to previous study. Residual contrast material in the bladder. IMPRESSION: Prominent gas-filled colon, likely due to ileus. Electronically Signed   By: Burman Nieves M.D.   On: 03/08/2020 04:07   CT ABDOMEN PELVIS W CONTRAST  Result Date: 03/07/2020 CLINICAL DATA:  Abdominal distention. Severe lower and mid abdominal pain and cramping. EXAM: CT ABDOMEN AND PELVIS WITH CONTRAST TECHNIQUE: Multidetector CT imaging of the abdomen and pelvis was performed using the standard protocol following bolus administration of intravenous contrast. CONTRAST:  OMNIPAQUE IOHEXOL 300 MG/ML  SOLN COMPARISON:  Abdominal radiographs 03/07/2020. CT abdomen and pelvis 01/28/2020  FINDINGS: Lower chest: Mild peripheral interstitial changes, likely mild fibrosis. Hepatobiliary: No focal liver abnormality is seen. No gallstones, gallbladder wall thickening, or biliary dilatation. Pancreas: Unremarkable. No pancreatic ductal dilatation or surrounding inflammatory changes. Spleen: Normal in size without focal abnormality. Adrenals/Urinary Tract: Adrenal glands are unremarkable. Kidneys are normal, without renal calculi, focal lesion, or hydronephrosis. Bladder is unremarkable. Stomach/Bowel: Mild prominence of gas-filled colon without wall thickening. Likely ileus. Stomach and small bowel are not abnormally distended. Appendix is normal. Vascular/Lymphatic: Aortic atherosclerosis. No enlarged abdominal or pelvic lymph nodes. Reproductive: Prostate gland is enlarged. Other: Small amount of free fluid in the pelvis is likely  reactive. No free air. Abdominal wall musculature appears intact. Postoperative changes in the groins consistent with previous hernia repair. Musculoskeletal: Lumbar scoliosis convex towards the left. Degenerative changes in the lumbar spine and hips. No destructive bone lesions. IMPRESSION: 1. Mild prominence of gas-filled colon without wall thickening. Likely ileus. No evidence of bowel obstruction. 2. Small amount of free fluid in the pelvis is likely reactive. 3. Prostate gland is enlarged. 4. Aortic atherosclerosis. 5. Mild peripheral interstitial lung changes, likely fibrosis. Aortic Atherosclerosis (ICD10-I70.0). Electronically Signed   By: Burman Nieves M.D.   On: 03/07/2020 22:17   DG ABD ACUTE 2+V W 1V CHEST  Result Date: 03/07/2020 CLINICAL DATA:  Chest pain.  Constipation.  Foreign body in rectum. EXAM: DG ABDOMEN ACUTE W/ 1V CHEST COMPARISON:  02/14/2020 FINDINGS: Heart is normal size.  No confluent opacities or effusions. Mild diffuse gaseous distention of bowel. No evidence of bowel obstruction. Moderate stool burden in the right colon. No organomegaly, free air, suspicious calcification or visible radiopaque foreign body. IMPRESSION: Mild diffuse gaseous distention of bowel. Moderate stool burden in the right colon. No acute cardiopulmonary disease. Electronically Signed   By: Charlett Nose M.D.   On: 03/07/2020 18:40     Time Spent in minutes  30     Laverna Peace M.D on 03/08/2020 at 2:50 PM  To page go to [email or web address] - password [facility]

## 2020-03-08 NOTE — ED Notes (Signed)
Report to Kelly, RN 300

## 2020-03-09 DIAGNOSIS — K567 Ileus, unspecified: Principal | ICD-10-CM

## 2020-03-09 DIAGNOSIS — K5900 Constipation, unspecified: Secondary | ICD-10-CM

## 2020-03-09 DIAGNOSIS — M109 Gout, unspecified: Secondary | ICD-10-CM | POA: Diagnosis present

## 2020-03-09 DIAGNOSIS — E871 Hypo-osmolality and hyponatremia: Secondary | ICD-10-CM

## 2020-03-09 DIAGNOSIS — N183 Chronic kidney disease, stage 3 unspecified: Secondary | ICD-10-CM

## 2020-03-09 HISTORY — DX: Gout, unspecified: M10.9

## 2020-03-09 LAB — BASIC METABOLIC PANEL
Anion gap: 10 (ref 5–15)
BUN: 23 mg/dL (ref 8–23)
CO2: 22 mmol/L (ref 22–32)
Calcium: 8.6 mg/dL — ABNORMAL LOW (ref 8.9–10.3)
Chloride: 99 mmol/L (ref 98–111)
Creatinine, Ser: 1.38 mg/dL — ABNORMAL HIGH (ref 0.61–1.24)
GFR, Estimated: 43 mL/min — ABNORMAL LOW (ref >60)
Glucose, Bld: 69 mg/dL — ABNORMAL LOW (ref 70–99)
Potassium: 4.7 mmol/L (ref 3.5–5.1)
Sodium: 131 mmol/L — ABNORMAL LOW (ref 135–145)

## 2020-03-09 MED ORDER — POLYETHYLENE GLYCOL 3350 17 G PO PACK
17.0000 g | PACK | Freq: Every day | ORAL | Status: DC
  Administered 2020-03-09 – 2020-03-10 (×2): 17 g via ORAL
  Filled 2020-03-09 (×2): qty 1

## 2020-03-09 MED ORDER — PREDNISONE 20 MG PO TABS
40.0000 mg | ORAL_TABLET | Freq: Every day | ORAL | Status: AC
  Administered 2020-03-09 – 2020-03-11 (×3): 40 mg via ORAL
  Filled 2020-03-09 (×3): qty 2

## 2020-03-09 MED ORDER — SODIUM CHLORIDE 0.9 % IV SOLN: INTRAVENOUS | Status: DC

## 2020-03-09 NOTE — Progress Notes (Signed)
[facility]  PROGRESS NOTE  CASSON CATENA PXT:[id number] DOB: Jan 16, 1922 DOA: 03/07/2020 PCP: Richardean Chimera, MD Admit date - 03/07/2020   Admitting Physician Laverna Peace, MD  Outpatient Primary MD for the patient is Richardean Chimera, MD  LOS - 1 Brief Narrative   Hector Lowe is a 86 y.o. year old male with medical history significant for constipation GERD, HTN who presents on 10/17 with complaints of ongoing bouts of constipation over the past 5 weeks.  Has significantly worsened after patient attempted his own enema with a cast bottle but noted that when he used it the nipple was no longer on it and has since been concerned about retaining a foreign body in his rectum for which she was seen at an outside hospital which obtain imaging that was unremarkable per patient.  In the ED he was afebrile, blood pressure noted to be 182/82, sodium 130, creatinine 1.4 consistent with baseline, Covid test negative, initial x-ray showing suspicious calcification or visible radiopaque foreign body with moderate stool burden in the right colon as well as concern for ileus.  CT abdomen confirmed ileus with no signs of obstruction.  Given concern for constipation with noted ileus and reported possible foreign body GI was consulted.  Patient still has some mild abdominal pain with nausea and decreased appetite but no vomiting.   Subjective  Reports BM overnight. Abdominal pain better. Does note bilateral big toe pain consistent with his gout flare this morning  A & P   Constipation with ileus with history of chronic constipation and suspicion for foreign body in rectum/colon, improving.  Confirmed ileus on CT abdomen.  Concerned constipation could be secondary to foreign body (?  Retained nipple from livestock bottle used to feet calves), patient does have chronic history of constipation.  TSH within normal limits.  Tolerating liquid diets.  Rectal examination at bedside on 10/18 by GI with no signs  of fecal impaction.  Had small BM this morning, still having some nausea and concerned that he has a retained foreign body -Continue clear liquid diet -IV hydration while n.p.o. -Appreciate GI recommendations, continue Linzess (started here), plan for x-ray on 10/20  Hypertension, improved.  Not at goal on admission with SBP's in the 170s 180s, much better now at 146/66 -Continue home lisinopril -IV hydralazine as needed  CKD 3 stable.  Creatinine at baseline -Avoid nephrotoxins -Monitor output  Hyponatremia, mild, acute.  Likely related to diminished appetite in the setting of generalized malaise related to above. -Continue IV fluids -Monitor BMP  GERD -PPI held on admission given concern could be contributing to constipation  Nicotine dependence.  Patient admits to chewing but does not want nicotine patch at this time  History of gout with current exacerbation.  States has not been on any controller agents for quite a while.  Now having bilateral greater toe pain with overlying erythema at the tip that occurred in the past 24 hours.  Not a candidate for NSAIDs/colchicine given creatinine clearance and kidney dysfunction -Started prednisone -Continue to closely monitor     Family Communication  : None  Code Status : DNR, discussed on day of admission  Disposition Plan  :  Patient is from home. Anticipated d/c date: 2 to 3 days. Barriers to d/c or necessity for inpatient status:  Continue to monitor hyponatremia, ability to tolerate oral diet, bowel movements, as well as symptomatic control of concurrent gout flare Consults  : Gastroenterology  Procedures  : None  DVT Prophylaxis  : Heparin  MDM: The below labs and imaging reports were reviewed and summarized above.  Medication management as above.  Lab Results  Component Value Date   PLT 234 03/08/2020    Diet :  Diet Order            Diet clear liquid Room service appropriate? Yes; Fluid consistency: Thin  Diet  effective now                  Inpatient Medications Scheduled Meds: . heparin  5,000 Units Subcutaneous Q8H  . linaclotide  145 mcg Oral QAC breakfast  . lisinopril  10 mg Oral Daily  . polyethylene glycol  17 g Oral Daily  . predniSONE  40 mg Oral Q breakfast   Continuous Infusions:  PRN Meds:.acetaminophen **OR** acetaminophen, ketorolac, ondansetron **OR** ondansetron (ZOFRAN) IV  Antibiotics  :   Anti-infectives (From admission, onward)   None       Objective   Vitals:   03/08/20 1404 03/08/20 1800 03/08/20 2102 03/09/20 0459  BP: 136/63 133/65 (!) 156/70 (!) 146/66  Pulse: (!) 57 60 66 68  Resp: 18 18 20 17   Temp: (!) 97.4 F (36.3 C) 98 F (36.7 C) 98.2 F (36.8 C) (!) 97.5 F (36.4 C)  TempSrc: Oral Oral Oral   SpO2: 100% 100% 99% 100%  Weight:      Height:        SpO2: 100 %  Wt Readings from Last 3 Encounters:  03/08/20 50 kg  10/03/17 56.7 kg  03/10/11 61.2 kg     Intake/Output Summary (Last 24 hours) at 03/09/2020 1519 Last data filed at 03/08/2020 1528 Gross per 24 hour  Intake 1040.16 ml  Output --  Net 1040.16 ml    Physical Exam:     Awake Alert, Oriented X 3, Normal affect No new F.N deficits,  [place].AT, dry oral mucosa Normal respiratory effort on room air, CTAB RRR, SEM present at apex, no radiation Abdomen soft, nondistended, nontender, diminished bowel sounds Erythema and tenderness overlying tip of bilateral greater toe   I have personally reviewed the following:   Data Reviewed:  CBC Recent Labs  Lab 03/07/20 1900 03/08/20 0431 03/08/20 1615  WBC 8.4 6.7 6.6  HGB 13.9 12.2* 12.4*  HCT 42.3 37.2* 36.6*  PLT 293 239 234  MCV 91.2 92.1 89.3  MCH 30.0 30.2 30.2  MCHC 32.9 32.8 33.9  RDW 12.7 12.9 12.7  LYMPHSABS 1.7  --   --   MONOABS 0.7  --   --   EOSABS 0.0  --   --   BASOSABS 0.1  --   --     Chemistries  Recent Labs  Lab 03/07/20 1900 03/08/20 0431 03/09/20 0625  NA 130* 129* 131*  K 4.9 4.5  4.7  CL 92* 95* 99  CO2 24 23 22   GLUCOSE 98 86 69*  BUN 22 22 23   CREATININE 1.42* 1.28* 1.38*  CALCIUM 9.8 8.9 8.6*  MG  --  2.0  --   AST 24 20  --   ALT 16 13  --   ALKPHOS 111 85  --   BILITOT 1.2 1.0  --    ------------------------------------------------------------------------------------------------------------------ No results for input(s): CHOL, HDL, LDLCALC, TRIG, CHOLHDL, LDLDIRECT in the last 72 hours.  No results found for: HGBA1C ------------------------------------------------------------------------------------------------------------------ Recent Labs    03/08/20 0431  TSH 2.833   ------------------------------------------------------------------------------------------------------------------ No results for input(s): VITAMINB12, FOLATE, FERRITIN, TIBC, IRON, RETICCTPCT in  the last 72 hours.  Coagulation profile No results for input(s): INR, PROTIME in the last 168 hours.  No results for input(s): DDIMER in the last 72 hours.  Cardiac Enzymes No results for input(s): CKMB, TROPONINI, MYOGLOBIN in the last 168 hours.  Invalid input(s): CK ------------------------------------------------------------------------------------------------------------------ No results found for: BNP  Micro Results Recent Results (from the past 240 hour(s))  Resp Panel by RT PCR (RSV, Flu A&B, Covid) - Nasopharyngeal Swab     Status: None   Collection Time: 03/08/20  1:36 AM   Specimen: Nasopharyngeal Swab  Result Value Ref Range Status   SARS Coronavirus 2 by RT PCR NEGATIVE NEGATIVE Final    Comment: (NOTE) SARS-CoV-2 target nucleic acids are NOT DETECTED.  The SARS-CoV-2 RNA is generally detectable in upper respiratoy specimens during the acute phase of infection. The lowest concentration of SARS-CoV-2 viral copies this assay can detect is 131 copies/mL. A negative result does not preclude SARS-Cov-2 infection and should not be used as the sole basis for treatment  or other patient management decisions. A negative result may occur with  improper specimen collection/handling, submission of specimen other than nasopharyngeal swab, presence of viral mutation(s) within the areas targeted by this assay, and inadequate number of viral copies (<131 copies/mL). A negative result must be combined with clinical observations, patient history, and epidemiological information. The expected result is Negative.  Fact Sheet for Patients:  https://www.moore.com/  Fact Sheet for Healthcare Providers:  https://www.young.biz/  This test is no t yet approved or cleared by the [place] FDA and  has been authorized for detection and/or diagnosis of SARS-CoV-2 by FDA under an Emergency Use Authorization (EUA). This EUA will remain  in effect (meaning this test can be used) for the duration of the COVID-19 declaration under Section 564(b)(1) of the Act, 21 U.S.C. section 360bbb-3(b)(1), unless the authorization is terminated or revoked sooner.     Influenza A by PCR NEGATIVE NEGATIVE Final   Influenza B by PCR NEGATIVE NEGATIVE Final    Comment: (NOTE) The Xpert Xpress SARS-CoV-2/FLU/RSV assay is intended as an aid in  the diagnosis of influenza from Nasopharyngeal swab specimens and  should not be used as a sole basis for treatment. Nasal washings and  aspirates are unacceptable for Xpert Xpress SARS-CoV-2/FLU/RSV  testing.  Fact Sheet for Patients: https://www.moore.com/  Fact Sheet for Healthcare Providers: https://www.young.biz/  This test is not yet approved or cleared by the [place] FDA and  has been authorized for detection and/or diagnosis of SARS-CoV-2 by  FDA under an Emergency Use Authorization (EUA). This EUA will remain  in effect (meaning this test can be used) for the duration of the  Covid-19 declaration under Section 564(b)(1) of the Act, 21  U.S.C.  section 360bbb-3(b)(1), unless the authorization is  terminated or revoked.    Respiratory Syncytial Virus by PCR NEGATIVE NEGATIVE Final    Comment: (NOTE) Fact Sheet for Patients: https://www.moore.com/  Fact Sheet for Healthcare Providers: https://www.young.biz/  This test is not yet approved or cleared by the [place] FDA and  has been authorized for detection and/or diagnosis of SARS-CoV-2 by  FDA under an Emergency Use Authorization (EUA). This EUA will remain  in effect (meaning this test can be used) for the duration of the  COVID-19 declaration under Section 564(b)(1) of the Act, 21 U.S.C.  section 360bbb-3(b)(1), unless the authorization is terminated or  revoked. Performed at [facility], [address] [place], [place] [address]     Radiology Reports  Abd 1 View (KUB)  Result Date: 03/08/2020 CLINICAL DATA:  Constipation.  Symptoms for 5 weeks. EXAM: ABDOMEN - 1 VIEW COMPARISON:  Abdominal radiographs 03/07/2020.  CT 03/07/2020 FINDINGS: Diffusely gas-filled colon with some scattered small bowel gas. No significant small or large bowel distention. Changes likely to represent ileus. Similar appearance to previous study. Residual contrast material in the bladder. IMPRESSION: Prominent gas-filled colon, likely due to ileus. Electronically Signed   By: Burman Nieves M.D.   On: 03/08/2020 04:07   CT ABDOMEN PELVIS W CONTRAST  Result Date: 03/07/2020 CLINICAL DATA:  Abdominal distention. Severe lower and mid abdominal pain and cramping. EXAM: CT ABDOMEN AND PELVIS WITH CONTRAST TECHNIQUE: Multidetector CT imaging of the abdomen and pelvis was performed using the standard protocol following bolus administration of intravenous contrast. CONTRAST:  OMNIPAQUE IOHEXOL 300 MG/ML  SOLN COMPARISON:  Abdominal radiographs 03/07/2020. CT abdomen and pelvis 01/28/2020 FINDINGS: Lower chest: Mild peripheral interstitial changes, likely mild  fibrosis. Hepatobiliary: No focal liver abnormality is seen. No gallstones, gallbladder wall thickening, or biliary dilatation. Pancreas: Unremarkable. No pancreatic ductal dilatation or surrounding inflammatory changes. Spleen: Normal in size without focal abnormality. Adrenals/Urinary Tract: Adrenal glands are unremarkable. Kidneys are normal, without renal calculi, focal lesion, or hydronephrosis. Bladder is unremarkable. Stomach/Bowel: Mild prominence of gas-filled colon without wall thickening. Likely ileus. Stomach and small bowel are not abnormally distended. Appendix is normal. Vascular/Lymphatic: Aortic atherosclerosis. No enlarged abdominal or pelvic lymph nodes. Reproductive: Prostate gland is enlarged. Other: Small amount of free fluid in the pelvis is likely reactive. No free air. Abdominal wall musculature appears intact. Postoperative changes in the groins consistent with previous hernia repair. Musculoskeletal: Lumbar scoliosis convex towards the left. Degenerative changes in the lumbar spine and hips. No destructive bone lesions. IMPRESSION: 1. Mild prominence of gas-filled colon without wall thickening. Likely ileus. No evidence of bowel obstruction. 2. Small amount of free fluid in the pelvis is likely reactive. 3. Prostate gland is enlarged. 4. Aortic atherosclerosis. 5. Mild peripheral interstitial lung changes, likely fibrosis. Aortic Atherosclerosis (ICD10-I70.0). Electronically Signed   By: Burman Nieves M.D.   On: 03/07/2020 22:17   DG ABD ACUTE 2+V W 1V CHEST  Result Date: 03/07/2020 CLINICAL DATA:  Chest pain.  Constipation.  Foreign body in rectum. EXAM: DG ABDOMEN ACUTE W/ 1V CHEST COMPARISON:  02/14/2020 FINDINGS: Heart is normal size.  No confluent opacities or effusions. Mild diffuse gaseous distention of bowel. No evidence of bowel obstruction. Moderate stool burden in the right colon. No organomegaly, free air, suspicious calcification or visible radiopaque foreign body.  IMPRESSION: Mild diffuse gaseous distention of bowel. Moderate stool burden in the right colon. No acute cardiopulmonary disease. Electronically Signed   By: Charlett Nose M.D.   On: 03/07/2020 18:40     Time Spent in minutes  30     Laverna Peace M.D on 03/09/2020 at 3:19 PM  To page go to [email or web address] - password [facility]

## 2020-03-09 NOTE — Progress Notes (Addendum)
Subjective: States his biggest problem is gout. Pain in both of his big toes. Mild lower abdominal discomfort, like he needs to have a BM. Nausea daily for 5 weeks. 2 short lived episodes of nausea today, no vomiting. Passed a small amount of stool this morning. Stool is soft. Almost water. Had small BM yesterday that was about the same. States, "it didn't amount to anything." States,"You can tell you have too much in you." No brbpr. Stools are dark but not black. Had clear liquids this morning and tolerated this well. Stated he consumed 100%.   Again states the nipple he used for an enema has not passed.   Prior to 5 weeks ago, states bowels were moving normally without trouble.  Colonoscopy in the past at [facility] States he was "clean". Can't tell me when exactly colonoscopy was but thinks maybe 4 years ago.   Objective: Vital signs in last 24 hours: Temp:  [97.5 F (36.4 C)-98.2 F (36.8 C)] 97.5 F (36.4 C) (10/19 0459) Pulse Rate:  [60-68] 68 (10/19 0459) Resp:  [17-20] 17 (10/19 0459) BP: (133-156)/(65-70) 146/66 (10/19 0459) SpO2:  [99 %-100 %] 100 % (10/19 0459) Last BM Date: 03/08/20 General:   Alert, pleasant, significantly decreased auditory acuity. Hearing aids in place.  Head:  Normocephalic and atraumatic. Abdomen:  Bowel sounds present, soft, non-distended. Mild TTP in RLQ. No HSM or hernias noted. No rebound or guarding. No masses appreciated  Extremities:  Without edema. Neurologic:  Grossly normal neurologically. Psych:  Normal mood and affect.   Intake/Output from previous day: 10/18 0701 - 10/19 0700 In: 1040.2 [I.V.:1040.2] Out: 200 [Urine:200] Intake/Output this shift: No intake/output data recorded.  Lab Results: Recent Labs    03/07/20 1900 03/08/20 0431 03/08/20 1615  WBC 8.4 6.7 6.6  HGB 13.9 12.2* 12.4*  HCT 42.3 37.2* 36.6*  PLT 293 239 234   BMET Recent Labs    03/07/20 1900 03/08/20 0431 03/09/20 0625  NA 130* 129* 131*  K 4.9  4.5 4.7  CL 92* 95* 99  CO2 24 23 22   GLUCOSE 98 86 69*  BUN 22 22 23   CREATININE 1.42* 1.28* 1.38*  CALCIUM 9.8 8.9 8.6*   LFT Recent Labs    03/07/20 1900 03/08/20 0431  PROT 8.3* 6.1*  ALBUMIN 4.3 3.3*  AST 24 20  ALT 16 13  ALKPHOS 111 85  BILITOT 1.2 1.0   Studies/Results: Abd 1 View (KUB)  Result Date: 03/08/2020 CLINICAL DATA:  Constipation.  Symptoms for 5 weeks. EXAM: ABDOMEN - 1 VIEW COMPARISON:  Abdominal radiographs 03/07/2020.  CT 03/07/2020 FINDINGS: Diffusely gas-filled colon with some scattered small bowel gas. No significant small or large bowel distention. Changes likely to represent ileus. Similar appearance to previous study. Residual contrast material in the bladder. IMPRESSION: Prominent gas-filled colon, likely due to ileus. Electronically Signed   By: 03/09/2020 M.D.   On: 03/08/2020 04:07   CT ABDOMEN PELVIS W CONTRAST  Result Date: 03/07/2020 CLINICAL DATA:  Abdominal distention. Severe lower and mid abdominal pain and cramping. EXAM: CT ABDOMEN AND PELVIS WITH CONTRAST TECHNIQUE: Multidetector CT imaging of the abdomen and pelvis was performed using the standard protocol following bolus administration of intravenous contrast. CONTRAST:  03/10/2020 OMNIPAQUE IOHEXOL 300 MG/ML  SOLN COMPARISON:  Abdominal radiographs 03/07/2020. CT abdomen and pelvis 01/28/2020 FINDINGS: Lower chest: Mild peripheral interstitial changes, likely mild fibrosis. Hepatobiliary: No focal liver abnormality is seen. No gallstones, gallbladder wall thickening, or biliary dilatation. Pancreas: Unremarkable. No  pancreatic ductal dilatation or surrounding inflammatory changes. Spleen: Normal in size without focal abnormality. Adrenals/Urinary Tract: Adrenal glands are unremarkable. Kidneys are normal, without renal calculi, focal lesion, or hydronephrosis. Bladder is unremarkable. Stomach/Bowel: Mild prominence of gas-filled colon without wall thickening. Likely ileus. Stomach and small  bowel are not abnormally distended. Appendix is normal. Vascular/Lymphatic: Aortic atherosclerosis. No enlarged abdominal or pelvic lymph nodes. Reproductive: Prostate gland is enlarged. Other: Small amount of free fluid in the pelvis is likely reactive. No free air. Abdominal wall musculature appears intact. Postoperative changes in the groins consistent with previous hernia repair. Musculoskeletal: Lumbar scoliosis convex towards the left. Degenerative changes in the lumbar spine and hips. No destructive bone lesions. IMPRESSION: 1. Mild prominence of gas-filled colon without wall thickening. Likely ileus. No evidence of bowel obstruction. 2. Small amount of free fluid in the pelvis is likely reactive. 3. Prostate gland is enlarged. 4. Aortic atherosclerosis. 5. Mild peripheral interstitial lung changes, likely fibrosis. Aortic Atherosclerosis (ICD10-I70.0). Electronically Signed   By: Burman Nieves M.D.   On: 03/07/2020 22:17   DG ABD ACUTE 2+V W 1V CHEST  Result Date: 03/07/2020 CLINICAL DATA:  Chest pain.  Constipation.  Foreign body in rectum. EXAM: DG ABDOMEN ACUTE W/ 1V CHEST COMPARISON:  02/14/2020 FINDINGS: Heart is normal size.  No confluent opacities or effusions. Mild diffuse gaseous distention of bowel. No evidence of bowel obstruction. Moderate stool burden in the right colon. No organomegaly, free air, suspicious calcification or visible radiopaque foreign body. IMPRESSION: Mild diffuse gaseous distention of bowel. Moderate stool burden in the right colon. No acute cardiopulmonary disease. Electronically Signed   By: Charlett Nose M.D.   On: 03/07/2020 18:40    Assessment: 86 year old male with long-standing history of constipation, presenting to outside facility [facility]) on 3 separate occasions in September with abdominal pain, constipation, colonic ileus, now presenting to the ED yesterday with constipation and patient concerned for retained enema tip from 5 weeks ago.   CT  abd/pelvis with contrast yesterday noting mild prominence of gas-filled colon without wall thickening, likely ileus. Moderate stool burden in right colon on abdominal films yesterday. Patient notes using a bottle with a nipple tip (Livestock bottle used to feed calves) to administer soap suds enema approximately 5 weeks ago, somewhere in the timeline of his frequent visits to [facility], and when dislodging this, the nipple was not present. He has since had BMs, using OTC laxative therapy (unknown products). Rectal exam completed at bedside 10/18, no fecal impaction.   He has had very small loose/watery BMs yesterday and today. Continues to pass flatus.  Reports mild lower abdominal pain like he needs to have a bowel movement.  Couple episodes of very short-lived nausea without vomiting.  Tolerating clear liquids well.  Abdominal exam with minimal tenderness palpation in the right lower quadrant.  Etiology of ileus is not clear. Mild hyponatremia at presentation.  Clinically, he is not obstructed.  Per prior notes, CT was reviewed with Dr. Pia Mau who stated the nipple tip would likely have shown up on the CT. Less likely that this is still retained. Will continue to work helping patient move his bowels. Would likely benefit from getting up and walking more.  Per Lewie Loron, NP's discussion with Dr. Jena Gauss yesterday, no indication for colonoscopy at this time.  Plan: Continue clear liquids.  Encourage ambulation.  Continue to correct electrolyte abnormalities per hospitalist. Continue Linzess 145 mcg daily. Add MiraLAX 17 g daily. Repeat abdominal x-ray tomorrow. No role for inpatient  colonoscopy at this time.   LOS: 1 day    03/09/2020, 3:43 PM   Ermalinda Memos, PA-C [facility]  Gastroenterology attending attestation note: I personally reviewed the medical chart.  I agree with Mrs. Gwendalyn Ege Harper's H/P and assessment.  Briefly, this is a 86 year old With past medical  history of GERD and hypertension, who came to the hospital after presenting significant constipation.  He perform some enemas at home, reports that he believes that his nipple tip was dislodged and may be got trapped in his rectal area.  He was concerned as he has been presenting worsening constipation and abdominal distention for the last 5 weeks.  Patient underwent abdominal imaging on admission which showed and a CT scan of the abdomen and pelvis with IV contrast presence of colonic distention without wall thickening or presence of transition point.  Findings were concerning for ileus.  The patient underwent a repeat x-ray yesterday which showed presence of diffuse amount of gas in the colon but no presence of concerning distention.  The patient was started on Linzess 145 mcg with some improvement of his symptoms but he still presenting very scant bowel movements.  However, clinically his abdomen is not distended and nontender.  He has been able to tolerate liquid diet so far.  At this point, he should be continued on a liquid diet and advance if he is able to tolerate. Will need to obtain a repeat AXR in the AM.  Katrinka Blazing, MD Gastroenterology and Hepatology [facility]

## 2020-03-10 ENCOUNTER — Inpatient Hospital Stay (HOSPITAL_COMMUNITY): Payer: Medicare Other

## 2020-03-10 DIAGNOSIS — E871 Hypo-osmolality and hyponatremia: Secondary | ICD-10-CM

## 2020-03-10 DIAGNOSIS — K5900 Constipation, unspecified: Secondary | ICD-10-CM

## 2020-03-10 DIAGNOSIS — M109 Gout, unspecified: Secondary | ICD-10-CM

## 2020-03-10 DIAGNOSIS — K567 Ileus, unspecified: Secondary | ICD-10-CM

## 2020-03-10 DIAGNOSIS — K219 Gastro-esophageal reflux disease without esophagitis: Secondary | ICD-10-CM

## 2020-03-10 LAB — CBC WITH DIFFERENTIAL/PLATELET
Abs Immature Granulocytes: 0.03 10*3/uL (ref 0.00–0.07)
Basophils Absolute: 0 10*3/uL (ref 0.0–0.1)
Basophils Relative: 0 %
Eosinophils Absolute: 0 10*3/uL (ref 0.0–0.5)
Eosinophils Relative: 0 %
HCT: 33.3 % — ABNORMAL LOW (ref 39.0–52.0)
Hemoglobin: 11.3 g/dL — ABNORMAL LOW (ref 13.0–17.0)
Immature Granulocytes: 1 %
Lymphocytes Relative: 17 %
Lymphs Abs: 1 10*3/uL (ref 0.7–4.0)
MCH: 31.1 pg (ref 26.0–34.0)
MCHC: 33.9 g/dL (ref 30.0–36.0)
MCV: 91.7 fL (ref 80.0–100.0)
Monocytes Absolute: 0.5 10*3/uL (ref 0.1–1.0)
Monocytes Relative: 7 %
Neutro Abs: 4.6 10*3/uL (ref 1.7–7.7)
Neutrophils Relative %: 75 %
Platelets: 226 10*3/uL (ref 150–400)
RBC: 3.63 MIL/uL — ABNORMAL LOW (ref 4.22–5.81)
RDW: 12.7 % (ref 11.5–15.5)
WBC: 6.1 10*3/uL (ref 4.0–10.5)
nRBC: 0 % (ref 0.0–0.2)

## 2020-03-10 LAB — BASIC METABOLIC PANEL
Anion gap: 11 (ref 5–15)
BUN: 28 mg/dL — ABNORMAL HIGH (ref 8–23)
CO2: 20 mmol/L — ABNORMAL LOW (ref 22–32)
Calcium: 8.7 mg/dL — ABNORMAL LOW (ref 8.9–10.3)
Chloride: 97 mmol/L — ABNORMAL LOW (ref 98–111)
Creatinine, Ser: 1.49 mg/dL — ABNORMAL HIGH (ref 0.61–1.24)
GFR, Estimated: 39 mL/min — ABNORMAL LOW (ref >60)
Glucose, Bld: 97 mg/dL (ref 70–99)
Potassium: 4.9 mmol/L (ref 3.5–5.1)
Sodium: 128 mmol/L — ABNORMAL LOW (ref 135–145)

## 2020-03-10 MED ORDER — AMLODIPINE BESYLATE 5 MG PO TABS: 5.0000 mg | ORAL_TABLET | Freq: Every day | ORAL | Status: DC

## 2020-03-10 MED ORDER — AMLODIPINE BESYLATE 5 MG PO TABS
5.0000 mg | ORAL_TABLET | Freq: Every day | ORAL | Status: DC
  Administered 2020-03-10 – 2020-03-11 (×2): 5 mg via ORAL
  Filled 2020-03-10: qty 1

## 2020-03-10 MED ORDER — POLYETHYLENE GLYCOL 3350 17 G PO PACK
17.0000 g | PACK | Freq: Two times a day (BID) | ORAL | Status: DC
  Administered 2020-03-10 – 2020-03-11 (×3): 17 g via ORAL
  Filled 2020-03-10 (×2): qty 1

## 2020-03-10 MED ORDER — COLCHICINE 0.3 MG HALF TABLET
0.3000 mg | ORAL_TABLET | Freq: Every day | ORAL | Status: DC
  Filled 2020-03-10 (×3): qty 1

## 2020-03-10 MED ORDER — COLCHICINE 0.6 MG PO TABS
0.3000 mg | ORAL_TABLET | Freq: Every day | ORAL | Status: DC
  Administered 2020-03-10 – 2020-03-11 (×2): 0.3 mg via ORAL
  Filled 2020-03-10 (×2): qty 1
  Filled 2020-03-10 (×3): qty 0.5

## 2020-03-10 MED ORDER — SODIUM CHLORIDE 0.9 % IV SOLN: INTRAVENOUS | Status: DC

## 2020-03-10 NOTE — Progress Notes (Signed)
GI Inpatient Follow-up Note  Patient Identification: Hector Lowe is a 86 y.o. male seen for evaluation of constipation with ileus and possible foreign body in rectum/colon.  Subjective: He had 2 small stools this morning but still feels like he is not emptying completely. Denies pain- states his abdominal discomfort is "a nussance" but not severe anymore. he does feel some better after he is able to have a small stool. Endorses some early satiety but denies nausea vomiting. Unsure if he has lost any weight recently. States lives alone but family checks on him often.  Scheduled Inpatient Medications:  . colchicine  0.3 mg Oral Daily  . heparin  5,000 Units Subcutaneous Q8H  . linaclotide  145 mcg Oral QAC breakfast  . lisinopril  10 mg Oral Daily  . polyethylene glycol  17 g Oral Daily  . predniSONE  40 mg Oral Q breakfast    Continuous Inpatient Infusions:   . sodium chloride      PRN Inpatient Medications:  acetaminophen **OR** acetaminophen, ondansetron **OR** ondansetron (ZOFRAN) IV  Review of Systems: Constitutional: Weight is stable.  Eyes: No changes in vision. ENT: No oral lesions, sore throat.  GI: see HPI.  Heme/Lymph: No easy bruising.  CV: No chest pain.  GU: No hematuria.  Integumentary: No rashes.  Neuro: No headaches.  Psych: No depression/anxiety.  Endocrine: No heat/cold intolerance.  Allergic/Immunologic: No urticaria.  Resp: No cough, SOB.  Musculoskeletal: No joint swelling.    Physical Examination: BP (!) 153/81 (BP Location: Left Arm)   Pulse 61   Temp 97.7 F (36.5 C) (Oral)   Resp 20   Ht 5\' 7"  (1.702 m)   Wt 50 kg   SpO2 100%   BMI 17.26 kg/m  Gen: NAD, alert and oriented x 4 HEENT: PEERLA, EOMI, Neck: supple, no JVD or thyromegaly Chest: CTA bilaterally, no wheezes, crackles, or other adventitious sounds CV: RRR, no m/g/c/r Abd: soft, NT, ND, +BS in all four quadrants; no HSM, guarding, ridigity, or rebound tenderness Ext: no  edema, well perfused with 2+ pulses, Skin: no rash or lesions noted Lymph: no LAD  Data: Lab Results  Component Value Date   WBC 6.1 03/10/2020   HGB 11.3 (L) 03/10/2020   HCT 33.3 (L) 03/10/2020   MCV 91.7 03/10/2020   PLT 226 03/10/2020   Recent Labs  Lab 03/08/20 0431 03/08/20 1615 03/10/20 0447  HGB 12.2* 12.4* 11.3*   Lab Results  Component Value Date   NA 128 (L) 03/10/2020   K 4.9 03/10/2020   CL 97 (L) 03/10/2020   CO2 20 (L) 03/10/2020   BUN 28 (H) 03/10/2020   CREATININE 1.49 (H) 03/10/2020   Lab Results  Component Value Date   ALT 13 03/08/2020   AST 20 03/08/2020   ALKPHOS 85 03/08/2020   BILITOT 1.0 03/08/2020   No results for input(s): APTT, INR, PTT in the last 168 hours.    03/10/20 abd xray today - IMPRESSION: Persistent diffuse gaseous distension of the colon, similar to prior.   03/07/20 CT abd/pelvis - IMPRESSION: 1. Mild prominence of gas-filled colon without wall thickening. Likely ileus. No evidence of bowel obstruction. 2. Small amount of free fluid in the pelvis is likely reactive. 3. Prostate gland is enlarged. 4. Aortic atherosclerosis. 5. Mild peripheral interstitial lung changes, likely fibrosis.  Assessment/Plan: Mr. Gervasi is a 86 y.o. male admitted on 03/07/20 for persistent constipation and abd pain.    1. Constipation - has had 3 ER  visits for constipation in past month. Tried enema via livestock bottle with questionable retained nipple but CT negative for foreign body. Started on Linzess at admission in addition to daily miralax. Not on narcotics. Some improvement but not resolution of symptoms - will increase miralax to BID and advance his diet to regular foods. Xray reviewed by Dr Karilyn Cota w/ some improvement.   2. Hyponatremia - slightly worsening, being managed by primary team.   3.Gout- being followed by hospital team.   Recommendations: -Increase miralax to BID -Advance diet from CLD to regular diet.   Case  discussed w/ Dr Karilyn Cota.  Please call with questions or concerns.    Bluford Kaufmann, PA-C [facility]

## 2020-03-10 NOTE — Plan of Care (Signed)

## 2020-03-10 NOTE — Progress Notes (Signed)
[name]  NTI:[id number] DOB: 02-27-1922 DOA: 03/07/2020 PCP: Richardean Chimera, MD  Brief Narrative:   86 year old white male HTN DM, reflux, gout  Several [place] emergency room visits 9/11, 9/25 with constipation treated with fleets enema Admitted 03/07/2020 with concerns for retained foreign body from part of the enema container CT scan showed ileus with no signs of obstruction and given concern for constipation and reported foreign body patient was admitted and GI was consulted During hospitalization found to have concerns for gout   Assessment & Plan:   Active Problems:   Ileus (HCC)   Constipation   Hyponatremia   Essential hypertension   CKD (chronic kidney disease) stage 3, GFR 30-59 ml/min (HCC)   GERD (gastroesophageal reflux disease)   Nicotine dependence, chewing tobacco, w oth disorders   Gout flare   1. Ileus, constipation, foreign body a. Unlikely to have foreign body b. Personal review of x-ray shows continued dilated loops of bowel but somewhat improved from prior on 10/19 c. Defer to gastroenterolog who started Linzess and MiraLAX--??  Addition Reglan low-dose d. Deferred graduation of DIET to gastroenterology 2. Gout a. Continue steroids prednisone 40 and taper rapidly b. Started colchicine renally dosed 0.3 mg daily and can switch to every 48 hours if kidney function worsens c. Have discontinued Toradol 3. HTN a. Continue lisinopril 10 and replaced with amlodipine 5 this admission 4. CKD 3B a. Monitor trends over the next several days 5. Euvolemic hyponatremia a. Continue saline cut back rate to 50 cc an hour continuously for now b. Repeat labs in a.m. 6. Reflux 7. Nicotine dependence  DVT prophylaxis: Heparin Code Status: DNR Family Communication: Called both numbers in the chart but no response Disposition:   Status is: Inpatient  Remains inpatient appropriate because:Persistent severe electrolyte disturbances,  Ongoing active pain requiring inpatient pain management, Ongoing diagnostic testing needed not appropriate for outpatient work up and Unsafe d/c plan   Dispo: The patient is from: Home              Anticipated d/c is to: Home              Anticipated d/c date is: 2 days              Patient currently is not medically stable to d/c.       Consultants:   Gastroenterology  Procedures: None  Antimicrobials: None   Subjective: Quite hard of hearing pleasant No distress Still complaining of passing very small amounts of stool Has questions about why he is constipated  Objective: Vitals:   03/09/20 1918 03/09/20 2001 03/09/20 2154 03/10/20 0522  BP: (!) 126/57  137/65 (!) 153/81  Pulse: (!) 55  64 61  Resp: 18  20 20   Temp: 97.7 F (36.5 C)     TempSrc: Oral     SpO2: 99% 96% 99% 100%  Weight:      Height:       No intake or output data in the 24 hours ending 03/10/20 1044 Filed Weights   03/07/20 1411 03/08/20 0958  Weight: 53.1 kg 50 kg    Examination:  General exam: EOMI NCAT Respiratory system: Clear no added sound no rales no rhonchi Cardiovascular system: S1-S2 no murmur rub or gallop Gastrointestinal system: slight distension Central nervous system: neuro intact Extremities: no le edema Skin:  No rashg Psychiatry: euthymic pleasant  Data Reviewed: I have personally reviewed following labs and imaging studies Sodium 131-->128  chloride 99--->97 CO2 20 BUNs/creatinine 23/1.3-->28/1.4 WBC 6.1 Hemoglobin 12.4-->11.4   Radiology Studies: DG Abd 1 View  Result Date: 03/10/2020 CLINICAL DATA:  Abdominal pain, ileus EXAM: ABDOMEN - 1 VIEW COMPARISON:  03/08/2020 FINDINGS: Persistent diffuse gaseous distension of the colon, similar to prior. No abnormally dilated loops of small bowel are seen. No gross free intraperitoneal air on portable supine view. Similar degenerative changes of the spine and hips. IMPRESSION: Persistent diffuse gaseous distension of  the colon, similar to prior. Electronically Signed   By: Duanne Guess D.O.   On: 03/10/2020 08:37     Scheduled Meds: . colchicine  0.3 mg Oral Daily  . heparin  5,000 Units Subcutaneous Q8H  . linaclotide  145 mcg Oral QAC breakfast  . lisinopril  10 mg Oral Daily  . polyethylene glycol  17 g Oral Daily  . predniSONE  40 mg Oral Q breakfast   Continuous Infusions: . sodium chloride       LOS: 2 days    Time spent: 40  Rhetta Mura, MD [facility] To contact the attending provider between 7A-7P or the covering provider during after hours 7P-7A, please log into the web site [email or web address] and access using universal [place] password for that web site. If you do not have the password, please call the hospital operator.  03/10/2020, 10:44 AM

## 2020-03-11 ENCOUNTER — Telehealth: Payer: Self-pay | Admitting: Gastroenterology

## 2020-03-11 LAB — CBC WITH DIFFERENTIAL/PLATELET
Abs Immature Granulocytes: 0.02 10*3/uL (ref 0.00–0.07)
Basophils Absolute: 0 10*3/uL (ref 0.0–0.1)
Basophils Relative: 0 %
Eosinophils Absolute: 0 10*3/uL (ref 0.0–0.5)
Eosinophils Relative: 0 %
HCT: 32.9 % — ABNORMAL LOW (ref 39.0–52.0)
Hemoglobin: 10.9 g/dL — ABNORMAL LOW (ref 13.0–17.0)
Immature Granulocytes: 0 %
Lymphocytes Relative: 16 %
Lymphs Abs: 1.3 10*3/uL (ref 0.7–4.0)
MCH: 29.9 pg (ref 26.0–34.0)
MCHC: 33.1 g/dL (ref 30.0–36.0)
MCV: 90.4 fL (ref 80.0–100.0)
Monocytes Absolute: 0.7 10*3/uL (ref 0.1–1.0)
Monocytes Relative: 10 %
Neutro Abs: 5.6 10*3/uL (ref 1.7–7.7)
Neutrophils Relative %: 74 %
Platelets: 226 10*3/uL (ref 150–400)
RBC: 3.64 MIL/uL — ABNORMAL LOW (ref 4.22–5.81)
RDW: 12.7 % (ref 11.5–15.5)
WBC: 7.6 10*3/uL (ref 4.0–10.5)
nRBC: 0 % (ref 0.0–0.2)

## 2020-03-11 LAB — COMPREHENSIVE METABOLIC PANEL
ALT: 14 U/L (ref 0–44)
AST: 23 U/L (ref 15–41)
Albumin: 3 g/dL — ABNORMAL LOW (ref 3.5–5.0)
Alkaline Phosphatase: 67 U/L (ref 38–126)
Anion gap: 9 (ref 5–15)
BUN: 27 mg/dL — ABNORMAL HIGH (ref 8–23)
CO2: 21 mmol/L — ABNORMAL LOW (ref 22–32)
Calcium: 8.6 mg/dL — ABNORMAL LOW (ref 8.9–10.3)
Chloride: 100 mmol/L (ref 98–111)
Creatinine, Ser: 1.35 mg/dL — ABNORMAL HIGH (ref 0.61–1.24)
GFR, Estimated: 44 mL/min — ABNORMAL LOW (ref >60)
Glucose, Bld: 108 mg/dL — ABNORMAL HIGH (ref 70–99)
Potassium: 4.5 mmol/L (ref 3.5–5.1)
Sodium: 130 mmol/L — ABNORMAL LOW (ref 135–145)
Total Bilirubin: 1 mg/dL (ref 0.3–1.2)
Total Protein: 5.7 g/dL — ABNORMAL LOW (ref 6.5–8.1)

## 2020-03-11 MED ORDER — LINACLOTIDE 145 MCG PO CAPS: 145.0000 ug | ORAL_CAPSULE | Freq: Every day | ORAL | 0 refills | Status: DC

## 2020-03-11 MED ORDER — COLCHICINE 0.6 MG PO TABS: 0.3000 mg | ORAL_TABLET | Freq: Every day | ORAL | 0 refills | Status: DC

## 2020-03-11 MED ORDER — AMLODIPINE BESYLATE 5 MG PO TABS: 5.0000 mg | ORAL_TABLET | Freq: Every day | ORAL | 3 refills | Status: DC

## 2020-03-11 MED ORDER — POLYETHYLENE GLYCOL 3350 17 G PO PACK: 17.0000 g | PACK | Freq: Two times a day (BID) | ORAL | 0 refills | Status: DC

## 2020-03-11 NOTE — Telephone Encounter (Signed)
Please schedule hospital follow up (in next 2 weeks or so) for constipation/ileus with Dr. Jena Gauss, Tobi Bastos, or me.

## 2020-03-11 NOTE — Plan of Care (Signed)
  Problem: Education: Goal: Knowledge of General Education information will improve Description: Including pain rating scale, medication(s)/side effects and non-pharmacologic comfort measures 03/11/2020 1202 by Cameron Ali, RN Outcome: Completed/Met 03/11/2020 1032 by Cameron Ali, RN Outcome: Progressing   Problem: Health Behavior/Discharge Planning: Goal: Ability to manage health-related needs will improve 03/11/2020 1202 by Cameron Ali, RN Outcome: Completed/Met 03/11/2020 1032 by Cameron Ali, RN Outcome: Progressing   Problem: Clinical Measurements: Goal: Ability to maintain clinical measurements within normal limits will improve 03/11/2020 1202 by Cameron Ali, RN Outcome: Completed/Met 03/11/2020 1032 by Cameron Ali, RN Outcome: Progressing Goal: Will remain free from infection 03/11/2020 1202 by Cameron Ali, RN Outcome: Completed/Met 03/11/2020 1032 by Cameron Ali, RN Outcome: Progressing Goal: Diagnostic test results will improve 03/11/2020 1202 by Cameron Ali, RN Outcome: Completed/Met 03/11/2020 1032 by Cameron Ali, RN Outcome: Progressing Goal: Respiratory complications will improve 03/11/2020 1202 by Cameron Ali, RN Outcome: Completed/Met 03/11/2020 1032 by Cameron Ali, RN Outcome: Progressing Goal: Cardiovascular complication will be avoided 03/11/2020 1202 by Cameron Ali, RN Outcome: Completed/Met 03/11/2020 1032 by Cameron Ali, RN Outcome: Progressing   Problem: Activity: Goal: Risk for activity intolerance will decrease 03/11/2020 1202 by Cameron Ali, RN Outcome: Completed/Met 03/11/2020 1032 by Cameron Ali, RN Outcome: Progressing   Problem: Nutrition: Goal: Adequate nutrition will be maintained 03/11/2020 1202 by Cameron Ali, RN Outcome: Completed/Met 03/11/2020 1032 by Cameron Ali, RN Outcome: Progressing   Problem: Coping: Goal: Level  of anxiety will decrease 03/11/2020 1202 by Cameron Ali, RN Outcome: Completed/Met 03/11/2020 1032 by Cameron Ali, RN Outcome: Progressing   Problem: Elimination: Goal: Will not experience complications related to bowel motility 03/11/2020 1202 by Cameron Ali, RN Outcome: Completed/Met 03/11/2020 1032 by Cameron Ali, RN Outcome: Progressing Goal: Will not experience complications related to urinary retention 03/11/2020 1202 by Cameron Ali, RN Outcome: Completed/Met 03/11/2020 1032 by Cameron Ali, RN Outcome: Progressing   Problem: Pain Managment: Goal: General experience of comfort will improve 03/11/2020 1202 by Cameron Ali, RN Outcome: Completed/Met 03/11/2020 1032 by Cameron Ali, RN Outcome: Progressing   Problem: Safety: Goal: Ability to remain free from injury will improve 03/11/2020 1202 by Cameron Ali, RN Outcome: Completed/Met 03/11/2020 1032 by Cameron Ali, RN Outcome: Progressing   Problem: Skin Integrity: Goal: Risk for impaired skin integrity will decrease 03/11/2020 1202 by Cameron Ali, RN Outcome: Completed/Met 03/11/2020 1032 by Cameron Ali, RN Outcome: Progressing

## 2020-03-11 NOTE — Care Management Important Message (Addendum)
Important Message  Patient Details  Name: Hector Lowe MRN: [id number] Date of Birth: 04/01/1922   Medicare Important Message Given:  Yes  Crystal, RN agreed to deliver letter   Corey Harold 03/11/2020, 2:12 PM

## 2020-03-11 NOTE — Discharge Summary (Signed)
Physician Discharge Summary  Hector Lowe NLG:[id number] DOB: [birth year] DOA: 03/07/2020  PCP: Richardean Chimera, MD  Admit date: 03/07/2020 Discharge date: 03/11/2020  Time spent: 35 minutes  Recommendations for Outpatient Follow-up:  1. Patient require CBC Chem-12 in 1 week 2. New medication Linzess, amlodipine this admission 3. Continue colchicine short-term for gout flare  Discharge Diagnoses:  Active Problems:   Ileus (HCC)   Constipation   Hyponatremia   Essential hypertension   CKD (chronic kidney disease) stage 3, GFR 30-59 ml/min (HCC)   GERD (gastroesophageal reflux disease)   Nicotine dependence, chewing tobacco, w oth disorders   Gout flare   Discharge Condition: Improved  Diet recommendation: Heart healthy  Filed Weights   03/07/20 1411 03/08/20 0958  Weight: 53.1 kg 50 kg    History of present illness:  85 year old white male HTN DM, reflux, gout             Several [place] emergency room visits 9/11, 9/25 with constipation treated with fleets enema Admitted 03/07/2020 with concerns for retained foreign body from part of the enema container CT scan showed ileus with no signs of obstruction and given concern for constipation and reported foreign body patient was admitted and GI was consulted During hospitalization found to have concerns for gout   Hospital Course:   1. Ileus, constipation, foreign body a. Unlikely to have foreign body b. Personal review of x-ray shows continued dilated loops of bowel but somewhat improved from prior on 10/19 and passed a stool finally on 10/21 c. Linzess in addition to [facility] for continued and patient was discharged on the same d. Patient was discharged on regular diet 2. Gout a. Continue steroids prednisone 40 and taper rapidly b. Started colchicine renally dosed 0.3 mg daily and can switch to every 48 hours if kidney function worsens c. Have discontinued Toradol 3. HTN a. Lisinopril was discontinued and  replaced with amlodipine 5 this admission 4. CKD 3B a. Monitor trends over the next several days 5. Euvolemic hyponatremia likely secondary to excessive fluid intake a. Was on saline during hospital stay b. Kidney function resolved and sodium came up slightly 6. Reflux 7. Nicotine dependencev  Procedures: None Consultations:  Gastroenterology  Discharge Exam: Vitals:   03/10/20 2256 03/11/20 0625  BP: 117/72 130/61  Pulse: (!) 54 (!) 52  Resp: 19 19  Temp: 98.5 F (36.9 C) 98.4 F (36.9 C)  SpO2: 100% 99%    General: Awake alert quite hard of hearing pleasant no distress EOMI NCAT no focal deficit Cardiovascular: S1-S2 no murmur no rub no gallop RRR Respiratory: Clinically clear no added sound no rales rhonchi Abdomen soft right obese no distress  Discharge Instructions   Discharge Instructions    Diet - low sodium heart healthy   Complete by: As directed    Discharge instructions   Complete by: As directed    Make sure that you take the new medication called Linzess which will help with your constipation and drink plenty of water You will need lab work in about 1 week at your primary physician office to ensure that you are well hydrated Please also stop certain medications such as ibuprofen and lisinopril as per your list as these medications may affect your kidneys instead you will also need a blood pressure medication called amlodipine to help with your blood pressure   Increase activity slowly   Complete by: As directed      Allergies as of 03/11/2020   No Known Allergies  Medication List    STOP taking these medications   ibuprofen 200 MG tablet Commonly known as: ADVIL   lisinopril 10 MG tablet Commonly known as: ZESTRIL     TAKE these medications   amLODipine 5 MG tablet Commonly known as: NORVASC Take 1 tablet (5 mg total) by mouth daily. Start taking on: March 12, 2020   calcium carbonate 500 MG chewable tablet Commonly known as: TUMS  - dosed in mg elemental calcium Chew 1 tablet by mouth daily as needed for indigestion or heartburn.   colchicine 0.6 MG tablet Take 0.5 tablets (0.3 mg total) by mouth daily. Start taking on: March 12, 2020   linaclotide 145 MCG Caps capsule Commonly known as: Karlene Einstein Take 1 capsule (145 mcg total) by mouth daily before breakfast. Start taking on: March 12, 2020   omeprazole 20 MG capsule Commonly known as: PRILOSEC Take 20 mg by mouth daily.   polyethylene glycol 17 g packet Commonly known as: MIRALAX / GLYCOLAX Take 17 g by mouth 2 (two) times daily.      No Known Allergies    The results of significant diagnostics from this hospitalization (including imaging, microbiology, ancillary and laboratory) are listed below for reference.    Significant Diagnostic Studies: DG Abd 1 View  Result Date: 03/10/2020 CLINICAL DATA:  Abdominal pain, ileus EXAM: ABDOMEN - 1 VIEW COMPARISON:  03/08/2020 FINDINGS: Persistent diffuse gaseous distension of the colon, similar to prior. No abnormally dilated loops of small bowel are seen. No gross free intraperitoneal air on portable supine view. Similar degenerative changes of the spine and hips. IMPRESSION: Persistent diffuse gaseous distension of the colon, similar to prior. Electronically Signed   By: Duanne Guess D.O.   On: 03/10/2020 08:37   Abd 1 View (KUB)  Result Date: 03/08/2020 CLINICAL DATA:  Constipation.  Symptoms for 5 weeks. EXAM: ABDOMEN - 1 VIEW COMPARISON:  Abdominal radiographs 03/07/2020.  CT 03/07/2020 FINDINGS: Diffusely gas-filled colon with some scattered small bowel gas. No significant small or large bowel distention. Changes likely to represent ileus. Similar appearance to previous study. Residual contrast material in the bladder. IMPRESSION: Prominent gas-filled colon, likely due to ileus. Electronically Signed   By: Burman Nieves M.D.   On: 03/08/2020 04:07   CT ABDOMEN PELVIS W CONTRAST  Result Date:  03/07/2020 CLINICAL DATA:  Abdominal distention. Severe lower and mid abdominal pain and cramping. EXAM: CT ABDOMEN AND PELVIS WITH CONTRAST TECHNIQUE: Multidetector CT imaging of the abdomen and pelvis was performed using the standard protocol following bolus administration of intravenous contrast. CONTRAST:  OMNIPAQUE IOHEXOL 300 MG/ML  SOLN COMPARISON:  Abdominal radiographs 03/07/2020. CT abdomen and pelvis 01/28/2020 FINDINGS: Lower chest: Mild peripheral interstitial changes, likely mild fibrosis. Hepatobiliary: No focal liver abnormality is seen. No gallstones, gallbladder wall thickening, or biliary dilatation. Pancreas: Unremarkable. No pancreatic ductal dilatation or surrounding inflammatory changes. Spleen: Normal in size without focal abnormality. Adrenals/Urinary Tract: Adrenal glands are unremarkable. Kidneys are normal, without renal calculi, focal lesion, or hydronephrosis. Bladder is unremarkable. Stomach/Bowel: Mild prominence of gas-filled colon without wall thickening. Likely ileus. Stomach and small bowel are not abnormally distended. Appendix is normal. Vascular/Lymphatic: Aortic atherosclerosis. No enlarged abdominal or pelvic lymph nodes. Reproductive: Prostate gland is enlarged. Other: Small amount of free fluid in the pelvis is likely reactive. No free air. Abdominal wall musculature appears intact. Postoperative changes in the groins consistent with previous hernia repair. Musculoskeletal: Lumbar scoliosis convex towards the left. Degenerative changes in the lumbar spine and  hips. No destructive bone lesions. IMPRESSION: 1. Mild prominence of gas-filled colon without wall thickening. Likely ileus. No evidence of bowel obstruction. 2. Small amount of free fluid in the pelvis is likely reactive. 3. Prostate gland is enlarged. 4. Aortic atherosclerosis. 5. Mild peripheral interstitial lung changes, likely fibrosis. Aortic Atherosclerosis (ICD10-I70.0). Electronically Signed   By:  Burman NievesWilliam  Stevens M.D.   On: 03/07/2020 22:17   DG ABD ACUTE 2+V W 1V CHEST  Result Date: 03/07/2020 CLINICAL DATA:  Chest pain.  Constipation.  Foreign body in rectum. EXAM: DG ABDOMEN ACUTE W/ 1V CHEST COMPARISON:  02/14/2020 FINDINGS: Heart is normal size.  No confluent opacities or effusions. Mild diffuse gaseous distention of bowel. No evidence of bowel obstruction. Moderate stool burden in the right colon. No organomegaly, free air, suspicious calcification or visible radiopaque foreign body. IMPRESSION: Mild diffuse gaseous distention of bowel. Moderate stool burden in the right colon. No acute cardiopulmonary disease. Electronically Signed   By: Charlett NoseKevin  Dover M.D.   On: 03/07/2020 18:40    Microbiology: Recent Results (from the past 240 hour(s))  Resp Panel by RT PCR (RSV, Flu A&B, Covid) - Nasopharyngeal Swab     Status: None   Collection Time: 03/08/20  1:36 AM   Specimen: Nasopharyngeal Swab  Result Value Ref Range Status   SARS Coronavirus 2 by RT PCR NEGATIVE NEGATIVE Final    Comment: (NOTE) SARS-CoV-2 target nucleic acids are NOT DETECTED.  The SARS-CoV-2 RNA is generally detectable in upper respiratoy specimens during the acute phase of infection. The lowest concentration of SARS-CoV-2 viral copies this assay can detect is 131 copies/mL. A negative result does not preclude SARS-Cov-2 infection and should not be used as the sole basis for treatment or other patient management decisions. A negative result may occur with  improper specimen collection/handling, submission of specimen other than nasopharyngeal swab, presence of viral mutation(s) within the areas targeted by this assay, and inadequate number of viral copies (<131 copies/mL). A negative result must be combined with clinical observations, patient history, and epidemiological information. The expected result is Negative.  Fact Sheet for Patients:  https://www.moore.com/https://www.fda.gov/media/142436/download  Fact Sheet for  Healthcare Providers:  https://www.young.biz/https://www.fda.gov/media/142435/download  This test is no t yet approved or cleared by the [place] FDA and  has been authorized for detection and/or diagnosis of SARS-CoV-2 by FDA under an Emergency Use Authorization (EUA). This EUA will remain  in effect (meaning this test can be used) for the duration of the COVID-19 declaration under Section 564(b)(1) of the Act, 21 U.S.C. section 360bbb-3(b)(1), unless the authorization is terminated or revoked sooner.     Influenza A by PCR NEGATIVE NEGATIVE Final   Influenza B by PCR NEGATIVE NEGATIVE Final    Comment: (NOTE) The Xpert Xpress SARS-CoV-2/FLU/RSV assay is intended as an aid in  the diagnosis of influenza from Nasopharyngeal swab specimens and  should not be used as a sole basis for treatment. Nasal washings and  aspirates are unacceptable for Xpert Xpress SARS-CoV-2/FLU/RSV  testing.  Fact Sheet for Patients: https://www.moore.com/https://www.fda.gov/media/142436/download  Fact Sheet for Healthcare Providers: https://www.young.biz/https://www.fda.gov/media/142435/download  This test is not yet approved or cleared by the [place] FDA and  has been authorized for detection and/or diagnosis of SARS-CoV-2 by  FDA under an Emergency Use Authorization (EUA). This EUA will remain  in effect (meaning this test can be used) for the duration of the  Covid-19 declaration under Section 564(b)(1) of the Act, 21  U.S.C. section 360bbb-3(b)(1), unless the authorization is  terminated  or revoked.    Respiratory Syncytial Virus by PCR NEGATIVE NEGATIVE Final    Comment: (NOTE) Fact Sheet for Patients: https://www.moore.com/  Fact Sheet for Healthcare Providers: https://www.young.biz/  This test is not yet approved or cleared by the [place] FDA and  has been authorized for detection and/or diagnosis of SARS-CoV-2 by  FDA under an Emergency Use Authorization (EUA). This EUA will remain  in effect  (meaning this test can be used) for the duration of the  COVID-19 declaration under Section 564(b)(1) of the Act, 21 U.S.C.  section 360bbb-3(b)(1), unless the authorization is terminated or  revoked. Performed at [facility], [address] [place], [place] [address]      Labs: Basic Metabolic Panel: Recent Labs  Lab 03/07/20 1900 03/08/20 0431 03/09/20 0625 03/10/20 0447 03/11/20 0326  NA 130* 129* 131* 128* 130*  K 4.9 4.5 4.7 4.9 4.5  CL 92* 95* 99 97* 100  CO2 24 23 22  20* 21*  GLUCOSE 98 86 69* 97 108*  BUN 22 22 23  28* 27*  CREATININE 1.42* 1.28* 1.38* 1.49* 1.35*  CALCIUM 9.8 8.9 8.6* 8.7* 8.6*  MG  --  2.0  --   --   --    Liver Function Tests: Recent Labs  Lab 03/07/20 1900 03/08/20 0431 03/11/20 0326  AST 24 20 23   ALT 16 13 14   ALKPHOS 111 85 67  BILITOT 1.2 1.0 1.0  PROT 8.3* 6.1* 5.7*  ALBUMIN 4.3 3.3* 3.0*   No results for input(s): LIPASE, AMYLASE in the last 168 hours. No results for input(s): AMMONIA in the last 168 hours. CBC: Recent Labs  Lab 03/07/20 1900 03/08/20 0431 03/08/20 1615 03/10/20 0447 03/11/20 0326  WBC 8.4 6.7 6.6 6.1 7.6  NEUTROABS 6.0  --   --  4.6 5.6  HGB 13.9 12.2* 12.4* 11.3* 10.9*  HCT 42.3 37.2* 36.6* 33.3* 32.9*  MCV 91.2 92.1 89.3 91.7 90.4  PLT 293 239 234 226 226   Cardiac Enzymes: No results for input(s): CKTOTAL, CKMB, CKMBINDEX, TROPONINI in the last 168 hours. BNP: BNP (last 3 results) No results for input(s): BNP in the last 8760 hours.  ProBNP (last 3 results) No results for input(s): PROBNP in the last 8760 hours.  CBG: No results for input(s): GLUCAP in the last 168 hours.     Signed:  03/10/20 MD   [facility] 03/11/2020, 11:36 AM

## 2020-03-11 NOTE — Plan of Care (Signed)

## 2020-03-16 DIAGNOSIS — K5900 Constipation, unspecified: Secondary | ICD-10-CM | POA: Diagnosis not present

## 2020-03-16 DIAGNOSIS — Z681 Body mass index (BMI) 19 or less, adult: Secondary | ICD-10-CM | POA: Diagnosis not present

## 2020-03-16 DIAGNOSIS — R42 Dizziness and giddiness: Secondary | ICD-10-CM | POA: Diagnosis not present

## 2020-03-16 DIAGNOSIS — K567 Ileus, unspecified: Secondary | ICD-10-CM | POA: Diagnosis not present

## 2020-03-17 ENCOUNTER — Encounter: Payer: Self-pay | Admitting: Internal Medicine

## 2020-03-24 DIAGNOSIS — Z23 Encounter for immunization: Secondary | ICD-10-CM | POA: Diagnosis not present

## 2020-03-24 DIAGNOSIS — E871 Hypo-osmolality and hyponatremia: Secondary | ICD-10-CM | POA: Diagnosis not present

## 2020-04-02 DIAGNOSIS — I10 Essential (primary) hypertension: Secondary | ICD-10-CM | POA: Diagnosis not present

## 2020-04-02 DIAGNOSIS — E871 Hypo-osmolality and hyponatremia: Secondary | ICD-10-CM | POA: Diagnosis not present

## 2020-04-10 DIAGNOSIS — K567 Ileus, unspecified: Secondary | ICD-10-CM | POA: Diagnosis not present

## 2020-04-10 DIAGNOSIS — R42 Dizziness and giddiness: Secondary | ICD-10-CM | POA: Diagnosis not present

## 2020-04-10 DIAGNOSIS — K5900 Constipation, unspecified: Secondary | ICD-10-CM | POA: Diagnosis not present

## 2020-04-10 DIAGNOSIS — Z681 Body mass index (BMI) 19 or less, adult: Secondary | ICD-10-CM | POA: Diagnosis not present

## 2020-04-13 ENCOUNTER — Encounter (INDEPENDENT_AMBULATORY_CARE_PROVIDER_SITE_OTHER): Payer: Medicare Other | Admitting: Ophthalmology

## 2020-04-13 ENCOUNTER — Other Ambulatory Visit: Payer: Self-pay

## 2020-04-13 DIAGNOSIS — I10 Essential (primary) hypertension: Secondary | ICD-10-CM | POA: Diagnosis not present

## 2020-04-13 DIAGNOSIS — H43811 Vitreous degeneration, right eye: Secondary | ICD-10-CM | POA: Diagnosis not present

## 2020-04-13 DIAGNOSIS — H353231 Exudative age-related macular degeneration, bilateral, with active choroidal neovascularization: Secondary | ICD-10-CM | POA: Diagnosis not present

## 2020-04-13 DIAGNOSIS — H35033 Hypertensive retinopathy, bilateral: Secondary | ICD-10-CM

## 2020-04-17 DIAGNOSIS — E871 Hypo-osmolality and hyponatremia: Secondary | ICD-10-CM | POA: Diagnosis not present

## 2020-04-29 ENCOUNTER — Ambulatory Visit: Payer: Medicare Other | Admitting: Gastroenterology

## 2020-04-30 ENCOUNTER — Other Ambulatory Visit: Payer: Self-pay

## 2020-04-30 ENCOUNTER — Encounter: Payer: Self-pay | Admitting: Gastroenterology

## 2020-04-30 ENCOUNTER — Ambulatory Visit: Payer: Medicare Other | Admitting: Gastroenterology

## 2020-04-30 VITALS — BP 163/80 | HR 112 | Temp 97.1°F | Ht 67.0 in | Wt 118.2 lb

## 2020-04-30 DIAGNOSIS — K5900 Constipation, unspecified: Secondary | ICD-10-CM | POA: Diagnosis not present

## 2020-04-30 DIAGNOSIS — K219 Gastro-esophageal reflux disease without esophagitis: Secondary | ICD-10-CM

## 2020-04-30 MED ORDER — OMEPRAZOLE 20 MG PO CPDR: 20.0000 mg | DELAYED_RELEASE_CAPSULE | Freq: Two times a day (BID) | ORAL | 5 refills | Status: DC

## 2020-04-30 NOTE — Patient Instructions (Signed)
For constipation: -Continue MiraLAX 1-2 capfuls in the morning -Continue Linzess 145 mcg in the morning -Add Metamucil 1 to 2 teaspoons in 8 ounces of water in the evening -You can use glycerin suppository 1-2 times per week if needed to stimulate bowel movement -If this regimen is not effective, please call and we will increase Linzess dosage  For GERD: -Increase omeprazole to 20 mg twice daily before meal  Return to the office in 4 months or call sooner if needed.

## 2020-04-30 NOTE — Progress Notes (Signed)
Primary Care Physician: Richardean Chimera, MD  Primary Gastroenterologist:  Roetta Sessions, MD   Chief Complaint  Patient presents with  . Constipation    Uses laxatives/suppositories, tries to have a small BM daily but sometimes he can't. If he doesn't then he feels nauseated and feels bad. No bleeding. Stool is always watery. Says he is not constipated just has a blockage holding him back. Wants to know if he can add metamucil at night since he takes miralax in the mornings. Daughter in law states patient does take linzess daily.  Marland Kitchen HFU    From 03/07/20.  . Gastroesophageal Reflux    Wants to know if he can increase omep to BID if needed. He used to take BID and now is having increase in symptoms (burning sensation) with taking only 1 a day    HPI: Hector Lowe is a 86 y.o. male here for hospital follow-up.  Admitted back in October with complaints of constipation, foreign body in the rectum.  Patient with history of chronic constipation but back in September progressive symptoms.  He had used a bottle for feeding baby calfs for an enema, once he completed the enema/removed the bottle, the nipple was not present.  He was seen on 3 separate occasions at [facility] in September for concern of retained enema tip.  He had multiple imaging studies done including 2 CT scans with no evidence of retained foreign body.  Last CT on October 17 with mild prominence of gas-filled colon without wall thickening, likely ileus.  Moderate stool burden in the right colon on abdominal films.   Patient was started on bowel regimen.  He was discharged from the hospital.  Presents today stating that he continues to have issues moving his bowels.  He has to take something in order to have a BM.  On a daily basis he is taking MiraLAX 2 capfuls in the morning, Linzess 145 mcg in the morning.  If he has not had a bowel movement in greater than 24 hours he generally will try suppository.  He does not want  to become dependent on laxatives.  He denies any hard stool.  No blood in the stool or melena.  He feels like he has trouble emptying his bowels adequately.  When he is not able to have good bowel movement he feels nauseated.  Denies any vomiting.  States he is eating well.  Drinking plenty fluids.  He is having some heartburn symptoms.  Feels like he did better when he took omeprazole twice daily.  Current Outpatient Medications  Medication Sig Dispense Refill  . amLODipine (NORVASC) 5 MG tablet Take 1 tablet (5 mg total) by mouth daily. 30 tablet 3  . colchicine 0.6 MG tablet Take 0.5 tablets (0.3 mg total) by mouth daily. (Patient taking differently: Take 0.3 mg by mouth daily. As needed) 20 tablet 0  . Docusate Calcium (STOOL SOFTENER PO) Take by mouth as needed.    . linaclotide (LINZESS) 145 MCG CAPS capsule Take 1 capsule (145 mcg total) by mouth daily before breakfast. 30 capsule 0  . Multiple Vitamins-Minerals (PRESERVISION AREDS PO) Take by mouth daily.    Marland Kitchen omeprazole (PRILOSEC) 20 MG capsule Take 20 mg by mouth daily.    . polyethylene glycol (MIRALAX / GLYCOLAX) 17 g packet Take 17 g by mouth 2 (two) times daily. (Patient taking differently: Take by mouth in the morning. 2 capfuls) 14 each 0  .  No current facility-administered medications for this visit.    Allergies as of 04/30/2020  . (No Known Allergies)    ROS:  General: Negative for anorexia, weight loss, fever, chills, fatigue, weakness.  Weight is up 8 pounds since he was in the hospital. ENT: Negative for hoarseness, difficulty swallowing , nasal congestion. CV: Negative for chest pain, angina, palpitations, dyspnea on exertion, peripheral edema.  Respiratory: Negative for dyspnea at rest, dyspnea on exertion, cough, sputum, wheezing.  GI: See history of present illness. GU:  Negative for dysuria, hematuria, urinary incontinence, urinary frequency, nocturnal urination.  Endo: Negative for unusual weight change.     Physical Examination:   BP (!) 163/80   Pulse (!) 112   Temp (!) 97.1 F (36.2 C)   Ht 5\' 7"  (1.702 m)   Wt 118 lb 3.2 oz (53.6 kg)   BMI 18.51 kg/m   General: Well-nourished, well-developed in no acute distress.  Hard of hearing.  Accompanied by nephew. Eyes: No icterus. Mouth: masked Lungs: Clear to auscultation bilaterally.  Heart: Regular rate and rhythm, no murmurs rubs or gallops.  Abdomen: Bowel sounds are normal, nontender, nondistended, no hepatosplenomegaly or masses, no abdominal bruits or hernia , no rebound or guarding.   Extremities: No lower extremity edema. No clubbing or deformities. Neuro: Alert and oriented x 4   Skin: Warm and dry, no jaundice.   Psych: Alert and cooperative, normal mood and affect.  Labs:  Lab Results  Component Value Date   CREATININE 1.35 (H) 03/11/2020   BUN 27 (H) 03/11/2020   NA 130 (L) 03/11/2020   K 4.5 03/11/2020   CL 100 03/11/2020   CO2 21 (L) 03/11/2020   Lab Results  Component Value Date   ALT 14 03/11/2020   AST 23 03/11/2020   ALKPHOS 67 03/11/2020   BILITOT 1.0 03/11/2020   Lab Results  Component Value Date   WBC 7.6 03/11/2020   HGB 10.9 (L) 03/11/2020   HCT 32.9 (L) 03/11/2020   MCV 90.4 03/11/2020   PLT 226 03/11/2020   No results found for: IRON, TIBC, FERRITIN No results found for: VITAMINB12 No results found for: FOLATE  Imaging Studies: No results found.  Impression/plan:  Pleasant 86 year old gentleman presenting for follow-up of constipation.  Continues to feel like bowel movements are incomplete.  Worried about having a blockage.  Multiple imaging studies done recently without evidence of obstruction but did have picture of ileus.  Currently moving bowels at least every other day but often has to add to his daily bowel regimen in order to have a BM.  His reflux is also inadequately managed.  1. Continue MiraLAX 1-2 capfuls in the morning. 2. Continue Linzess 145 mcg every morning. 3. Add  Metamucil at bedtime, 1 to 2 teaspoons. 4. Can use glycerin suppository 1-2 times per week if needed to stimulate bowel movement. 5. If this regimen does not help, we can increase Linzess to 290 mcg. 6. Increase omeprazole to 20 mg twice daily before a meal. 7. Return to the office in 4 months or call sooner if needed.  He can continue to follow-up with his PCP if PCP willing to manage prescriptions.

## 2020-05-03 ENCOUNTER — Encounter: Payer: Self-pay | Admitting: Gastroenterology

## 2020-05-03 NOTE — Progress Notes (Signed)
Cc'ed to pcp °

## 2020-05-17 DIAGNOSIS — H9193 Unspecified hearing loss, bilateral: Secondary | ICD-10-CM | POA: Diagnosis not present

## 2020-05-17 DIAGNOSIS — K219 Gastro-esophageal reflux disease without esophagitis: Secondary | ICD-10-CM | POA: Diagnosis not present

## 2020-05-17 DIAGNOSIS — I10 Essential (primary) hypertension: Secondary | ICD-10-CM | POA: Diagnosis not present

## 2020-05-17 DIAGNOSIS — Z0001 Encounter for general adult medical examination with abnormal findings: Secondary | ICD-10-CM | POA: Diagnosis not present

## 2020-05-17 DIAGNOSIS — S39012A Strain of muscle, fascia and tendon of lower back, initial encounter: Secondary | ICD-10-CM | POA: Diagnosis not present

## 2020-05-17 DIAGNOSIS — Z6820 Body mass index (BMI) 20.0-20.9, adult: Secondary | ICD-10-CM | POA: Diagnosis not present

## 2020-07-01 DIAGNOSIS — N183 Chronic kidney disease, stage 3 unspecified: Secondary | ICD-10-CM | POA: Diagnosis not present

## 2020-07-01 DIAGNOSIS — Z6820 Body mass index (BMI) 20.0-20.9, adult: Secondary | ICD-10-CM | POA: Diagnosis not present

## 2020-07-01 DIAGNOSIS — W19XXXA Unspecified fall, initial encounter: Secondary | ICD-10-CM | POA: Diagnosis not present

## 2020-07-01 DIAGNOSIS — H9193 Unspecified hearing loss, bilateral: Secondary | ICD-10-CM | POA: Diagnosis not present

## 2020-07-01 DIAGNOSIS — I10 Essential (primary) hypertension: Secondary | ICD-10-CM | POA: Diagnosis not present

## 2020-07-01 DIAGNOSIS — Z0001 Encounter for general adult medical examination with abnormal findings: Secondary | ICD-10-CM | POA: Diagnosis not present

## 2020-07-01 DIAGNOSIS — H3530 Unspecified macular degeneration: Secondary | ICD-10-CM | POA: Diagnosis not present

## 2020-07-01 DIAGNOSIS — Z23 Encounter for immunization: Secondary | ICD-10-CM | POA: Diagnosis not present

## 2020-08-06 ENCOUNTER — Encounter (INDEPENDENT_AMBULATORY_CARE_PROVIDER_SITE_OTHER): Payer: Medicare Other | Admitting: Ophthalmology

## 2020-09-01 ENCOUNTER — Ambulatory Visit (INDEPENDENT_AMBULATORY_CARE_PROVIDER_SITE_OTHER): Payer: Medicare Other | Admitting: Gastroenterology

## 2020-09-01 ENCOUNTER — Encounter: Payer: Self-pay | Admitting: Gastroenterology

## 2020-09-01 ENCOUNTER — Other Ambulatory Visit: Payer: Self-pay

## 2020-09-01 VITALS — BP 168/81 | HR 77 | Temp 97.1°F | Ht 65.0 in | Wt 121.2 lb

## 2020-09-01 DIAGNOSIS — R1030 Lower abdominal pain, unspecified: Secondary | ICD-10-CM

## 2020-09-01 DIAGNOSIS — K219 Gastro-esophageal reflux disease without esophagitis: Secondary | ICD-10-CM

## 2020-09-01 DIAGNOSIS — K5900 Constipation, unspecified: Secondary | ICD-10-CM | POA: Diagnosis not present

## 2020-09-01 HISTORY — DX: Lower abdominal pain, unspecified: R10.30

## 2020-09-01 NOTE — Progress Notes (Signed)
Primary Care Physician: Richardean Chimera, MD  Primary Gastroenterologist:  Roetta Sessions, MD   Chief Complaint  Patient presents with  . Abdominal Pain    Lower abd, hurts all the time, feels on fire. Times it affects his breathing it hurts so bad  . Constipation    BM's mostly daily, times had urges to have a BM but then will just pass gas. Wants to discuss increase the linzess. Reports BM's are hard to pass at times  . Gastroesophageal Reflux     Doing okay    HPI: Hector Lowe is a 87 y.o. male here for follow-up.  Last seen in December 2021.  History of chronic constipation but worse over the past 6 months.  Also with history of GERD.  Patient presents with son today.  Patient states continues to have issues with constipation.  Feels like he is much better than when he was in the hospital back in September but not back at baseline. At this time he has a bowel movement each day but feels like it is inadequate, incomplete.  Complains of lower abdominal discomfort, described as burning in quality.  Hurts all the time.  For the most part, states it is not that bad but he knows it is there.  Feels like it is improved after a good bowel movement.  Denies dysuria.  Has chronic urinary hesitancy.  Gets up once at night to urinate. Complains of new pain in the lower back, wonders if it is related to his constipation.  He has a history of chronic back issues but not as low as the pain is now.  Considering discussing with his PCP and asking for x-rays.  Current bowel regimen consist of MiraLAX 2 capfuls every morning and 16 ounces of water.  Takes Linzess 145 mcg each morning.  Metamucil 1 tablespoon at bedtime.  Has increased dietary fiber.  Has been using Ex-Lax twice per week to give relief.   Current Outpatient Medications  Medication Sig Dispense Refill  . amLODipine (NORVASC) 5 MG tablet Take 1 tablet (5 mg total) by mouth daily. 30 tablet 3  . colchicine 0.6 MG tablet Take 0.5  tablets (0.3 mg total) by mouth daily. (Patient taking differently: Take 0.3 mg by mouth daily. As needed) 20 tablet 0  . Docusate Calcium (STOOL SOFTENER PO) Take by mouth as needed.    . linaclotide (LINZESS) 145 MCG CAPS capsule Take 1 capsule (145 mcg total) by mouth daily before breakfast. 30 capsule 0  . METAMUCIL FIBER PO Take by mouth. Every night    . Multiple Vitamins-Minerals (PRESERVISION AREDS PO) Take by mouth daily.    Marland Kitchen omeprazole (PRILOSEC) 20 MG capsule Take 1 capsule (20 mg total) by mouth 2 (two) times daily before a meal. 60 capsule 5  . polyethylene glycol (MIRALAX / GLYCOLAX) 17 g packet Take 17 g by mouth 2 (two) times daily. (Patient taking differently: Take by mouth in the morning. 2 capfuls) 14 each 0   No current facility-administered medications for this visit.    Allergies as of 09/01/2020  . (No Known Allergies)    ROS:  General: Negative for anorexia, weight loss, fever, chills, fatigue, weakness. ENT: Negative for hoarseness, difficulty swallowing, nasal congestion. CV: Negative for chest pain, angina, palpitations, dyspnea on exertion, peripheral edema.  Respiratory: Negative for dyspnea at rest, dyspnea on exertion, cough, sputum, wheezing.  GI: See history of present illness. GU:  Negative for dysuria, hematuria, urinary  incontinence, urinary frequency, nocturnal urination.  Endo: Negative for unusual weight change.    Physical Examination:   BP (!) 168/81   Pulse 77   Temp (!) 97.1 F (36.2 C)   Ht 5\' 5"  (1.651 m)   Wt 121 lb 3.2 oz (55 kg)   BMI 20.17 kg/m   General: Well-nourished, well-developed in no acute distress.  Eyes: No icterus. Mouth: masked Lungs: Clear to auscultation bilaterally.  Heart: Regular rate and rhythm, no murmurs rubs or gallops.  Abdomen: Bowel sounds are normal, nontender, nondistended, no hepatosplenomegaly or masses, no abdominal bruits or hernia , no rebound or guarding.   Extremities: No lower extremity  edema. No clubbing or deformities. Neuro: Alert and oriented x 4   Skin: Warm and dry, no jaundice.   Psych: Alert and cooperative, normal mood and affect.    Assessment/plan:  Pleasant 87 year old male presenting for follow-up of constipation/GERD.  Overall bowel movements have improved in the past several months but still not at baseline.  Passes some stool every day but still feels like stools are incomplete or inadequate.  Also with lower abdominal burning, no dysuria.  Often feels better after having a good bowel movement.  Reflux is well controlled.  Discussed with patient and son.  We will further optimize bowel regimen.  Goal of adequate bowel movements on a regular basis.  If he achieves this goal but continues to have sensation of incomplete bowel movement or lower abdominal burning, he will let 94 know.  At that time may require further evaluation.  Patient with recent onset low back pain, may or may not be related to constipation.  If his bowel movements become more regular and adequate but he has persistent low back pain, would recommend him see PCP for evaluation.  1. Increase Linzess to 290 mcg every morning on an empty stomach.  Samples provided to let him try, make sure he tolerates.  If effective and tolerated, they will call and we will send in prescription. 2. For now he will continue MiraLAX 2 capfuls every morning.  If he finds that his stools are too frequent or too loose, he can back down to 1 capful a day or stop altogether. 3. Continue Metamucil at bedtime, his bowel movements become too frequent hard to loose, he will stop Metamucil. 4. Continue omeprazole as before. 5. Call if abdominal pain persist after better management of constipation. 6. See PCP if persisting back pain. 7. Return to the office in 6 months.

## 2020-09-01 NOTE — Patient Instructions (Signed)
1. Increase Linzess to 290 mcg daily on an empty stomach.  Samples provided today.  If you find that they are effective and you tolerate it, please call and we will call in a prescription. 2. You can continue MiraLAX 2 capfuls in the morning and Metamucil at night for now.  If you find that increasing your Linzess please too frequent stools, then cut back on MiraLAX to 1 capful in the morning.  May have to discontinue completely depending on your response to Linzess 290 mcg daily. 3. Please let me know if you continue to have lower abdominal pain even after effective bowel movements. 4. Return to the office in 6 months.

## 2020-10-11 ENCOUNTER — Telehealth: Payer: Self-pay

## 2020-10-11 MED ORDER — LINACLOTIDE 290 MCG PO CAPS: 290.0000 ug | ORAL_CAPSULE | Freq: Every day | ORAL | 11 refills | Status: DC

## 2020-10-11 NOTE — Addendum Note (Signed)
Addended by: Tiffany Kocher on: 10/11/2020 10:26 AM   Modules accepted: Orders

## 2020-10-11 NOTE — Telephone Encounter (Signed)
done

## 2020-10-11 NOTE — Telephone Encounter (Signed)
noted 

## 2020-10-11 NOTE — Telephone Encounter (Signed)
Good Morning, Pt's daughter phoned and advised that when the pt was last seen you advised them to double up on pt's Linzess 145 mcg. It is working with the pt and the daughter wants a Rx of Linzess 290 mcg phoned in because the pt has even used up the samples.

## 2020-10-13 ENCOUNTER — Encounter (INDEPENDENT_AMBULATORY_CARE_PROVIDER_SITE_OTHER): Payer: Medicare Other | Admitting: Ophthalmology

## 2021-01-01 DIAGNOSIS — R200 Anesthesia of skin: Secondary | ICD-10-CM | POA: Diagnosis not present

## 2021-01-01 DIAGNOSIS — R202 Paresthesia of skin: Secondary | ICD-10-CM | POA: Diagnosis not present

## 2021-01-05 DIAGNOSIS — Z23 Encounter for immunization: Secondary | ICD-10-CM | POA: Diagnosis not present

## 2021-01-31 ENCOUNTER — Encounter: Payer: Self-pay | Admitting: Internal Medicine

## 2021-03-04 ENCOUNTER — Ambulatory Visit: Payer: Medicare Other | Admitting: Gastroenterology

## 2021-03-29 ENCOUNTER — Ambulatory Visit (INDEPENDENT_AMBULATORY_CARE_PROVIDER_SITE_OTHER): Payer: Medicare Other | Admitting: Internal Medicine

## 2021-03-29 ENCOUNTER — Other Ambulatory Visit: Payer: Self-pay

## 2021-03-29 ENCOUNTER — Encounter: Payer: Self-pay | Admitting: Internal Medicine

## 2021-03-29 VITALS — BP 163/77 | HR 75 | Temp 97.3°F | Ht 65.0 in | Wt 119.8 lb

## 2021-03-29 DIAGNOSIS — K5900 Constipation, unspecified: Secondary | ICD-10-CM

## 2021-03-29 DIAGNOSIS — K219 Gastro-esophageal reflux disease without esophagitis: Secondary | ICD-10-CM | POA: Diagnosis not present

## 2021-03-29 DIAGNOSIS — R1030 Lower abdominal pain, unspecified: Secondary | ICD-10-CM | POA: Diagnosis not present

## 2021-03-29 NOTE — Patient Instructions (Signed)
It was good to see you again today!  I recommend you continue omeprazole or Prilosec 20 mg twice daily for acid reflux/indigestion.  Continue Linzess 290 capsule daily  Continue MiraLAX 2 capfuls in 16 ounces of water daily  I do not believe Metamucil is helping you much and I recommend you stop that supplement  As discussed, you have a normal abdominal examination today and you are not impacted.  No blood in your stool today.  The cause of your burning sensation is not clear.  Prior CT did not show a cause for your symptoms and I do not recommend repeating that examination  You may benefit from taking a medication that simply lessens the burning in your abdomen  Before starting such medication, please follow-up with Dr. Reuel Boom to get your updated labs and annual physical examination done  We will plan to see you back in the office here in 3 months

## 2021-03-29 NOTE — Progress Notes (Signed)
Primary Care Physician:  Richardean Chimera, MD Primary Gastroenterologist:  Dr.   Pre-Procedure History & Physical: HPI:  Hector Lowe is a 87 y.o. male here for 2-year history of epigastric  / lower abdominal "burning sensation".  Says it is with him all the time.  Chronically constipated but doing better on Linzess 290 and 34 g of MiraLAX in the morning along with Metamucil in the evening.  Cannot tell much whether Metamucil is helping or not.  By patient and family report, he is having at least 1 bowel movement daily.  There has been no melena or rectal bleeding.  He has a history of mild colonic ileus on plain films previously.  CT scan 1 year ago demonstrated mildly prominent gas-filled colon.  Mild BPH, aortic atherosclerosis, mild peripheral lung disease.  No hernia or other relevant abnormality.    Chronic reflux symptoms now controlled with omeprazole 20 mg twice daily.  No dysphagia.  No postprandial abdominal pain.    He is down 2 pounds since his last visit in April of this year.  Patient and family says he takes his time to eat.  Does not eat as much as he has in the past.  Again, no pain related to meals.  No difficulties there.  Sleeps about 7 to 8 hours nightly.  History of chronic hyponatremia and kidney disease.  With last labs about a year ago through our system.  He is due to see Dr. Reuel Boom in the near future for follow-up.   Past Medical History:  Diagnosis Date   Acid reflux    Hypertension     Past Surgical History:  Procedure Laterality Date   EYE SURGERY     HERNIA REPAIR      Prior to Admission medications   Medication Sig Start Date End Date Taking? Authorizing Provider  amLODipine (NORVASC) 5 MG tablet Take 1 tablet (5 mg total) by mouth daily. 03/12/20  Yes Rhetta Mura, MD  colchicine 0.6 MG tablet Take 0.5 tablets (0.3 mg total) by mouth daily. Patient taking differently: Take 0.3 mg by mouth daily. As needed 03/12/20  Yes Rhetta Mura, MD  Docusate Calcium (STOOL SOFTENER PO) Take by mouth as needed.   Yes [provider]  linaclotide Karlene Einstein) 290 MCG CAPS capsule Take 1 capsule (290 mcg total) by mouth daily before breakfast. 10/11/20  Yes Tiffany Kocher, PA-C  METAMUCIL FIBER PO Take by mouth. Every night   Yes [provider]  Multiple Vitamins-Minerals (PRESERVISION AREDS PO) Take by mouth daily.   Yes [provider]  omeprazole (PRILOSEC) 20 MG capsule Take 1 capsule (20 mg total) by mouth 2 (two) times daily before a meal. 04/30/20  Yes Tiffany Kocher, PA-C  polyethylene glycol (MIRALAX / GLYCOLAX) 17 g packet Take 17 g by mouth 2 (two) times daily. Patient taking differently: Take by mouth in the morning. 2 capfuls 03/11/20  Yes Rhetta Mura, MD    Allergies as of 03/29/2021   (No Known Allergies)    Family History  Problem Relation Age of Onset   Colon cancer Neg Hx     Social History   Socioeconomic History   Marital status: Widowed    Spouse name: Not on file   Number of children: Not on file   Years of education: Not on file   Highest education level: Not on file  Occupational History   Not on file  Tobacco Use   Smoking status: Never  Smokeless tobacco: Current    Types: Chew  Vaping Use   Vaping Use: Never used  Substance and Sexual Activity   Alcohol use: No   Drug use: No   Sexual activity: Not on file  Other Topics Concern   Not on file  Social History Narrative   Not on file   Social Determinants of Health   Financial Resource Strain: Not on file  Food Insecurity: Not on file  Transportation Needs: Not on file  Physical Activity: Not on file  Stress: Not on file  Social Connections: Not on file  Intimate Partner Violence: Not on file    Review of Systems: See HPI, otherwise negative ROS  Physical Exam: BP (!) 163/77   Pulse 75   Temp (!) 97.3 F (36.3 C)   Ht 5\' 5"  (1.651 m)   Wt 119 lb 12.8 oz (54.3 kg)   BMI 19.94  kg/m  General:   Alert, spry, well oriented and conversant gentleman.  Accompanied by son-in-law.   Lungs:  Clear throughout to auscultation.   No wheezes, crackles, or rhonchi. No acute distress. Heart:  Regular rate and rhythm; no murmurs, clicks, rubs,  or gallops. Abdomen: Non-distended, normal bowel sounds.  Soft and nontender without appreciable mass or hepatosplenomegaly.  Pulses:  Normal pulses noted. Extremities:  Without clubbing or edema. Rectal: No external lesions.  Good sphincter tone.  No mass in the rectal vault.  No stool in the rectal vault.  Mucus is Hemoccult negative.  Impression/Plan: Very pleasant 87 year old gentleman who remains relatively mentally sharp with longstanding GERD - well-controlled on omeprazole 20 mg twice daily.  Constipation also doing well on Linzess 290, double dose MiraLAX and Metamucil.  I am not sure Metamucil is making much difference.  His regimen is achieving at least 1 bowel movement daily.  What I believe is a separate issue is abdominal wall "burning".  Incessant.  Not affected by eating having a bowel movement or position. Scenario does not sound anything like mesenteric ischemia or occult hernia.  CT findings and physical examination are reassuring.  Patient is fixated on the complaint of abdominal burning.  There may well be some functionality here.  Recommendations:  I recommend continue omeprazole or Prilosec 20 mg twice daily for acid reflux/indigestion.  Continue Linzess 290 capsule daily  Continue MiraLAX 2 capfuls in 16 ounces of water daily  I do not believe Metamucil is helping much and I recommend that supplement be stopped.  Further imaging not recommended at this time.  I certainly do not recommend a colonoscopy.  Patient may benefit from a trial of very low-dose tricyclic antidepressant therapy to blunt afferent pain pathways.  However, before starting such medication, I have advised the patient to follow-up with Dr.  94 in the near future for his annual physical and have updated labs performed prior to making further recommendations.     Notice: This dictation was prepared with Dragon dictation along with smaller phrase technology. Any transcriptional errors that result from this process are unintentional and may not be corrected upon review.

## 2021-06-10 ENCOUNTER — Inpatient Hospital Stay (HOSPITAL_COMMUNITY)
Admission: EM | Disposition: A | Discharge status: Home / Self Care | Payer: Self-pay | Source: Home / Self Care | Attending: Cardiovascular Disease

## 2021-06-10 ENCOUNTER — Emergency Department (HOSPITAL_COMMUNITY): Payer: Medicare Other

## 2021-06-10 ENCOUNTER — Inpatient Hospital Stay (HOSPITAL_COMMUNITY): Payer: Medicare Other

## 2021-06-10 ENCOUNTER — Inpatient Hospital Stay (HOSPITAL_COMMUNITY)
Admission: EM | Admit: 2021-06-10 | Discharge: 2021-06-12 | DRG: 281 | Disposition: A | Discharge status: Home / Self Care | Payer: Medicare Other | Attending: Cardiovascular Disease | Admitting: Cardiovascular Disease

## 2021-06-10 ENCOUNTER — Encounter (HOSPITAL_COMMUNITY): Payer: Self-pay | Admitting: Emergency Medicine

## 2021-06-10 ENCOUNTER — Other Ambulatory Visit: Payer: Self-pay

## 2021-06-10 DIAGNOSIS — I129 Hypertensive chronic kidney disease with stage 1 through stage 4 chronic kidney disease, or unspecified chronic kidney disease: Secondary | ICD-10-CM | POA: Diagnosis present

## 2021-06-10 DIAGNOSIS — K5909 Other constipation: Secondary | ICD-10-CM | POA: Diagnosis present

## 2021-06-10 DIAGNOSIS — Z20822 Contact with and (suspected) exposure to covid-19: Secondary | ICD-10-CM | POA: Diagnosis present

## 2021-06-10 DIAGNOSIS — Z79899 Other long term (current) drug therapy: Secondary | ICD-10-CM | POA: Diagnosis not present

## 2021-06-10 DIAGNOSIS — N179 Acute kidney failure, unspecified: Secondary | ICD-10-CM | POA: Diagnosis present

## 2021-06-10 DIAGNOSIS — Z743 Need for continuous supervision: Secondary | ICD-10-CM | POA: Diagnosis not present

## 2021-06-10 DIAGNOSIS — K219 Gastro-esophageal reflux disease without esophagitis: Secondary | ICD-10-CM | POA: Diagnosis not present

## 2021-06-10 DIAGNOSIS — R079 Chest pain, unspecified: Secondary | ICD-10-CM

## 2021-06-10 DIAGNOSIS — R6889 Other general symptoms and signs: Secondary | ICD-10-CM | POA: Diagnosis not present

## 2021-06-10 DIAGNOSIS — N183 Chronic kidney disease, stage 3 unspecified: Secondary | ICD-10-CM | POA: Diagnosis present

## 2021-06-10 DIAGNOSIS — E785 Hyperlipidemia, unspecified: Secondary | ICD-10-CM | POA: Diagnosis not present

## 2021-06-10 DIAGNOSIS — I499 Cardiac arrhythmia, unspecified: Secondary | ICD-10-CM | POA: Diagnosis not present

## 2021-06-10 DIAGNOSIS — I214 Non-ST elevation (NSTEMI) myocardial infarction: Principal | ICD-10-CM

## 2021-06-10 DIAGNOSIS — I700 Atherosclerosis of aorta: Secondary | ICD-10-CM | POA: Diagnosis not present

## 2021-06-10 DIAGNOSIS — R0689 Other abnormalities of breathing: Secondary | ICD-10-CM | POA: Diagnosis not present

## 2021-06-10 DIAGNOSIS — I491 Atrial premature depolarization: Secondary | ICD-10-CM | POA: Diagnosis not present

## 2021-06-10 DIAGNOSIS — I10 Essential (primary) hypertension: Secondary | ICD-10-CM | POA: Diagnosis not present

## 2021-06-10 DIAGNOSIS — I2510 Atherosclerotic heart disease of native coronary artery without angina pectoris: Secondary | ICD-10-CM

## 2021-06-10 DIAGNOSIS — R109 Unspecified abdominal pain: Secondary | ICD-10-CM | POA: Diagnosis not present

## 2021-06-10 HISTORY — DX: Non-ST elevation (NSTEMI) myocardial infarction: I21.4

## 2021-06-10 HISTORY — PX: LEFT HEART CATH AND CORONARY ANGIOGRAPHY: CATH118249

## 2021-06-10 LAB — RESP PANEL BY RT-PCR (FLU A&B, COVID) ARPGX2
Influenza A by PCR: NEGATIVE
Influenza B by PCR: NEGATIVE
SARS Coronavirus 2 by RT PCR: NEGATIVE

## 2021-06-10 LAB — COMPREHENSIVE METABOLIC PANEL
ALT: 17 U/L (ref 0–44)
AST: 26 U/L (ref 15–41)
Albumin: 4 g/dL (ref 3.5–5.0)
Alkaline Phosphatase: 141 U/L — ABNORMAL HIGH (ref 38–126)
Anion gap: 11 (ref 5–15)
BUN: 19 mg/dL (ref 8–23)
CO2: 22 mmol/L (ref 22–32)
Calcium: 8.9 mg/dL (ref 8.9–10.3)
Chloride: 103 mmol/L (ref 98–111)
Creatinine, Ser: 1.48 mg/dL — ABNORMAL HIGH (ref 0.61–1.24)
GFR, Estimated: 43 mL/min — ABNORMAL LOW (ref >60)
Glucose, Bld: 118 mg/dL — ABNORMAL HIGH (ref 70–99)
Potassium: 4.5 mmol/L (ref 3.5–5.1)
Sodium: 136 mmol/L (ref 135–145)
Total Bilirubin: 1 mg/dL (ref 0.3–1.2)
Total Protein: 7.4 g/dL (ref 6.5–8.1)

## 2021-06-10 LAB — CBC
HCT: 41.4 % (ref 39.0–52.0)
Hemoglobin: 13.6 g/dL (ref 13.0–17.0)
MCH: 30.6 pg (ref 26.0–34.0)
MCHC: 32.9 g/dL (ref 30.0–36.0)
MCV: 93.2 fL (ref 80.0–100.0)
Platelets: 219 10*3/uL (ref 150–400)
RBC: 4.44 MIL/uL (ref 4.22–5.81)
RDW: 13 % (ref 11.5–15.5)
WBC: 7.9 10*3/uL (ref 4.0–10.5)
nRBC: 0 % (ref 0.0–0.2)

## 2021-06-10 LAB — ECHOCARDIOGRAM COMPLETE
Area-P 1/2: 3.53 cm2
Calc EF: 53.3 %
Height: 65 in
S' Lateral: 2.4 cm
Single Plane A2C EF: 57.7 %
Single Plane A4C EF: 52.6 %
Weight: 1920 oz

## 2021-06-10 LAB — URINALYSIS, ROUTINE W REFLEX MICROSCOPIC
Bacteria, UA: NONE SEEN
Bilirubin Urine: NEGATIVE
Glucose, UA: NEGATIVE mg/dL
Hgb urine dipstick: NEGATIVE
Ketones, ur: NEGATIVE mg/dL
Leukocytes,Ua: NEGATIVE
Nitrite: NEGATIVE
Protein, ur: 30 mg/dL — AB
Specific Gravity, Urine: 1.015 (ref 1.005–1.030)
pH: 5 (ref 5.0–8.0)

## 2021-06-10 LAB — LACTIC ACID, PLASMA
Lactic Acid, Venous: 1.9 mmol/L (ref 0.5–1.9)
Lactic Acid, Venous: 1.1 mmol/L (ref 0.5–1.9)

## 2021-06-10 LAB — TROPONIN I (HIGH SENSITIVITY)
Troponin I (High Sensitivity): 132 ng/L (ref <18)
Troponin I (High Sensitivity): 1704 ng/L (ref <18)

## 2021-06-10 LAB — HEMOGLOBIN A1C
Hgb A1c MFr Bld: 5.5 % (ref 4.8–5.6)
Mean Plasma Glucose: 111 mg/dL

## 2021-06-10 LAB — LIPASE, BLOOD: Lipase: 47 U/L (ref 11–51)

## 2021-06-10 SURGERY — LEFT HEART CATH AND CORONARY ANGIOGRAPHY
Anesthesia: LOCAL

## 2021-06-10 MED ORDER — HEPARIN (PORCINE) IN NACL 1000-0.9 UT/500ML-% IV SOLN
INTRAVENOUS | Status: DC | PRN
  Administered 2021-06-10 (×2): 500 mL

## 2021-06-10 MED ORDER — NITROGLYCERIN 0.4 MG SL SUBL: 0.4000 mg | SUBLINGUAL_TABLET | Freq: Once | SUBLINGUAL | Status: AC

## 2021-06-10 MED ORDER — HEPARIN SODIUM (PORCINE) 1000 UNIT/ML IJ SOLN
INTRAMUSCULAR | Status: DC | PRN
  Administered 2021-06-10: 3000 [IU] via INTRAVENOUS

## 2021-06-10 MED ORDER — SODIUM CHLORIDE 0.9 % IV SOLN: 250.0000 mL | INTRAVENOUS | Status: DC | PRN

## 2021-06-10 MED ORDER — SODIUM CHLORIDE 0.9 % WEIGHT BASED INFUSION
3.0000 mL/kg/h | INTRAVENOUS | Status: DC
  Administered 2021-06-10: 3 mL/kg/h via INTRAVENOUS

## 2021-06-10 MED ORDER — HYDRALAZINE HCL 20 MG/ML IJ SOLN: 10.0000 mg | INTRAMUSCULAR | Status: AC | PRN

## 2021-06-10 MED ORDER — INFLUENZA VAC A&B SA ADJ QUAD 0.5 ML IM PRSY
0.5000 mL | PREFILLED_SYRINGE | INTRAMUSCULAR | Status: DC
Start: 2021-06-11 — End: 2021-06-12
  Filled 2021-06-10: qty 0.5

## 2021-06-10 MED ORDER — ATORVASTATIN CALCIUM 80 MG PO TABS
80.0000 mg | ORAL_TABLET | Freq: Every day | ORAL | Status: DC
  Administered 2021-06-10 – 2021-06-11 (×2): 80 mg via ORAL
  Filled 2021-06-10 (×2): qty 1

## 2021-06-10 MED ORDER — LIDOCAINE HCL (PF) 1 % IJ SOLN
INTRAMUSCULAR | Status: DC | PRN
  Administered 2021-06-10: 2 mL

## 2021-06-10 MED ORDER — VERAPAMIL HCL 2.5 MG/ML IV SOLN
INTRAVENOUS | Status: DC | PRN
  Administered 2021-06-10: 10 mL via INTRA_ARTERIAL

## 2021-06-10 MED ORDER — ACETAMINOPHEN 325 MG PO TABS: 650.0000 mg | ORAL_TABLET | ORAL | Status: DC | PRN

## 2021-06-10 MED ORDER — SODIUM CHLORIDE 0.9 % IV SOLN: INTRAVENOUS | Status: AC

## 2021-06-10 MED ORDER — ASPIRIN 81 MG PO CHEW
81.0000 mg | CHEWABLE_TABLET | Freq: Every day | ORAL | Status: DC
  Administered 2021-06-11 – 2021-06-12 (×2): 81 mg via ORAL
  Filled 2021-06-10 (×2): qty 1

## 2021-06-10 MED ORDER — SODIUM CHLORIDE 0.9 % IV BOLUS
500.0000 mL | Freq: Once | INTRAVENOUS | Status: AC
  Administered 2021-06-10: 500 mL via INTRAVENOUS

## 2021-06-10 MED ORDER — MORPHINE SULFATE (PF) 2 MG/ML IV SOLN
2.0000 mg | Freq: Once | INTRAVENOUS | Status: AC
  Filled 2021-06-10: qty 1

## 2021-06-10 MED ORDER — ASPIRIN EC 81 MG PO TBEC: 81.0000 mg | DELAYED_RELEASE_TABLET | Freq: Every day | ORAL | Status: DC

## 2021-06-10 MED ORDER — LIDOCAINE HCL (PF) 1 % IJ SOLN
INTRAMUSCULAR | Status: AC
  Filled 2021-06-10: qty 30

## 2021-06-10 MED ORDER — ATORVASTATIN CALCIUM 40 MG PO TABS: 40.0000 mg | ORAL_TABLET | Freq: Every day | ORAL | Status: DC

## 2021-06-10 MED ORDER — NITROGLYCERIN 0.4 MG SL SUBL: 0.4000 mg | SUBLINGUAL_TABLET | SUBLINGUAL | Status: DC | PRN

## 2021-06-10 MED ORDER — MORPHINE SULFATE (PF) 2 MG/ML IV SOLN: 1.0000 mg | INTRAVENOUS | Status: DC | PRN

## 2021-06-10 MED ORDER — ISOSORBIDE MONONITRATE ER 30 MG PO TB24
15.0000 mg | ORAL_TABLET | Freq: Every day | ORAL | Status: DC
  Administered 2021-06-10 – 2021-06-12 (×3): 15 mg via ORAL
  Filled 2021-06-10 (×3): qty 1

## 2021-06-10 MED ORDER — HEPARIN (PORCINE) 25000 UT/250ML-% IV SOLN
800.0000 [IU]/h | INTRAVENOUS | Status: DC
  Administered 2021-06-11: 800 [IU]/h via INTRAVENOUS
  Filled 2021-06-10: qty 250

## 2021-06-10 MED ORDER — ASPIRIN 81 MG PO CHEW: 81.0000 mg | CHEWABLE_TABLET | ORAL | Status: DC

## 2021-06-10 MED ORDER — SODIUM CHLORIDE 0.9% FLUSH: 3.0000 mL | INTRAVENOUS | Status: DC | PRN

## 2021-06-10 MED ORDER — IOHEXOL 300 MG/ML  SOLN
100.0000 mL | Freq: Once | INTRAMUSCULAR | Status: AC | PRN
  Administered 2021-06-10: 100 mL via INTRAVENOUS

## 2021-06-10 MED ORDER — SODIUM CHLORIDE 0.9% FLUSH
3.0000 mL | Freq: Two times a day (BID) | INTRAVENOUS | Status: DC
  Administered 2021-06-10 – 2021-06-12 (×2): 3 mL via INTRAVENOUS

## 2021-06-10 MED ORDER — ONDANSETRON HCL 4 MG/2ML IJ SOLN: 4.0000 mg | Freq: Four times a day (QID) | INTRAMUSCULAR | Status: DC | PRN

## 2021-06-10 MED ORDER — METOPROLOL TARTRATE 12.5 MG HALF TABLET
12.5000 mg | ORAL_TABLET | Freq: Two times a day (BID) | ORAL | Status: DC
  Administered 2021-06-10 – 2021-06-12 (×4): 12.5 mg via ORAL
  Filled 2021-06-10 (×4): qty 1

## 2021-06-10 MED ORDER — HEPARIN (PORCINE) IN NACL 1000-0.9 UT/500ML-% IV SOLN
INTRAVENOUS | Status: AC
  Filled 2021-06-10: qty 1000

## 2021-06-10 MED ORDER — HEPARIN (PORCINE) 25000 UT/250ML-% IV SOLN
650.0000 [IU]/h | INTRAVENOUS | Status: DC
  Administered 2021-06-10: 650 [IU]/h via INTRAVENOUS
  Filled 2021-06-10: qty 250

## 2021-06-10 MED ORDER — NITROGLYCERIN 0.4 MG SL SUBL
SUBLINGUAL_TABLET | SUBLINGUAL | Status: AC
  Filled 2021-06-10: qty 1

## 2021-06-10 MED ORDER — HEPARIN SODIUM (PORCINE) 1000 UNIT/ML IJ SOLN
INTRAMUSCULAR | Status: AC
  Filled 2021-06-10: qty 10

## 2021-06-10 MED ORDER — PANTOPRAZOLE SODIUM 40 MG PO TBEC
40.0000 mg | DELAYED_RELEASE_TABLET | Freq: Every day | ORAL | Status: DC
  Administered 2021-06-10 – 2021-06-12 (×3): 40 mg via ORAL
  Filled 2021-06-10 (×3): qty 1

## 2021-06-10 MED ORDER — NITROGLYCERIN 1 MG/10 ML FOR IR/CATH LAB
INTRA_ARTERIAL | Status: AC
  Filled 2021-06-10: qty 10

## 2021-06-10 MED ORDER — SODIUM CHLORIDE 0.9 % WEIGHT BASED INFUSION
1.0000 mL/kg/h | INTRAVENOUS | Status: DC
  Administered 2021-06-10: 1 mL/kg/h via INTRAVENOUS

## 2021-06-10 MED ORDER — IOHEXOL 350 MG/ML SOLN
INTRAVENOUS | Status: DC | PRN
  Administered 2021-06-10: 60 mL

## 2021-06-10 MED ORDER — ASPIRIN 81 MG PO CHEW
324.0000 mg | CHEWABLE_TABLET | Freq: Once | ORAL | Status: AC
  Administered 2021-06-10: 324 mg via ORAL
  Filled 2021-06-10: qty 4

## 2021-06-10 MED ORDER — HEPARIN SODIUM (PORCINE) 1000 UNIT/ML IJ SOLN
2000.0000 [IU] | Freq: Once | INTRAMUSCULAR | Status: AC
  Administered 2021-06-10: 2000 [IU] via INTRAVENOUS

## 2021-06-10 MED ORDER — MORPHINE SULFATE (PF) 2 MG/ML IV SOLN
INTRAVENOUS | Status: AC
  Administered 2021-06-10: 2 mg via INTRAVENOUS
  Filled 2021-06-10: qty 1

## 2021-06-10 MED ORDER — HEPARIN BOLUS VIA INFUSION: 2000.0000 [IU] | Freq: Once | INTRAVENOUS | Status: DC

## 2021-06-10 MED ORDER — LABETALOL HCL 5 MG/ML IV SOLN: 10.0000 mg | INTRAVENOUS | Status: AC | PRN

## 2021-06-10 MED ORDER — AMLODIPINE BESYLATE 5 MG PO TABS
5.0000 mg | ORAL_TABLET | Freq: Every day | ORAL | Status: DC
Start: 2021-06-10 — End: 2021-06-12
  Administered 2021-06-10 – 2021-06-12 (×3): 5 mg via ORAL
  Filled 2021-06-10 (×3): qty 1

## 2021-06-10 MED ORDER — SODIUM CHLORIDE 0.9% FLUSH
3.0000 mL | Freq: Two times a day (BID) | INTRAVENOUS | Status: DC
  Administered 2021-06-11: 3 mL via INTRAVENOUS

## 2021-06-10 MED ORDER — NITROGLYCERIN IN D5W 200-5 MCG/ML-% IV SOLN
5.0000 ug/min | INTRAVENOUS | Status: DC
  Administered 2021-06-10: 5 ug/min via INTRAVENOUS
  Filled 2021-06-10: qty 250

## 2021-06-10 SURGICAL SUPPLY — 11 items

## 2021-06-10 NOTE — H&P (Addendum)
Cardiology H&P:   Patient ID: Hector Lowe MRN: [id number]; DOB: 02-12-1922  Admit date: 06/10/2021 Date of Consult: 06/10/2021  PCP:  Caryl Bis, MD   [facility] Providers Cardiologist: NEW, Dr. Gwenlyn Found   Patient Profile:   Hector Lowe is a 88 y.o. male with a hx of HTN who is being seen 06/10/2021 for the evaluation of chest pain at the request of Dr. Roxanne Mins.  History of Present Illness:   Hector Lowe is a 88 yo male with PMH notable for HTN. Has never been seen by cardiology in the past. Follows with PCP for his hypertension. Does not smoke, use chew tobacco. He currently lives at home independently. Family checks in with him. Has been in his usual state of health up until last evening. Reports he went to bed around 11:30pm and woke up around midnight with severe chest pain. Actually started in lower abd and radiated up into the chest. Got up, took pepto-bismol without any relief. Chest pain lingered for about 30 minutes and he called 911. He was taken to [facility]. Given 324 ASA and SL nitro which resolved his pain initially.   In the ED his labs showed Na+ 136, K+ 4.5, Cr 1.48, hsTn 132>>1704, WBC 7.9, Hgb 13.6. EKG initially showed SR 78 bpm, slight ST depression in inferolateral leads, slight elevation in aVR. Developed recurrent chest pain with repeat EKG showing continued ST depression in inferolateral leads, but slight elevation in v2. He was placed on IV nitro without resolution of symptoms, along with IV heparin. CT abd with small hiatal hernia, suspicious for distal esophagitis. Case was discussed with overnight cardiology fellow with recommendations to bring to [facility] for further evaluation.   On arrival/interview he is chest pain free, still with mild abd pain. Remains on IV heparin and nitro.    Past Medical History:  Diagnosis Date   Acid reflux    Hypertension     Past Surgical History:  Procedure Laterality Date   EYE SURGERY     HERNIA REPAIR       Home  Medications:  Prior to Admission medications   Medication Sig Start Date End Date Taking? Authorizing Provider  amLODipine (NORVASC) 5 MG tablet Take 1 tablet (5 mg total) by mouth daily. 03/12/20  Yes Nita Sells, MD  linaclotide Rolan Lipa) 290 MCG CAPS capsule Take 1 capsule (290 mcg total) by mouth daily before breakfast. 10/11/20  Yes Mahala Menghini, PA-C  Multiple Vitamins-Minerals (PRESERVISION AREDS PO) Take 1 capsule by mouth in the morning and at bedtime.   Yes [provider]  omeprazole (PRILOSEC) 20 MG capsule Take 1 capsule (20 mg total) by mouth 2 (two) times daily before a meal. 04/30/20  Yes Mahala Menghini, PA-C  polyethylene glycol (MIRALAX / GLYCOLAX) 17 g packet Take 17 g by mouth 2 (two) times daily. Patient taking differently: Take 34 g by mouth in the morning. 2 capfuls 03/11/20  Yes Nita Sells, MD    Inpatient Medications: Scheduled Meds:  Continuous Infusions:  heparin 650 Units/hr (06/10/21 0646)   nitroGLYCERIN 5 mcg/min (06/10/21 0507)   PRN Meds:   Allergies:   No Known Allergies  Social History:   Social History   Socioeconomic History   Marital status: Widowed    Spouse name: Not on file   Number of children: Not on file   Years of education: Not on file   Highest education level: Not on file  Occupational History   Not on  file  Tobacco Use   Smoking status: Never   Smokeless tobacco: Current    Types: Chew  Vaping Use   Vaping Use: Never used  Substance and Sexual Activity   Alcohol use: No   Drug use: No   Sexual activity: Not on file  Other Topics Concern   Not on file  Social History Narrative   Not on file   Social Determinants of Health   Financial Resource Strain: Not on file  Food Insecurity: Not on file  Transportation Needs: Not on file  Physical Activity: Not on file  Stress: Not on file  Social Connections: Not on file  Intimate Partner Violence: Not on file    Family History:    Family  History  Problem Relation Age of Onset   Colon cancer Neg Hx      ROS:  Please see the history of present illness.   All other ROS reviewed and negative.     Physical Exam/Data:   Vitals:   06/10/21 0300 06/10/21 0430 06/10/21 0500 06/10/21 0630  BP: 130/75 (!) 150/88 103/62 134/70  Pulse: 63 82  68  Resp: 13 16 18 15   Temp:      TempSrc:      SpO2: 100% 99%  98%  Weight:      Height:        Intake/Output Summary (Last 24 hours) at 06/10/2021 0746 Last data filed at 06/10/2021 0314 Gross per 24 hour  Intake 411.51 ml  Output --  Net 411.51 ml   Last 3 Weights 06/10/2021 03/29/2021 09/01/2020  Weight (lbs) 120 lb 119 lb 12.8 oz 121 lb 3.2 oz  Weight (kg) 54.432 kg 54.341 kg 54.976 kg     Body mass index is 19.97 kg/m.  General:  Well nourished, well developed, in no acute distress HEENT: normal Neck: no JVD Vascular: No carotid bruits; Distal pulses 2+ bilaterally Cardiac:  normal S1, S2; RRR; no murmur  Lungs:  clear to auscultation bilaterally, no wheezing, rhonchi or rales  Abd: soft, nontender, no hepatomegaly  Ext: no edema Musculoskeletal:  No deformities, BUE and BLE strength normal and equal Skin: warm and dry  Neuro:  CNs 2-12 intact, no focal abnormalities noted Psych:  Normal affect   EKG:  The EKG was personally reviewed and demonstrates:  EKG initially showed SR 78 bpm, slight ST depression in inferolateral leads, slight elevation in aVR.  Repeat EKG showing continued ST depression in inferolateral leads, but slight elevation in v2  Relevant CV Studies:  N/a   Laboratory Data:  High Sensitivity Troponin:   Recent Labs  Lab 06/10/21 0223 06/10/21 0413  TROPONINIHS 132* 1,704*     Chemistry Recent Labs  Lab 06/10/21 0223  NA 136  K 4.5  CL 103  CO2 22  GLUCOSE 118*  BUN 19  CREATININE 1.48*  CALCIUM 8.9  GFRNONAA 43*  ANIONGAP 11    Recent Labs  Lab 06/10/21 0223  PROT 7.4  ALBUMIN 4.0  AST 26  ALT 17  ALKPHOS 141*  BILITOT  1.0   Lipids No results for input(s): CHOL, TRIG, HDL, LABVLDL, LDLCALC, CHOLHDL in the last 168 hours.  Hematology Recent Labs  Lab 06/10/21 0223  WBC 7.9  RBC 4.44  HGB 13.6  HCT 41.4  MCV 93.2  MCH 30.6  MCHC 32.9  RDW 13.0  PLT 219   Thyroid No results for input(s): TSH, FREET4 in the last 168 hours.  BNPNo results for input(s): BNP, PROBNP  in the last 168 hours.  DDimer No results for input(s): DDIMER in the last 168 hours.   Radiology/Studies:  CT ABDOMEN PELVIS W CONTRAST  Result Date: 06/10/2021 CLINICAL DATA:  Abdominal pain. EXAM: CT ABDOMEN AND PELVIS WITH CONTRAST TECHNIQUE: Multidetector CT imaging of the abdomen and pelvis was performed using the standard protocol following bolus administration of intravenous contrast. RADIATION DOSE REDUCTION: This exam was performed according to the departmental dose-optimization program which includes automated exposure control, adjustment of the mA and/or kV according to patient size and/or use of iterative reconstruction technique. CONTRAST:  19mL OMNIPAQUE IOHEXOL 300 MG/ML  SOLN COMPARISON:  March 07, 2020 FINDINGS: Lower chest: No acute abnormality. Hepatobiliary: No focal liver abnormality is seen. No gallstones, gallbladder wall thickening, or biliary dilatation. Pancreas: Unremarkable. No pancreatic ductal dilatation or surrounding inflammatory changes. Spleen: Normal in size without focal abnormality. Adrenals/Urinary Tract: Adrenal glands are unremarkable. Kidneys are normal, without renal calculi, focal lesion, or hydronephrosis. A large, approximately 4.0 cm x 2.1 cm, stable posterior urinary bladder wall diverticulum is seen. Stomach/Bowel: There is a small hiatal hernia with mild to moderate severity diffuse thickening of the distal esophageal wall. This is stable in appearance when compared to the prior study. Appendix appears normal. No evidence of bowel wall thickening, distention, or inflammatory changes.  Vascular/Lymphatic: Aortic atherosclerosis. No enlarged abdominal or pelvic lymph nodes. Reproductive: The prostate gland is moderately enlarged. Other: No abdominal wall hernia or abnormality. No abdominopelvic ascites. Musculoskeletal: Multilevel degenerative changes seen throughout the lumbar spine. IMPRESSION: 1. Small hiatal hernia with findings suspicious for distal esophagitis. Sequelae associated with an underlying neoplastic process cannot be excluded. 2. Stable posterior urinary bladder wall diverticulum. 3. Enlarged prostate gland. 4. Aortic atherosclerosis. Aortic Atherosclerosis (ICD10-I70.0). Electronically Signed   By: Virgina Norfolk M.D.   On: 06/10/2021 03:50     Assessment and Plan:   CAYTON DAFOE is a 88 y.o. male with a hx of HTN who is being seen 06/10/2021 for the evaluation of chest pain at the request of Dr. Roxanne Mins.  NSTEMI: developed sudden onset of abd/chest pain which woke him from sleep around midnight. No relief with pepto-bismol. hsTn 132>>1704. EKG with ST depression in inferolateral leads which is new from prior tracings. Currently on IV heparin and nitro with chest pain resolved.  -- discussed options, he would like to proceed with cardiac cath. Given his functional state this seems appropriate.  -- continue IV heparin, nitro. Add ASA, statin, low dose metoprolol 12.5mg  BID -- check lipids, Hgb A1c -- check echo  Shared Decision Making/Informed Consent{  The risks [stroke (1 in 1000), death (1 in 1000), kidney failure [usually temporary] (1 in 500), bleeding (1 in 200), allergic reaction [possibly serious] (1 in 200)], benefits (diagnostic support and management of coronary artery disease) and alternatives of a cardiac catheterization were discussed in detail with Hector Lowe and he is willing to proceed.  HTN: on norvasc PTA (hold) -- start metoprolol 12.5mg  BID  GERD: add Protonix   CKD stage III: baseline Cr appears around 1.2-1.4   Risk Assessment/Risk  Scores:     TIMI Risk Score for Unstable Angina or Non-ST Elevation MI:   The patient's TIMI risk score is 4, which indicates a 20% risk of all cause mortality, new or recurrent myocardial infarction or need for urgent revascularization in the next 14 days.{   For questions or updates, please contact [place] Please consult [email or web address] for contact info under    Signed, Reino Bellis,  NP  06/10/2021 7:46 AM    Agree with note by Reino Bellis NP-C  HectorLowe was admitted in transfer because of non-STEMI.  He is 88 years old with minimal risk factors.  He had chest pain last night which was relieved with nitroglycerin and then recurred and finally resolved with IV nitroglycerin.  His EKG shows minimal ST segment depression and his troponins were positive.  His exam is benign.  We had a long discussion with the patient and his family regarding the risks and benefits of cardiac catheterization at his age.  He lives independently with family that comes in on a daily basis.  The patient and his family accept the risks of heart catheterization, intervention.  I do not think he would be a candidate for CABG if his anatomy with more suitable for that.  Lorretta Harp, M.D., [place], [facility], [place] [address] [place], [place]  [address]  [phone] 06/10/2021 9:15 AM

## 2021-06-10 NOTE — ED Notes (Signed)
Pt ambulated to the bathroom with minimal assist and back to his room.

## 2021-06-10 NOTE — ED Notes (Signed)
[facility] here to pick up pt.  

## 2021-06-10 NOTE — ED Notes (Signed)
Pt ambulated to the bathroom with minimal assist and then taken to CT via wheelchair

## 2021-06-10 NOTE — Interval H&P Note (Signed)
Cath Lab Visit (complete for each Cath Lab visit)  Clinical Evaluation Leading to the Procedure:   ACS: Yes.    Non-ACS:    Anginal Classification: CCS II  Anti-ischemic medical therapy: Minimal Therapy (1 class of medications)  Non-Invasive Test Results: No non-invasive testing performed  Prior CABG: No previous CABG      History and Physical Interval Note:  06/10/2021 1:20 PM  Hector Lowe  has presented today for surgery, with the diagnosis of chest pain.  The various methods of treatment have been discussed with the patient and family. After consideration of risks, benefits and other options for treatment, the patient has consented to  Procedure(s): LEFT HEART CATH AND CORONARY ANGIOGRAPHY (N/A) as a surgical intervention.  The patient's history has been reviewed, patient examined, no change in status, stable for surgery.  I have reviewed the patient's chart and labs.  Questions were answered to the patient's satisfaction.     Quay Burow

## 2021-06-10 NOTE — ED Provider Notes (Signed)
Patient arrived from [facility], had complaint of abdominal pain and chest pain but is now pain-free.  He arrived on a nitroglycerin drip.  CT scan showed no acute intra-abdominal process.  Troponin was markedly elevated, patient transferred as a non-STEMI.  He is currently hemodynamically stable.  Cardiology will be consulted.  Mardella Layman, cardiology PA, agrees to come and evaluate the patient, and to decide whether to admit under cardiology service or hospitalist service.   Dione Booze, MD 06/10/21 0630

## 2021-06-10 NOTE — Progress Notes (Signed)
ANTICOAGULATION CONSULT NOTE - Initial Consult  Pharmacy Consult for heparin Indication: chest pain/ACS  No Known Allergies  Patient Measurements: Height: 5\' 5"  (165.1 cm) Weight: 54.4 kg (120 lb) IBW/kg (Calculated) : 61.5  Vital Signs: Temp: 97.4 F (36.3 C) (01/20 0208) Temp Source: Axillary (01/20 0208) BP: 150/88 (01/20 0430) Pulse Rate: 82 (01/20 0430)  Labs: Recent Labs    06/10/21 0223 06/10/21 0413  HGB 13.6  --   HCT 41.4  --   PLT 219  --   CREATININE 1.48*  --   TROPONINIHS 132* 1,704*    Estimated Creatinine Clearance: 22 mL/min (A) (by C-G formula based on SCr of 1.48 mg/dL (H)).   Medical History: Past Medical History:  Diagnosis Date   Acid reflux    Hypertension     Assessment: 88yo male c/o abdominal pain, no relief with Pepto-Bismol, initial troponin mildly elevated and now rising 88yo), to begin heparin.  Goal of Therapy:  Heparin level 0.3-0.7 units/ml Monitor platelets by anticoagulation protocol: Yes   Plan:  Heparin 2000 units IV bolus x1 followed by infusion at 650 units/hr and monitor heparin levels and CBC.  [phone], PharmD, BCPS  06/10/2021,5:06 AM

## 2021-06-10 NOTE — ED Notes (Signed)
Patient transported to CT 

## 2021-06-10 NOTE — ED Notes (Signed)
Family remains at bedside. Patient is resting no complaints.

## 2021-06-10 NOTE — ED Provider Notes (Signed)
[facility] EMERGENCY DEPARTMENT Provider Note   CSN: [id number] Arrival date & time: 06/10/21  0145     History  Chief Complaint  Patient presents with   Abdominal Pain    Hector Lowe is a 88 y.o. male.  Patient is a 88 year old male with past medical history of hypertension, chronic renal insufficiency, GERD.  Patient presenting today with complaints of generalized abdominal pain.  This started earlier this evening in the absence of any injury or trauma.  He describes a "burning" throughout his abdomen.  He denies any fevers or chills.  He denies diarrhea.  He does report chronic constipation and difficulty moving his bowels, but denies any bloody or melanotic stool.  He denies any ill contacts.  The history is provided by the patient.  Abdominal Pain Pain location:  Generalized Pain quality: burning   Pain radiates to:  Does not radiate Pain severity:  Moderate Onset quality:  Sudden Timing:  Constant Progression:  Partially resolved Chronicity:  New Relieved by:  Nothing Worsened by:  Nothing     Home Medications Prior to Admission medications   Medication Sig Start Date End Date Taking? Authorizing Provider  amLODipine (NORVASC) 5 MG tablet Take 1 tablet (5 mg total) by mouth daily. 03/12/20   Rhetta Mura, MD  colchicine 0.6 MG tablet Take 0.5 tablets (0.3 mg total) by mouth daily. Patient taking differently: Take 0.3 mg by mouth daily. As needed 03/12/20   Rhetta Mura, MD  Docusate Calcium (STOOL SOFTENER PO) Take by mouth as needed.    [provider]  linaclotide Karlene Einstein) 290 MCG CAPS capsule Take 1 capsule (290 mcg total) by mouth daily before breakfast. 10/11/20   Tiffany Kocher, PA-C  METAMUCIL FIBER PO Take by mouth. Every night    [provider]  Multiple Vitamins-Minerals (PRESERVISION AREDS PO) Take by mouth daily.    [provider]  omeprazole (PRILOSEC) 20 MG capsule Take 1 capsule (20 mg total) by mouth 2  (two) times daily before a meal. 04/30/20   Tiffany Kocher, PA-C  polyethylene glycol (MIRALAX / GLYCOLAX) 17 g packet Take 17 g by mouth 2 (two) times daily. Patient taking differently: Take by mouth in the morning. 2 capfuls 03/11/20   Rhetta Mura, MD      Allergies    Patient has no known allergies.    Review of Systems   Review of Systems  Gastrointestinal:  Positive for abdominal pain.  All other systems reviewed and are negative.  Physical Exam Updated Vital Signs BP (!) 161/83 (BP Location: Left Arm)    Pulse 65    Temp (!) 97.4 F (36.3 C) (Axillary)    Resp 16    Ht 5\' 5"  (1.651 m)    Wt 54.4 kg    SpO2 100%    BMI 19.97 kg/m  Physical Exam Vitals and nursing note reviewed.  Constitutional:      General: He is not in acute distress.    Appearance: He is well-developed. He is not diaphoretic.  HENT:     Head: Normocephalic and atraumatic.  Cardiovascular:     Rate and Rhythm: Normal rate and regular rhythm.     Heart sounds: No murmur heard.   No friction rub.  Pulmonary:     Effort: Pulmonary effort is normal. No respiratory distress.     Breath sounds: Normal breath sounds. No wheezing or rales.  Abdominal:     General: Bowel sounds are normal. There is no  distension.     Palpations: Abdomen is soft.     Tenderness: There is abdominal tenderness in the right lower quadrant, suprapubic area and left lower quadrant. There is no right CVA tenderness, left CVA tenderness, guarding or rebound.  Musculoskeletal:        General: Normal range of motion.     Cervical back: Normal range of motion and neck supple.  Skin:    General: Skin is warm and dry.  Neurological:     Mental Status: He is alert and oriented to person, place, and time.     Coordination: Coordination normal.    ED Results / Procedures / Treatments   Labs (all labs ordered are listed, but only abnormal results are displayed) Labs Reviewed  LIPASE, BLOOD  COMPREHENSIVE METABOLIC PANEL   CBC  URINALYSIS, ROUTINE W REFLEX MICROSCOPIC    EKG EKG Interpretation  Date/Time:  Friday June 10 2021 02:09:33 EST Ventricular Rate:  78 PR Interval:  200 QRS Duration: 92 QT Interval:  379 QTC Calculation: 432 R Axis:   -24 Text Interpretation: Sinus rhythm Supraventricular bigeminy Borderline left axis deviation Confirmed by Geoffery Lyons ([id number]) on 06/10/2021 2:17:55 AM  Radiology No results found.  Procedures Procedures  Continuous cardiac monitoring  Medications Ordered in ED Medications  sodium chloride 0.9 % bolus 500 mL (has no administration in time range)    ED Course/ Medical Decision Making/ A&P  This patient presents to the ED for concern of chest and abdominal pain, this involves an extensive number of treatment options, and is a complaint that carries with it a high risk of complications and morbidity.  The differential diagnosis includes acute coronary syndrome, esophagitis, gastritis, small bowel obstruction, aortic dissection   Co morbidities that complicate the patient evaluation  None   Additional history obtained:  Additional history obtained from daughter-in-law and son who are present at bedside No external records necessary   Lab Tests:  I Ordered, and personally interpreted labs.  The pertinent results include: CBC, basic metabolic panel, troponin.  Initial troponin returned elevated at 132, then was later repeated at 1700   Imaging Studies ordered:  I ordered imaging studies including chest x-ray and CT scan of the abdomen and pelvis I independently visualized and interpreted imaging which showed no acute intra-abdominal process and chest x-ray essentially unremarkable I agree with the radiologist interpretation   Cardiac Monitoring:  The patient was maintained on a cardiac monitor.  I personally viewed and interpreted the cardiac monitored which showed an underlying rhythm of: Sinus rhythm   Medicines ordered and  prescription drug management:  I ordered medication including nitroglycerin, aspirin, heparin, morphine, oxygen for acute coronary syndrome Reevaluation of the patient after these medicines showed that the patient worsened I have reviewed the patients home medicines and have made adjustments as needed   Test Considered:  No other test considered   Critical Interventions:  Anticoagulation and nitroglycerin drip   Consultations Obtained:  I requested consultation with the cardiologist, Dr. Lendell Caprice,  and discussed lab and imaging findings as well as pertinent plan - they recommend: Transfer to [facility] for catheterization   Problem List / ED Course:  Patient is a 88 year old male presenting with complaints of chest and abdominal pain.  This started earlier this evening.  By the time patient arrived here, his chest pain was gone.  He had tenderness to the lower abdomen so I obtained work-up into this.  CT scan showed no acute process, but he did return  with a troponin of 132.  He remained pain-free until just after his second troponin had been drawn.  At this time, he began to complain of 10 out of 10 pain, then the troponin returned at 1700.  EKG was repeated showing no acute ST segment changes. I spoke with Dr. Lendell CapriceSullivan from cardiology to discuss the case.  He did not feel as though age prohibited a heart cath in this situation.  I spoke with the patient's son and daughter-in-law and both are in agreement that patient be transferred to [facility] for possible catheterization.  Patient also in agreement with this.  CODE STATUS was discussed with family and patient and patient request to be a full code. Patient was transferred by EMS ER to ER.  Dr. Blinda LeatherwoodPollina made aware of patient and agrees to accept in transfer to the [facility], ER.   Reevaluation:  After the interventions noted above, I reevaluated the patient and found that they have :worsened   Social Determinants of  Health:  None   Dispostion:  After consideration of the diagnostic results and the patients response to treatment, I feel that the patent would benefit from transfer to [facility] for cardiology consultation/possible cardiac cath.  CRITICAL CARE Performed by: Geoffery Lyonsouglas Sueo Cullen Total critical care time: 70 minutes Critical care time was exclusive of separately billable procedures and treating other patients. Critical care was necessary to treat or prevent imminent or life-threatening deterioration. Critical care was time spent personally by me on the following activities: development of treatment plan with patient and/or surrogate as well as nursing, discussions with consultants, evaluation of patient's response to treatment, examination of patient, obtaining history from patient or surrogate, ordering and performing treatments and interventions, ordering and review of laboratory studies, ordering and review of radiographic studies, pulse oximetry and re-evaluation of patient's condition.     Final Clinical Impression(s) / ED Diagnoses Final diagnoses:  None    Rx / DC Orders ED Discharge Orders     None         Geoffery Lyonselo, Lecia Esperanza, MD 06/10/21 [phone]

## 2021-06-10 NOTE — ED Notes (Signed)
Cards-Stemi paged to Dr Judd Lien

## 2021-06-10 NOTE — Progress Notes (Signed)
°  Echocardiogram 2D Echocardiogram has been performed.  Hector Lowe 06/10/2021, 11:38 AM

## 2021-06-10 NOTE — ED Triage Notes (Signed)
Pt from home via [facility]; lives alone. C/o abdominal pain since midnight. Burning pain from groin to throat. Pt denies n/v/d/ fever  Pt took pepto around midnight with no relief.    EMS: 20 G [facility]

## 2021-06-10 NOTE — ED Notes (Signed)
Pt back from CT

## 2021-06-10 NOTE — Progress Notes (Signed)
°  Transition of Care [facility]) Screening Note   Patient Details  Name: Hector Lowe Date of Birth: 01-19-1922   Transition of Care [facility]) CM/SW Contact:    Delilah Shan, LCSWA Phone Number: 06/10/2021, 4:10 PM    Transition of Care Department [facility]) has reviewed patient and no TOC needs have been identified at this time. We will continue to monitor patient advancement through interdisciplinary progression rounds. If new patient transition needs arise, please place a TOC consult.

## 2021-06-11 LAB — CBC
HCT: 35.1 % — ABNORMAL LOW (ref 39.0–52.0)
Hemoglobin: 11.5 g/dL — ABNORMAL LOW (ref 13.0–17.0)
MCH: 29.7 pg (ref 26.0–34.0)
MCHC: 32.8 g/dL (ref 30.0–36.0)
MCV: 90.7 fL (ref 80.0–100.0)
Platelets: 195 10*3/uL (ref 150–400)
RBC: 3.87 MIL/uL — ABNORMAL LOW (ref 4.22–5.81)
RDW: 13.4 % (ref 11.5–15.5)
WBC: 7.4 10*3/uL (ref 4.0–10.5)
nRBC: 0 % (ref 0.0–0.2)

## 2021-06-11 LAB — LIPID PANEL
Cholesterol: 149 mg/dL (ref 0–200)
HDL: 44 mg/dL (ref >40)
LDL Cholesterol: 97 mg/dL (ref 0–99)
Total CHOL/HDL Ratio: 3.4 RATIO
Triglycerides: 42 mg/dL (ref <150)
VLDL: 8 mg/dL (ref 0–40)

## 2021-06-11 LAB — BASIC METABOLIC PANEL
Anion gap: 8 (ref 5–15)
BUN: 17 mg/dL (ref 8–23)
CO2: 21 mmol/L — ABNORMAL LOW (ref 22–32)
Calcium: 8.3 mg/dL — ABNORMAL LOW (ref 8.9–10.3)
Chloride: 108 mmol/L (ref 98–111)
Creatinine, Ser: 1.53 mg/dL — ABNORMAL HIGH (ref 0.61–1.24)
GFR, Estimated: 41 mL/min — ABNORMAL LOW (ref >60)
Glucose, Bld: 93 mg/dL (ref 70–99)
Potassium: 4.1 mmol/L (ref 3.5–5.1)
Sodium: 137 mmol/L (ref 135–145)

## 2021-06-11 LAB — HEPARIN LEVEL (UNFRACTIONATED)
Heparin Unfractionated: <0.1 IU/mL — ABNORMAL LOW (ref 0.30–0.70)
Heparin Unfractionated: 0.33 IU/mL (ref 0.30–0.70)
Heparin Unfractionated: 0.57 IU/mL (ref 0.30–0.70)

## 2021-06-11 MED ORDER — HEPARIN BOLUS VIA INFUSION
1000.0000 [IU] | Freq: Once | INTRAVENOUS | Status: AC
  Administered 2021-06-11: 1000 [IU] via INTRAVENOUS
  Filled 2021-06-11: qty 1000

## 2021-06-11 NOTE — Plan of Care (Signed)
°  Problem: Education: Goal: Knowledge of General Education information will improve Description: Including pain rating scale, medication(s)/side effects and non-pharmacologic comfort measures Outcome: Progressing   Problem: Clinical Measurements: Goal: Ability to maintain clinical measurements within normal limits will improve Outcome: Progressing   Problem: Clinical Measurements: Goal: Cardiovascular complication will be avoided Outcome: Progressing   Problem: Pain Managment: Goal: General experience of comfort will improve Outcome: Progressing   Problem: Cardiovascular: Goal: Ability to achieve and maintain adequate cardiovascular perfusion will improve Outcome: Progressing   Problem: Cardiovascular: Goal: Vascular access site(s) Level 0-1 will be maintained Outcome: Progressing

## 2021-06-11 NOTE — Progress Notes (Signed)
ANTICOAGULATION CONSULT NOTE  Pharmacy Consult for heparin Indication: chest pain/ACS  No Known Allergies  Patient Measurements: Height: 5\' 5"  (165.1 cm) Weight: 54.4 kg (120 lb) IBW/kg (Calculated) : 61.5  Vital Signs: Temp: 97.8 F (36.6 C) (01/21 0028) Temp Source: Oral (01/21 0028) BP: 124/72 (01/21 0028) Pulse Rate: 62 (01/21 0028)  Labs: Recent Labs    06/10/21 0223 06/10/21 0413 06/11/21 0220  HGB 13.6  --  11.5*  HCT 41.4  --  35.1*  PLT 219  --  195  HEPARINUNFRC  --   --  <0.10*  CREATININE 1.48*  --  1.53*  TROPONINIHS 132* 1,704*  --      Estimated Creatinine Clearance: 21.2 mL/min (A) (by C-G formula based on SCr of 1.53 mg/dL (H)).   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 Heparin was resumed 4 hours post sheath removal.   Currently on IV heparin at 650 units/hr and heparin level is undetectable. H/H down. Plt wnl. RN reports no s/s of bleeding   Goal of Therapy:  Heparin level 0.3-0.7 units/ml Monitor platelets by anticoagulation protocol: Yes   Plan:  -Heparin 1000 units IV bolus, then increase heparin infusion to 800 units/hr  -F/u 8 hr HL -Monitor for bleeding.   88yo, PharmD., BCPS, BCCCP Clinical Pharmacist Please refer to [facility] for unit-specific pharmacist

## 2021-06-11 NOTE — Progress Notes (Signed)
Subjective:  No chest pain   Objective:  Vitals:   06/10/21 2208 06/11/21 0028 06/11/21 0413 06/11/21 0807  BP: 124/64 124/72 (!) 114/50 132/66  Pulse: 65 62 60 60  Resp: 14 18 18 18   Temp:  97.8 F (36.6 C) 98.6 F (37 C) 97.9 F (36.6 C)  TempSrc:  Oral Oral Oral  SpO2: 98% 97% 97%   Weight:      Height:        Intake/Output from previous day:  Intake/Output Summary (Last 24 hours) at 06/11/2021 0946 Last data filed at 06/10/2021 1718 Gross per 24 hour  Intake 524.59 ml  Output 140 ml  Net 384.59 ml    Physical Exam: Affect appropriate Elderly male  HEENT: normal Neck supple with no adenopathy JVP normal no bruits no thyromegaly Lungs clear with no wheezing and good diaphragmatic motion Heart:  S1/S2 no murmur, no rub, gallop or click PMI normal Abdomen: benighn, BS positve, no tenderness, no AAA no bruit.  No HSM or HJR Distal pulses intact with no bruits No edema Neuro non-focal Skin warm and dry No muscular weakness   Lab Results: Basic Metabolic Panel: Recent Labs    06/10/21 0223 06/11/21 0220  NA 136 137  K 4.5 4.1  CL 103 108  CO2 22 21*  GLUCOSE 118* 93  BUN 19 17  CREATININE 1.48* 1.53*  CALCIUM 8.9 8.3*   Liver Function Tests: Recent Labs    06/10/21 0223  AST 26  ALT 17  ALKPHOS 141*  BILITOT 1.0  PROT 7.4  ALBUMIN 4.0   Recent Labs    06/10/21 0223  LIPASE 47   CBC: Recent Labs    06/10/21 0223 06/11/21 0220  WBC 7.9 7.4  HGB 13.6 11.5*  HCT 41.4 35.1*  MCV 93.2 90.7  PLT 219 195   Cardiac Enzymes: No results for input(s): CKTOTAL, CKMB, CKMBINDEX, TROPONINI in the last 72 hours. BNP: Invalid input(s): POCBNP D-Dimer: No results for input(s): DDIMER in the last 72 hours. Hemoglobin A1C: Recent Labs    06/10/21 1545  HGBA1C 5.5   Fasting Lipid Panel: Recent Labs    06/11/21 0220  CHOL 149  HDL 44  LDLCALC 97  TRIG 42  CHOLHDL 3.4     Imaging: CT ABDOMEN PELVIS W CONTRAST  Result Date:  06/10/2021 CLINICAL DATA:  Abdominal pain. EXAM: CT ABDOMEN AND PELVIS WITH CONTRAST TECHNIQUE: Multidetector CT imaging of the abdomen and pelvis was performed using the standard protocol following bolus administration of intravenous contrast. RADIATION DOSE REDUCTION: This exam was performed according to the departmental dose-optimization program which includes automated exposure control, adjustment of the mA and/or kV according to patient size and/or use of iterative reconstruction technique. CONTRAST:  130mL OMNIPAQUE IOHEXOL 300 MG/ML  SOLN COMPARISON:  March 07, 2020 FINDINGS: Lower chest: No acute abnormality. Hepatobiliary: No focal liver abnormality is seen. No gallstones, gallbladder wall thickening, or biliary dilatation. Pancreas: Unremarkable. No pancreatic ductal dilatation or surrounding inflammatory changes. Spleen: Normal in size without focal abnormality. Adrenals/Urinary Tract: Adrenal glands are unremarkable. Kidneys are normal, without renal calculi, focal lesion, or hydronephrosis. A large, approximately 4.0 cm x 2.1 cm, stable posterior urinary bladder wall diverticulum is seen. Stomach/Bowel: There is a small hiatal hernia with mild to moderate severity diffuse thickening of the distal esophageal wall. This is stable in appearance when compared to the prior study. Appendix appears normal. No evidence of bowel wall thickening, distention, or inflammatory changes. Vascular/Lymphatic: Aortic atherosclerosis. No  enlarged abdominal or pelvic lymph nodes. Reproductive: The prostate gland is moderately enlarged. Other: No abdominal wall hernia or abnormality. No abdominopelvic ascites. Musculoskeletal: Multilevel degenerative changes seen throughout the lumbar spine. IMPRESSION: 1. Small hiatal hernia with findings suspicious for distal esophagitis. Sequelae associated with an underlying neoplastic process cannot be excluded. 2. Stable posterior urinary bladder wall diverticulum. 3. Enlarged  prostate gland. 4. Aortic atherosclerosis. Aortic Atherosclerosis (ICD10-I70.0). Electronically Signed   By: Virgina Norfolk M.D.   On: 06/10/2021 03:50   CARDIAC CATHETERIZATION  Addendum Date: 06/10/2021     Ost LM lesion is 80% stenosed.   1st Diag lesion is 95% stenosed.   Ost RCA lesion is 50% stenosed.   RPDA lesion is 50% stenosed. Hector Lowe is a 88 y.o. male  [id number] LOCATION:  FACILITY: [facility] PHYSICIAN: Quay Burow, M.D. 1922/04/15 DATE OF PROCEDURE:  06/10/2021 DATE OF DISCHARGE: CARDIAC CATHETERIZATION History obtained from chart review. Hector Lowe is a 88 year old single Caucasian male with history of hypertension who presented with unstable angina and positive enzymes consistent with non-STEMI.  His EKG showed no acute changes.  2D echo was essentially normal.  He presents now for diagnostic coronary angiography. IMPRESSION:Hector Lowe has high-grade ostial/proximal left main disease, high-grade proximal first diagonal branch disease and moderate ostial large dominant RCA disease that does not look significant.  He is not a CABG candidate.  At this point, short of performing left main intervention, I recommend antianginal therapy.  The sheath was removed and a TR band was placed on the right wrist to achieve patent hemostasis.  The patient left the lab in stable condition.  I am going to start IV heparin 4 hours after sheath removal and continue heparin for 48 hours.  We will begin him on dual antiplatelet therapy.  I had a long discussion with the patient's son and daughter-in-law who agreed with the plan of medical therapy alone.  Suspect he will be ready for discharge either Sunday or Monday. Quay Burow. MD, [facility] 06/10/2021 2:03 PM    Result Date: 06/10/2021 Images from the original result were not included.   Ost LM lesion is 80% stenosed.   1st Diag lesion is 95% stenosed.   Ost RCA lesion is 50% stenosed.   RPDA lesion is 50% stenosed. Hector Lowe is a 88 y.o. male  [id number]  LOCATION:  FACILITY: [place] PHYSICIAN: Quay Burow, M.D. [birth year] DATE OF PROCEDURE:  06/10/2021 DATE OF DISCHARGE: CARDIAC CATHETERIZATION History obtained from chart review. Hector Lowe is a 88 year old single Caucasian male with history of hypertension who presented with unstable angina and positive enzymes consistent with non-STEMI.  His EKG showed no acute changes.  2D echo was essentially normal.  He presents now for diagnostic coronary angiography. IMPRESSION:Hector Lowe has high-grade ostial/proximal left main disease, high-grade proximal first diagonal branch disease and moderate ostial large dominant RCA disease that does not look significant.  He is not a CABG candidate.  At this point, short of performing left main intervention, I recommend antianginal therapy.  The sheath was removed and a TR band was placed on the right wrist to achieve patent hemostasis.  The patient left the lab in stable condition.  I am going to start IV heparin 4 hours after sheath removal pending determination of final treatment strategy. Quay Burow. MD, [facility] 06/10/2021 2:03 PM    ECHOCARDIOGRAM COMPLETE  Result Date: 06/10/2021    ECHOCARDIOGRAM REPORT   Patient Name:   [facility] Date of Exam: 06/10/2021 Medical  Rec #:  [id number]      Height:       65.0 in Accession #:    [id number]     Weight:       120.0 lb Date of Birth:  14-Oct-1921     BSA:          1.592 m Patient Age:    4 years       BP:           126/67 mmHg Patient Gender: M              HR:           72 bpm. Exam Location:  Inpatient Procedure: 2D Echo, Cardiac Doppler and Color Doppler Indications:    R07.9* Chest pain, unspecified  History:        Patient has no prior history of Echocardiogram examinations.                 Risk Factors:Hypertension.  Sonographer:    Bernadene Person RDCS Referring Phys: [phone] [facility] B ROBERTS  Sonographer Comments: No subcostal window. IMPRESSIONS  1. Left ventricular ejection fraction, by estimation, is 60 to 65%. The left  ventricle has normal function. The left ventricle has no regional wall motion abnormalities. Left ventricular diastolic parameters are consistent with Grade I diastolic dysfunction (impaired relaxation).  2. No subcostal views to visualize IVC for RVSP. Right ventricular systolic function is normal. The right ventricular size is normal.  3. A small pericardial effusion is present. There is no evidence of cardiac tamponade.  4. The mitral valve is degenerative. Mild mitral valve regurgitation.  5. The aortic valve was not well visualized. Aortic valve regurgitation is not visualized.  6. The inferior vena cava not visualized. Comparison(s): No prior Echocardiogram. FINDINGS  Left Ventricle: Left ventricular ejection fraction, by estimation, is 60 to 65%. The left ventricle has normal function. The left ventricle has no regional wall motion abnormalities. The left ventricular internal cavity size was normal in size. There is  no left ventricular hypertrophy. Left ventricular diastolic parameters are consistent with Grade I diastolic dysfunction (impaired relaxation). Right Ventricle: No subcostal views to visualize IVC for RVSP. The right ventricular size is normal. No increase in right ventricular wall thickness. Right ventricular systolic function is normal. Left Atrium: Left atrial size was not well visualized. Right Atrium: Right atrial size was normal in size. Pericardium: A small pericardial effusion is present. There is no evidence of cardiac tamponade. Mitral Valve: The mitral valve is degenerative in appearance. Mild mitral valve regurgitation. Tricuspid Valve: The tricuspid valve is not well visualized. Tricuspid valve regurgitation is mild. Aortic Valve: The aortic valve was not well visualized. Aortic valve regurgitation is not visualized. Pulmonic Valve: The pulmonic valve was not well visualized. Pulmonic valve regurgitation is not visualized. Aorta: The aortic root and ascending aorta are structurally  normal, with no evidence of dilitation. Venous: The inferior vena cava not visualized. IAS/Shunts: No atrial level shunt detected by color flow Doppler.  LEFT VENTRICLE PLAX 2D LVIDd:         3.40 cm     Diastology LVIDs:         2.40 cm     LV e' medial:    5.25 cm/s LV PW:         0.90 cm     LV E/e' medial:  16.3 LV IVS:        0.90 cm     LV e' lateral:  6.68 cm/s LVOT diam:     2.00 cm     LV E/e' lateral: 12.8 LV SV:         48 LV SV Index:   30 LVOT Area:     3.14 cm  LV Volumes (MOD) LV vol d, MOD A2C: 46.1 ml LV vol d, MOD A4C: 70.7 ml LV vol s, MOD A2C: 19.5 ml LV vol s, MOD A4C: 33.5 ml LV SV MOD A2C:     26.6 ml LV SV MOD A4C:     70.7 ml LV SV MOD BP:      30.7 ml RIGHT VENTRICLE RV S prime:     10.50 cm/s TAPSE (M-mode): 1.8 cm LEFT ATRIUM             Index        RIGHT ATRIUM           Index LA diam:        2.40 cm 1.51 cm/m   RA Area:     16.20 cm LA Vol (A2C):   29.7 ml 18.65 ml/m  RA Volume:   39.50 ml  24.81 ml/m LA Vol (A4C):   25.6 ml 16.08 ml/m LA Biplane Vol: 29.7 ml 18.65 ml/m  AORTIC VALVE LVOT Vmax:   69.80 cm/s LVOT Vmean:  47.200 cm/s LVOT VTI:    0.154 m  AORTA Ao Root diam: 3.00 cm Ao Asc diam:  3.00 cm MITRAL VALVE               TRICUSPID VALVE MV Area (PHT): 3.53 cm    TR Peak grad:   30.0 mmHg MV Decel Time: 215 msec    TR Vmax:        274.00 cm/s MV E velocity: 85.40 cm/s MV A velocity: 95.60 cm/s  SHUNTS MV E/A ratio:  0.89        Systemic VTI:  0.15 m                            Systemic Diam: 2.00 cm Phineas Inches Electronically signed by Phineas Inches Signature Date/Time: 06/10/2021/11:35:34 AM    Final     Cardiac Studies:  ECG: SR PR 218 msec no acute ST changes    Telemetry:  NSR 06/11/2021   Echo:   Medications:    amLODipine  5 mg Oral Daily   aspirin  81 mg Oral Daily   atorvastatin  80 mg Oral q1800   influenza vaccine adjuvanted  0.5 mL Intramuscular Tomorrow-1000   isosorbide mononitrate  15 mg Oral Daily   metoprolol tartrate  12.5 mg Oral BID    pantoprazole  40 mg Oral Daily   sodium chloride flush  3 mL Intravenous Q12H   sodium chloride flush  3 mL Intravenous Q12H      sodium chloride     heparin 800 Units/hr (06/11/21 0346)   nitroGLYCERIN 10 mcg/min (06/10/21 1718)    Assessment/Plan:   SEMI:  troponin 1704 no acute ECG changes cath 06/10/21 shows 80% ostial LM and 95% D1. Given age not a candidate for CABG or high risk Impella supported intervention heparin for 48 hours Continue nitrates statin ASA and beta blocker Ambulate possible d/c Sunday or Monday Echo with EF 60-65% suggesting that Diagonal was culprit lesion   Jenkins Rouge 06/11/2021, 9:46 AM

## 2021-06-11 NOTE — Progress Notes (Signed)
ANTICOAGULATION CONSULT NOTE - Follow Up Consult  Pharmacy Consult for Heparin Indication: chest pain/ACS  No Known Allergies  Patient Measurements: Height: 5\' 5"  (165.1 cm) Weight: 54.4 kg (120 lb) IBW/kg (Calculated) : 61.5 Heparin Dosing Weight: 54.4 kg   Vital Signs: Temp: 97.9 F (36.6 C) (01/21 0807) Temp Source: Oral (01/21 0807) BP: 132/66 (01/21 0807) Pulse Rate: 60 (01/21 0807)  Labs: Recent Labs    06/10/21 0223 06/10/21 0413 06/11/21 0220 06/11/21 1124  HGB 13.6  --  11.5*  --   HCT 41.4  --  35.1*  --   PLT 219  --  195  --   HEPARINUNFRC  --   --  <0.10* 0.33  CREATININE 1.48*  --  1.53*  --   TROPONINIHS 132* 1,704*  --   --     Estimated Creatinine Clearance: 21.2 mL/min (A) (by C-G formula based on SCr of 1.53 mg/dL (H)).   Medications:  Scheduled:   amLODipine  5 mg Oral Daily   aspirin  81 mg Oral Daily   atorvastatin  80 mg Oral q1800   influenza vaccine adjuvanted  0.5 mL Intramuscular Tomorrow-1000   isosorbide mononitrate  15 mg Oral Daily   metoprolol tartrate  12.5 mg Oral BID   pantoprazole  40 mg Oral Daily   sodium chloride flush  3 mL Intravenous Q12H   sodium chloride flush  3 mL Intravenous Q12H   Infusions:   sodium chloride     heparin 800 Units/hr (06/11/21 0346)   nitroGLYCERIN 10 mcg/min (06/10/21 1718)    Assessment: 88 yo M presenting with c/o abdominal pain and rising troponins s/p LHC found to have 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 Patient was not on anticoagulation PTA. Pharmacy consulted for heparin dosing.  Heparin was resumed 4 hours post-sheath removal.   Heparin level today is therapeutic at 0.33, on 800 units/hr. Hgb 11.5, plt 195. Per RN, patient's heparin line was beeping on and off overnight, but the dressing was changed and flushed earlier this morning and no issues reported since.  No signs/symptoms of bleeding noted.  Goal of Therapy:  Heparin level  0.3-0.7 units/ml Monitor platelets by anticoagulation protocol: Yes   Plan:  Continue heparin gtt at 800 units/hr. Check ~8 hr heparin level.  Daily CBC, heparin level. Monitor for signs/symptoms of bleeding.   94, PharmD PGY1 Pharmacy Resident Phone [phone] 06/11/2021 12:37 PM   Please check AMION for all [facility] Pharmacy phone numbers After 10:00 PM, call Main Pharmacy [phone]

## 2021-06-11 NOTE — Progress Notes (Signed)
ANTICOAGULATION CONSULT NOTE - Follow Up Consult  Pharmacy Consult for Heparin Indication: chest pain/ACS  No Known Allergies  Patient Measurements: Height: 5\' 5"  (165.1 cm) Weight: 54.4 kg (120 lb) IBW/kg (Calculated) : 61.5 Heparin Dosing Weight: 54.4 kg   Vital Signs: Temp: 97.8 F (36.6 C) (01/21 1959) Temp Source: Oral (01/21 1959) BP: 128/75 (01/21 2005) Pulse Rate: 75 (01/21 2005)  Labs: Recent Labs    06/10/21 0223 06/10/21 0413 06/11/21 0220 06/11/21 1124 06/11/21 1926  HGB 13.6  --  11.5*  --   --   HCT 41.4  --  35.1*  --   --   PLT 219  --  195  --   --   HEPARINUNFRC  --   --  <0.10* 0.33 0.57  CREATININE 1.48*  --  1.53*  --   --   TROPONINIHS 132* 1,704*  --   --   --      Estimated Creatinine Clearance: 21.2 mL/min (A) (by C-G formula based on SCr of 1.53 mg/dL (H)).   Medications:  Scheduled:   amLODipine  5 mg Oral Daily   aspirin  81 mg Oral Daily   atorvastatin  80 mg Oral q1800   influenza vaccine adjuvanted  0.5 mL Intramuscular Tomorrow-1000   isosorbide mononitrate  15 mg Oral Daily   metoprolol tartrate  12.5 mg Oral BID   pantoprazole  40 mg Oral Daily   sodium chloride flush  3 mL Intravenous Q12H   sodium chloride flush  3 mL Intravenous Q12H   Infusions:   sodium chloride     heparin 800 Units/hr (06/11/21 0346)   nitroGLYCERIN 10 mcg/min (06/10/21 1718)    Assessment: 88 yo M presenting with c/o abdominal pain and rising troponins s/p LHC found to have 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 Patient was not on anticoagulation PTA. Pharmacy consulted for heparin dosing.  Heparin was resumed 4 hours post-sheath removal.   Heparin level remains therapeutic at 0.57, on 800 units/hr.   Goal of Therapy:  Heparin level 0.3-0.7 units/ml Monitor platelets by anticoagulation protocol: Yes   Plan:  Continue heparin gtt at 800 units/hr.  Daily CBC, heparin level.  94,  PharmD Clinical Pharmacist **Pharmacist phone directory can now be found on [email or web address] ([email or web address]).  Listed under [facility].

## 2021-06-11 NOTE — Plan of Care (Signed)
  Problem: Health Behavior/Discharge Planning: Goal: Ability to manage health-related needs will improve Outcome: Progressing   Problem: Clinical Measurements: Goal: Will remain free from infection Outcome: Progressing   

## 2021-06-12 DIAGNOSIS — E785 Hyperlipidemia, unspecified: Secondary | ICD-10-CM

## 2021-06-12 DIAGNOSIS — I2510 Atherosclerotic heart disease of native coronary artery without angina pectoris: Secondary | ICD-10-CM

## 2021-06-12 DIAGNOSIS — N179 Acute kidney failure, unspecified: Secondary | ICD-10-CM

## 2021-06-12 HISTORY — DX: Hyperlipidemia, unspecified: E78.5

## 2021-06-12 HISTORY — DX: Atherosclerotic heart disease of native coronary artery without angina pectoris: I25.10

## 2021-06-12 HISTORY — DX: Acute kidney failure, unspecified: N17.9

## 2021-06-12 LAB — CBC
HCT: 33.1 % — ABNORMAL LOW (ref 39.0–52.0)
Hemoglobin: 11.1 g/dL — ABNORMAL LOW (ref 13.0–17.0)
MCH: 30.6 pg (ref 26.0–34.0)
MCHC: 33.5 g/dL (ref 30.0–36.0)
MCV: 91.2 fL (ref 80.0–100.0)
Platelets: 180 10*3/uL (ref 150–400)
RBC: 3.63 MIL/uL — ABNORMAL LOW (ref 4.22–5.81)
RDW: 13.3 % (ref 11.5–15.5)
WBC: 9 10*3/uL (ref 4.0–10.5)
nRBC: 0 % (ref 0.0–0.2)

## 2021-06-12 LAB — BASIC METABOLIC PANEL
Anion gap: 10 (ref 5–15)
BUN: 26 mg/dL — ABNORMAL HIGH (ref 8–23)
CO2: 19 mmol/L — ABNORMAL LOW (ref 22–32)
Calcium: 8.3 mg/dL — ABNORMAL LOW (ref 8.9–10.3)
Chloride: 108 mmol/L (ref 98–111)
Creatinine, Ser: 1.8 mg/dL — ABNORMAL HIGH (ref 0.61–1.24)
GFR, Estimated: 34 mL/min — ABNORMAL LOW (ref >60)
Glucose, Bld: 101 mg/dL — ABNORMAL HIGH (ref 70–99)
Potassium: 3.8 mmol/L (ref 3.5–5.1)
Sodium: 137 mmol/L (ref 135–145)

## 2021-06-12 LAB — HEPARIN LEVEL (UNFRACTIONATED): Heparin Unfractionated: 0.48 IU/mL (ref 0.30–0.70)

## 2021-06-12 MED ORDER — ISOSORBIDE MONONITRATE ER 30 MG PO TB24: 15.0000 mg | ORAL_TABLET | Freq: Every day | ORAL | 3 refills | Status: DC

## 2021-06-12 MED ORDER — ASPIRIN 81 MG PO CHEW: 81.0000 mg | CHEWABLE_TABLET | Freq: Every day | ORAL | 3 refills | Status: DC

## 2021-06-12 MED ORDER — NITROGLYCERIN 0.4 MG SL SUBL
0.4000 mg | SUBLINGUAL_TABLET | SUBLINGUAL | 2 refills | Status: DC | PRN
Start: 2021-06-12 — End: 2024-02-06

## 2021-06-12 MED ORDER — ATORVASTATIN CALCIUM 80 MG PO TABS
80.0000 mg | ORAL_TABLET | Freq: Every day | ORAL | 2 refills | Status: DC
Start: 2021-06-12 — End: 2021-09-01

## 2021-06-12 MED ORDER — METOPROLOL TARTRATE 25 MG PO TABS: 12.5000 mg | ORAL_TABLET | Freq: Two times a day (BID) | ORAL | 2 refills | Status: DC

## 2021-06-12 NOTE — Progress Notes (Signed)
Came into room to assess pt and found that IV site was leaking. Attempted to change dressing and save iv site. IV was not in properly and therefore was removed. Applied pressure the site, cleansed the pt's arm, and placed a pressure dressing. Iv team has been consulted as this is the second iv the pt has loss in 24 hours. Will continue to monitor pt and site.

## 2021-06-12 NOTE — Progress Notes (Signed)
ANTICOAGULATION CONSULT NOTE - Follow Up Consult  Pharmacy Consult for Heparin Indication: chest pain/ACS  No Known Allergies  Patient Measurements: Height: 5\' 5"  (165.1 cm) Weight: 53.9 kg (118 lb 12.8 oz) IBW/kg (Calculated) : 61.5 Heparin Dosing Weight: 54.4 kg   Vital Signs: Temp: 97.9 F (36.6 C) (01/22 0749) Temp Source: Axillary (01/22 0749) BP: 119/54 (01/22 0908) Pulse Rate: 64 (01/22 0749)  Labs: Recent Labs    06/10/21 0223 06/10/21 0223 06/10/21 0413 06/11/21 0220 06/11/21 1124 06/11/21 1926 06/12/21 0312 06/12/21 0804  HGB 13.6  --   --  11.5*  --   --  11.1*  --   HCT 41.4  --   --  35.1*  --   --  33.1*  --   PLT 219  --   --  195  --   --  180  --   HEPARINUNFRC  --    < >  --  <0.10* 0.33 0.57  --  0.48  CREATININE 1.48*  --   --  1.53*  --   --  1.80*  --   TROPONINIHS 132*  --  1,704*  --   --   --   --   --    < > = values in this interval not displayed.    Estimated Creatinine Clearance: 17.9 mL/min (A) (by C-G formula based on SCr of 1.8 mg/dL (H)).   Medications:  Scheduled:   amLODipine  5 mg Oral Daily   aspirin  81 mg Oral Daily   atorvastatin  80 mg Oral q1800   influenza vaccine adjuvanted  0.5 mL Intramuscular Tomorrow-1000   isosorbide mononitrate  15 mg Oral Daily   metoprolol tartrate  12.5 mg Oral BID   pantoprazole  40 mg Oral Daily   sodium chloride flush  3 mL Intravenous Q12H   sodium chloride flush  3 mL Intravenous Q12H   Infusions:   sodium chloride     nitroGLYCERIN Stopped (06/12/21 0836)    Assessment: 88 yo M presenting with c/o abdominal pain and rising troponins s/p LHC found to have 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 Patient was not on anticoagulation PTA. Pharmacy consulted for heparin dosing. Heparin was resumed 4 hours post-sheath removal.   Heparin level today is therapeutic at 0.48, on 800 units/hr. Hgb 11.1, plt 180. After heparin level was drawn this  morning, RN reported that the patient's IV site was found to be leaking (unclear for how long), heparin was stopped for ~45 minutes while cleaning and placing a pressure dressing on the patient's arm - Cardiology MD was made aware, and heparin gtt was discontinued. No signs/symptoms of bleeding noted per RN.   Goal of Therapy:  Heparin level 0.3-0.7 units/ml Monitor platelets by anticoagulation protocol: Yes   Plan:  1/22 AM - Stop heparin gtt per Cardiology (Dr. Johnsie Cancel).    Vance Peper, PharmD PGY1 Pharmacy Resident Phone [phone] 06/12/2021 9:09 AM   Please check AMION for all [place] phone numbers After 10:00 PM, call [place] [phone]

## 2021-06-12 NOTE — Discharge Instructions (Signed)
Post NSTEMI: °NO HEAVY LIFTING X 2 WEEKS. °NO SEXUAL ACTIVITY X 2 WEEKS. °NO DRIVING X 1 WEEK. °NO SOAKING BATHS, HOT TUBS, POOLS, ETC., X 7 DAYS. ° °Radial Site Care: °Refer to this sheet in the next few weeks. These instructions provide you with information on caring for yourself after your procedure. Your caregiver may also give you more specific instructions. Your treatment has been planned according to current medical practices, but problems sometimes occur. Call your caregiver if you have any problems or questions after your procedure. °HOME CARE INSTRUCTIONS °· You may shower the day after the procedure. Remove the bandage (dressing) and gently wash the site with plain soap and water. Gently pat the site dry.  °· Do not apply powder or lotion to the site.  °· Do not submerge the affected site in water for 3 to 5 days.  °· Inspect the site at least twice daily.  °· Do not flex or bend the affected arm for 24 hours.  °· No lifting over 5 pounds (2.3 kg) for 5 days after your procedure.  °· Do not drive home if you are discharged the same day of the procedure. Have someone else drive you.  °What to expect: °· Any bruising will usually fade within 1 to 2 weeks.  °· Blood that collects in the tissue (hematoma) may be painful to the touch. It should usually decrease in size and tenderness within 1 to 2 weeks.  °SEEK IMMEDIATE MEDICAL CARE IF: °· You have unusual pain at the radial site.  °· You have redness, warmth, swelling, or pain at the radial site.  °· You have drainage (other than a small amount of blood on the dressing).  °· You have chills.  °· You have a fever or persistent symptoms for more than 72 hours.  °· You have a fever and your symptoms suddenly get worse.  °· Your arm becomes pale, cool, tingly, or numb.  °· You have heavy bleeding from the site. Hold pressure on the site.  ° ° °

## 2021-06-12 NOTE — Discharge Summary (Signed)
Discharge Summary    Patient ID: Hector Lowe MRN: [id number]; DOB: May 04, 1922  Admit date: 06/10/2021 Discharge date: 06/12/2021  PCP:  Caryl Bis, MD   [facility] Providers Cardiologist:  None    Discharge Diagnoses    Principal Problem:   NSTEMI (non-ST elevated myocardial infarction) [facility]) Active Problems:   CAD (coronary artery disease)   Essential hypertension   Hyperlipidemia LDL goal <70   Mild AKI    Diagnostic Studies/Procedures    Echocardiogram 06/10/2021: Impressions:  1. Left ventricular ejection fraction, by estimation, is 60 to 65%. The  left ventricle has normal function. The left ventricle has no regional  wall motion abnormalities. Left ventricular diastolic parameters are  consistent with Grade I diastolic  dysfunction (impaired relaxation).   2. No subcostal views to visualize IVC for RVSP. Right ventricular  systolic function is normal. The right ventricular size is normal.   3. A small pericardial effusion is present. There is no evidence of  cardiac tamponade.   4. The mitral valve is degenerative. Mild mitral valve regurgitation.   5. The aortic valve was not well visualized. Aortic valve regurgitation  is not visualized.   6. The inferior vena cava not visualized.   Comparison(s): No prior Echocardiogram.  _____________  Left Cardiac Catheterization 06/10/2021:   Ost LM lesion is 80% stenosed.   1st Diag lesion is 95% stenosed.   Ost RCA lesion is 50% stenosed.   RPDA lesion is 50% stenosed.   Impressions: Hector Lowe has high-grade ostial/proximal left main disease, high-grade proximal first diagonal branch disease and moderate ostial large dominant RCA disease that does not look significant.  He is not a CABG candidate.  At this point, short of performing left main intervention, I recommend antianginal therapy.  The sheath was removed and a TR band was placed on the right wrist to achieve patent hemostasis.  The patient left the  lab in stable condition.  I am going to start IV heparin 4 hours after sheath removal and continue heparin for 48 hours.  We will begin him on dual antiplatelet therapy.  I had a long discussion with the patient's son and daughter-in-law who agreed with the plan of medical therapy alone.  Suspect he will be ready for discharge either Sunday or Monday.  Diagnostic Dominance: Right   History of Present Illness     Hector Lowe is a 88 y.o. male with a history of hypertension who was admitted on 06/10/2021 with a NSTEMI after presenting with chest pain.  Patient never been seen by cardiology prior to this admission.  He follows with his PCP for hypertension.  He is a very active 88 year old.  He lives at home independently and has family nearby who checks in on him.  He reported being in his usual state of health until the evening of 06/09/2021. He went to bed around 11:30pm and then he woke up around midnight with severe chest pain he stated actually started in his lower abdomen and then radiated up into his chest.  He got up and took Pepto-Bismol without any relief.  Chest pain lingered for about 30 minutes so he called 911.  He was taken to the [facility], ED and given Aspirin 324 mg and sublingual Nitro with resolution of his pain.  In the ED, EKG showed slight ST depression in inferior lateral leads and slight elevation in lead aVR.  High-sensitivity troponin 132 >> 1,704. Na 136, K 4.5, Cr 1.48. WBC 7.9, Hgb  13.6. Abdominal CT showed small hiatal hernia and was suspicious for distal esophagitis.  He developed recurrent chest pain in the ED with repeat EKG showing continued ST depressions in inferolateral leads but also slight elevation in lead V2.  He was started on IV Heparin and IV Nitro and decision was made to transfer patient to [facility] for further evaluation.  Hospital Course     Consultants: None  NSTEMI Patient admitted with NSTEMI as stated above.  High-sensitivity troponin peaked at  1,704.  Echo showed LVEF of 60 to 65% with no regional wall motion abnormalities, grade 1 diastolic dysfunction, and mild Hector.  Already a catheterization showed 80% ostial left main disease as well as 95% stenosis of first diagonal, 50% stenosis of ostial RCA, and 50% stenosis of RPDA.  Given advanced age, he is not a candidate for CABG or high risk Impella supported intervention.  Therefore, decision was made to treat medically.  IV heparin was continued for 48 hours. He was started on Aspirin 81mg  daily, Imdur 15mg  daily, Lopressor 12.5mg  twice daily, and Lipitor 80mg  daily and home Amlodipine was continued. He did well with this with no recurrent chest pain and is felt to be stable for discharge. Of note, he was not started on Plavix due to his age and the fact that no PCI was performed.  Hypertension BP well controlled. Continue  low dose Imdur and Lopressor as well as home Amlodipine 5mg  daily.   Hyperlipidemia Lipid panel this admission: Total Cholesterol 149, Triglycerides 42, HDL 44, LDL 97. LDL goal <70 given CAD. Started on Lipitor 80mg  daily. Will need repeat lipid panel and LFTs in 6-8 weeks.  Mild AKI Creatinine 1.48 on admission and peaked to 1.80 on day of discharge following contrast. Baseline around 1.3 to 1.5. Not on any nephrotoxic agents. Will need repeat BMET at follow-up visit.  Patient seen and examined by Dr. Johnsie Cancel and determined to be stable for discharge. Outpatient follow-up (TOC visit) will be arranged. Medications as below.  Did the patient have an acute coronary syndrome (MI, NSTEMI, STEMI, etc) this admission?:  Yes                               AHA/ACC Clinical Performance & Quality Measures: Aspirin prescribed? - Yes ADP Receptor Inhibitor (Plavix/Clopidogrel, Brilinta/Ticagrelor or Effient/Prasugrel) prescribed (includes medically managed patients)? - No - advanced age and no PCI performed Beta Blocker prescribed? - Yes High Intensity Statin (Lipitor 40-80mg  or  Crestor 20-40mg ) prescribed? - Yes EF assessed during THIS hospitalization? - Yes For EF <40%, was ACEI/ARB prescribed? - Not Applicable (EF >/= [id number]) For EF <40%, Aldosterone Antagonist (Spironolactone or Eplerenone) prescribed? - Not Applicable (EF >/= [id number]) Cardiac Rehab Phase II ordered (including medically managed patients)? - Yes    The patient will be scheduled for a TOC follow up appointment with 2 weeks.  A message has been sent to the [facility] and Scheduling Pool at the office where the patient should be seen for follow up.  _____________  Discharge Vitals Blood pressure (!) 119/54, pulse 64, temperature 97.9 F (36.6 C), temperature source Axillary, resp. rate 16, height 5\' 5"  (1.651 m), weight 53.9 kg, SpO2 96 %.  Filed Weights   06/10/21 0201 06/12/21 0256  Weight: 54.4 kg 53.9 kg    Labs & Radiologic Studies    CBC Recent Labs    06/11/21 0220 06/12/21 0312  WBC 7.4 9.0  HGB  11.5* 11.1*  HCT 35.1* 33.1*  MCV 90.7 91.2  PLT 195 [id number]   Basic Metabolic Panel Recent Labs    06/11/21 0220 06/12/21 0312  NA 137 137  K 4.1 3.8  CL 108 108  CO2 21* 19*  GLUCOSE 93 101*  BUN 17 26*  CREATININE 1.53* 1.80*  CALCIUM 8.3* 8.3*   Liver Function Tests Recent Labs    06/10/21 0223  AST 26  ALT 17  ALKPHOS 141*  BILITOT 1.0  PROT 7.4  ALBUMIN 4.0   Recent Labs    06/10/21 0223  LIPASE 47   High Sensitivity Troponin:   Recent Labs  Lab 06/10/21 0223 06/10/21 0413  TROPONINIHS 132* 1,704*    BNP Invalid input(s): POCBNP D-Dimer No results for input(s): DDIMER in the last 72 hours. Hemoglobin A1C Recent Labs    06/10/21 1545  HGBA1C 5.5   Fasting Lipid Panel Recent Labs    06/11/21 0220  CHOL 149  HDL 44  LDLCALC 97  TRIG 42  CHOLHDL 3.4   Thyroid Function Tests No results for input(s): TSH, T4TOTAL, T3FREE, THYROIDAB in the last 72 hours.  Invalid input(s): FREET3 _____________  CT ABDOMEN PELVIS W CONTRAST  Result Date:  06/10/2021 CLINICAL DATA:  Abdominal pain. EXAM: CT ABDOMEN AND PELVIS WITH CONTRAST TECHNIQUE: Multidetector CT imaging of the abdomen and pelvis was performed using the standard protocol following bolus administration of intravenous contrast. RADIATION DOSE REDUCTION: This exam was performed according to the departmental dose-optimization program which includes automated exposure control, adjustment of the mA and/or kV according to patient size and/or use of iterative reconstruction technique. CONTRAST:  182mL OMNIPAQUE IOHEXOL 300 MG/ML  SOLN COMPARISON:  March 07, 2020 FINDINGS: Lower chest: No acute abnormality. Hepatobiliary: No focal liver abnormality is seen. No gallstones, gallbladder wall thickening, or biliary dilatation. Pancreas: Unremarkable. No pancreatic ductal dilatation or surrounding inflammatory changes. Spleen: Normal in size without focal abnormality. Adrenals/Urinary Tract: Adrenal glands are unremarkable. Kidneys are normal, without renal calculi, focal lesion, or hydronephrosis. A large, approximately 4.0 cm x 2.1 cm, stable posterior urinary bladder wall diverticulum is seen. Stomach/Bowel: There is a small hiatal hernia with mild to moderate severity diffuse thickening of the distal esophageal wall. This is stable in appearance when compared to the prior study. Appendix appears normal. No evidence of bowel wall thickening, distention, or inflammatory changes. Vascular/Lymphatic: Aortic atherosclerosis. No enlarged abdominal or pelvic lymph nodes. Reproductive: The prostate gland is moderately enlarged. Other: No abdominal wall hernia or abnormality. No abdominopelvic ascites. Musculoskeletal: Multilevel degenerative changes seen throughout the lumbar spine. IMPRESSION: 1. Small hiatal hernia with findings suspicious for distal esophagitis. Sequelae associated with an underlying neoplastic process cannot be excluded. 2. Stable posterior urinary bladder wall diverticulum. 3. Enlarged  prostate gland. 4. Aortic atherosclerosis. Aortic Atherosclerosis (ICD10-I70.0). Electronically Signed   By: Virgina Norfolk M.D.   On: 06/10/2021 03:50   CARDIAC CATHETERIZATION  Addendum Date: 06/10/2021     Ost LM lesion is 80% stenosed.   1st Diag lesion is 95% stenosed.   Ost RCA lesion is 50% stenosed.   RPDA lesion is 50% stenosed. Hector Lowe is a 88 y.o. male  [id number] LOCATION:  FACILITY: [place] PHYSICIAN: Quay Burow, M.D. [birth year] DATE OF PROCEDURE:  06/10/2021 DATE OF DISCHARGE: CARDIAC CATHETERIZATION History obtained from chart review. Hector Guandique is a 88 year old single Caucasian male with history of hypertension who presented with unstable angina and positive enzymes consistent with non-STEMI.  His EKG showed no acute changes.  2D echo was essentially normal.  He presents now for diagnostic coronary angiography. IMPRESSION:Hector Slough has high-grade ostial/proximal left main disease, high-grade proximal first diagonal branch disease and moderate ostial large dominant RCA disease that does not look significant.  He is not a CABG candidate.  At this point, short of performing left main intervention, I recommend antianginal therapy.  The sheath was removed and a TR band was placed on the right wrist to achieve patent hemostasis.  The patient left the lab in stable condition.  I am going to start IV heparin 4 hours after sheath removal and continue heparin for 48 hours.  We will begin him on dual antiplatelet therapy.  I had a long discussion with the patient's son and daughter-in-law who agreed with the plan of medical therapy alone.  Suspect he will be ready for discharge either Sunday or Monday. Hector Lowe. MD, FACC 06/10/2021 2:03 PM    Result Date: 06/10/2021 Images from the original result were not included.   Ost LM lesion is 80% stenosed.   1st Diag lesion is 95% stenosed.   Ost RCA lesion is 50% stenosed.   RPDA lesion is 50% stenosed. Hector Lowe is a 88 y.o. male  [id number]  LOCATION:  FACILITY: [facility] PHYSICIAN: Hector Lowe, M.D. 06/09/1921 DATE OF PROCEDURE:  06/10/2021 DATE OF DISCHARGE: CARDIAC CATHETERIZATION History obtained from chart review. Hector Liska is a 88 year old single Caucasian male with history of hypertension who presented with unstable angina and positive enzymes consistent with non-STEMI.  His EKG showed no acute changes.  2D echo was essentially normal.  He presents now for diagnostic coronary angiography. IMPRESSION:Hector Slough has high-grade ostial/proximal left main disease, high-grade proximal first diagonal branch disease and moderate ostial large dominant RCA disease that does not look significant.  He is not a CABG candidate.  At this point, short of performing left main intervention, I recommend antianginal therapy.  The sheath was removed and a TR band was placed on the right wrist to achieve patent hemostasis.  The patient left the lab in stable condition.  I am going to start IV heparin 4 hours after sheath removal pending determination of final treatment strategy. Hector Lowe. MD, FACC 06/10/2021 2:03 PM    ECHOCARDIOGRAM COMPLETE  Result Date: 06/10/2021    ECHOCARDIOGRAM REPORT   Patient Name:   Hector Lowe Date of Exam: 06/10/2021 Medical Rec #:  [id number]      Height:       65.0 in Accession #:    [id number]     Weight:       120.0 lb Date of Birth:  09/15/1921     BSA:          1.592 m Patient Age:    88 years       BP:           126/67 mmHg Patient Gender: M              HR:           72  bpm. Exam Location:  Inpatient Procedure: 2D Echo, Cardiac Doppler and Color Doppler Indications:    R07.9* Chest pain, unspecified  History:        Patient has no prior history of Echocardiogram examinations.                 Risk Factors:Hypertension.  Sonographer:    Bernadene Person RDCS Referring Phys: [phone] [facility]  Sonographer Comments: No subcostal window. IMPRESSIONS  1.  Left ventricular ejection fraction, by estimation, is 60 to 65%. The left  ventricle has normal function. The left ventricle has no regional wall motion abnormalities. Left ventricular diastolic parameters are consistent with Grade I diastolic dysfunction (impaired relaxation).  2. No subcostal views to visualize IVC for RVSP. Right ventricular systolic function is normal. The right ventricular size is normal.  3. A small pericardial effusion is present. There is no evidence of cardiac tamponade.  4. The mitral valve is degenerative. Mild mitral valve regurgitation.  5. The aortic valve was not well visualized. Aortic valve regurgitation is not visualized.  6. The inferior vena cava not visualized. Comparison(s): No prior Echocardiogram. FINDINGS  Left Ventricle: Left ventricular ejection fraction, by estimation, is 60 to 65%. The left ventricle has normal function. The left ventricle has no regional wall motion abnormalities. The left ventricular internal cavity size was normal in size. There is  no left ventricular hypertrophy. Left ventricular diastolic parameters are consistent with Grade I diastolic dysfunction (impaired relaxation). Right Ventricle: No subcostal views to visualize IVC for RVSP. The right ventricular size is normal. No increase in right ventricular wall thickness. Right ventricular systolic function is normal. Left Atrium: Left atrial size was not well visualized. Right Atrium: Right atrial size was normal in size. Pericardium: A small pericardial effusion is present. There is no evidence of cardiac tamponade. Mitral Valve: The mitral valve is degenerative in appearance. Mild mitral valve regurgitation. Tricuspid Valve: The tricuspid valve is not well visualized. Tricuspid valve regurgitation is mild. Aortic Valve: The aortic valve was not well visualized. Aortic valve regurgitation is not visualized. Pulmonic Valve: The pulmonic valve was not well visualized. Pulmonic valve regurgitation is not visualized. Aorta: The aortic root and ascending aorta are structurally  normal, with no evidence of dilitation. Venous: The inferior vena cava not visualized. IAS/Shunts: No atrial level shunt detected by color flow Doppler.  LEFT VENTRICLE PLAX 2D LVIDd:         3.40 cm     Diastology LVIDs:         2.40 cm     LV e' medial:    5.25 cm/s LV PW:         0.90 cm     LV E/e' medial:  16.3 LV IVS:        0.90 cm     LV e' lateral:   6.68 cm/s LVOT diam:     2.00 cm     LV E/e' lateral: 12.8 LV SV:         48 LV SV Index:   30 LVOT Area:     3.14 cm  LV Volumes (MOD) LV vol d, MOD A2C: 46.1 ml LV vol d, MOD A4C: 70.7 ml LV vol s, MOD A2C: 19.5 ml LV vol s, MOD A4C: 33.5 ml LV SV MOD A2C:     26.6 ml LV SV MOD A4C:     70.7 ml LV SV MOD BP:      30.7 ml RIGHT VENTRICLE RV S prime:     10.50 cm/s TAPSE (M-mode): 1.8 cm LEFT ATRIUM             Index        RIGHT ATRIUM           Index LA diam:        2.40 cm 1.51 cm/m   RA Area:     16.20 cm LA Vol (A2C):   29.7 ml 18.65 ml/m  RA Volume:  39.50 ml  24.81 ml/m LA Vol (A4C):   25.6 ml 16.08 ml/m LA Biplane Vol: 29.7 ml 18.65 ml/m  AORTIC VALVE LVOT Vmax:   69.80 cm/s LVOT Vmean:  47.200 cm/s LVOT VTI:    0.154 m  AORTA Ao Root diam: 3.00 cm Ao Asc diam:  3.00 cm MITRAL VALVE               TRICUSPID VALVE MV Area (PHT): 3.53 cm    TR Peak grad:   30.0 mmHg MV Decel Time: 215 msec    TR Vmax:        274.00 cm/s MV E velocity: 85.40 cm/s MV A velocity: 95.60 cm/s  SHUNTS MV E/A ratio:  0.89        Systemic VTI:  0.15 m                            Systemic Diam: 2.00 cm Phineas Inches Electronically signed by Phineas Inches Signature Date/Time: 06/10/2021/11:35:34 AM    Final    Disposition   Patient is being discharged home today in good condition.  Follow-up Plans & Appointments     Follow-up Information     [facility].   Specialty: Cardiology Why: Our office will call you to schedule a follow-up visit. If you do not hear from [place] within 2 business days, please call our office. Contact information: [place] [address]               Discharge Instructions     Amb Referral to Cardiac Rehabilitation   Complete by: As directed    Diagnosis: NSTEMI   After initial evaluation and assessments completed: Virtual Based Care may be provided alone or in conjunction with Phase 2 Cardiac Rehab based on patient barriers.: Yes   Diet - low sodium heart healthy   Complete by: As directed    Increase activity slowly   Complete by: As directed        Discharge Medications   Allergies as of 06/12/2021   No Known Allergies      Medication List     TAKE these medications    amLODipine 5 MG tablet Commonly known as: NORVASC Take 1 tablet (5 mg total) by mouth daily.   aspirin 81 MG chewable tablet Chew 1 tablet (81 mg total) by mouth daily. Start taking on: June 13, 2021   atorvastatin 80 MG tablet Commonly known as: LIPITOR Take 1 tablet (80 mg total) by mouth daily at 6 PM.   isosorbide mononitrate 30 MG 24 hr tablet Commonly known as: IMDUR Take 0.5 tablets (15 mg total) by mouth daily. Start taking on: June 13, 2021   linaclotide 290 MCG Caps capsule Commonly known as: Linzess Take 1 capsule (290 mcg total) by mouth daily before breakfast.   metoprolol tartrate 25 MG tablet Commonly known as: LOPRESSOR Take 0.5 tablets (12.5 mg total) by mouth 2 (two) times daily.   nitroGLYCERIN 0.4 MG SL tablet Commonly known as: NITROSTAT Place 1 tablet (0.4 mg total) under the tongue every 5 (five) minutes x 3 doses as needed for chest pain.   omeprazole 20 MG capsule Commonly known as: PRILOSEC Take 1 capsule (20 mg total) by mouth 2 (two) times daily before a meal.   polyethylene glycol 17 g packet Commonly known as: MIRALAX / GLYCOLAX Take 17 g by mouth 2 (two) times daily. What  changed:  how much to take when to take this additional instructions   PRESERVISION AREDS PO Take 1 capsule by mouth in the morning and at bedtime.            Outstanding Labs/Studies   Repeat BMET at follow-up visit. Repeat lipid panel and LFTs in 6-8 weeks.  Duration of Discharge Encounter   Greater than 30 minutes including physician time.  Signed, Darreld Mclean, PA-C 06/12/2021, 11:29 AM

## 2021-06-12 NOTE — Progress Notes (Signed)
NURSING PROGRESS NOTE  Hector Lowe [id number] Discharge Data: 06/12/2021 4:11 PM Attending Provider: Runell Gess, MD [facility]:[id number], [name], MD     Wilburn Mylar to be D/C'd Home per MD order.  Discussed with the patient the After Visit Summary and all questions fully answered. All IV's discontinued with no bleeding noted. All belongings returned to patient for patient to take home.   Last Vital Signs:  Blood pressure 122/60, pulse 63, temperature 97.8 F (36.6 C), temperature source Oral, resp. rate 18, height 5\' 5"  (1.651 m), weight 53.9 kg, SpO2 100 %.  Discharge Medication List Allergies as of 06/12/2021   No Known Allergies      Medication List     TAKE these medications    amLODipine 5 MG tablet Commonly known as: NORVASC Take 1 tablet (5 mg total) by mouth daily.   aspirin 81 MG chewable tablet Chew 1 tablet (81 mg total) by mouth daily. Start taking on: June 13, 2021   atorvastatin 80 MG tablet Commonly known as: LIPITOR Take 1 tablet (80 mg total) by mouth daily at 6 PM.   isosorbide mononitrate 30 MG 24 hr tablet Commonly known as: IMDUR Take 0.5 tablets (15 mg total) by mouth daily. Start taking on: June 13, 2021   linaclotide 290 MCG Caps capsule Commonly known as: Linzess Take 1 capsule (290 mcg total) by mouth daily before breakfast.   metoprolol tartrate 25 MG tablet Commonly known as: LOPRESSOR Take 0.5 tablets (12.5 mg total) by mouth 2 (two) times daily.   nitroGLYCERIN 0.4 MG SL tablet Commonly known as: NITROSTAT Place 1 tablet (0.4 mg total) under the tongue every 5 (five) minutes x 3 doses as needed for chest pain.   omeprazole 20 MG capsule Commonly known as: PRILOSEC Take 1 capsule (20 mg total) by mouth 2 (two) times daily before a meal.   polyethylene glycol 17 g packet Commonly known as: MIRALAX / GLYCOLAX Take 17 g by mouth 2 (two) times daily. What changed:  how much to take when to take this additional  instructions   PRESERVISION AREDS PO Take 1 capsule by mouth in the morning and at bedtime.

## 2021-06-12 NOTE — Progress Notes (Signed)
Cardiologist: Gwenlyn Found  Subjective:  No chest pain  Constipated has not had Miralax/Metamucil in 3 days  He has walked the halls and thinks he would do better at home Having issues with iv access Nitro/heparin d/c   Objective:  Vitals:   06/12/21 0335 06/12/21 0635 06/12/21 0749 06/12/21 0908  BP: (!) 113/58 (!) 108/50 (!) 112/50 (!) 119/54  Pulse:   64   Resp:   16   Temp:   97.9 F (36.6 C)   TempSrc:   Axillary   SpO2:   96%   Weight:      Height:        Intake/Output from previous day:  Intake/Output Summary (Last 24 hours) at 06/12/2021 0947 Last data filed at 06/12/2021 0600 Gross per 24 hour  Intake 589.21 ml  Output 250 ml  Net 339.21 ml    Physical Exam: Affect appropriate Elderly male  HEENT: normal Neck supple with no adenopathy JVP normal no bruits no thyromegaly Lungs clear with no wheezing and good diaphragmatic motion Heart:  S1/S2 no murmur, no rub, gallop or click PMI normal Abdomen: benighn, BS positve, no tenderness, no AAA no bruit.  No HSM or HJR Distal pulses intact with no bruits No edema Neuro non-focal Skin warm and dry No muscular weakness   Lab Results: Basic Metabolic Panel: Recent Labs    06/11/21 0220 06/12/21 0312  NA 137 137  K 4.1 3.8  CL 108 108  CO2 21* 19*  GLUCOSE 93 101*  BUN 17 26*  CREATININE 1.53* 1.80*  CALCIUM 8.3* 8.3*   Liver Function Tests: Recent Labs    06/10/21 0223  AST 26  ALT 17  ALKPHOS 141*  BILITOT 1.0  PROT 7.4  ALBUMIN 4.0   Recent Labs    06/10/21 0223  LIPASE 47   CBC: Recent Labs    06/11/21 0220 06/12/21 0312  WBC 7.4 9.0  HGB 11.5* 11.1*  HCT 35.1* 33.1*  MCV 90.7 91.2  PLT 195 180   Cardiac Enzymes: No results for input(s): CKTOTAL, CKMB, CKMBINDEX, TROPONINI in the last 72 hours. BNP: Invalid input(s): POCBNP D-Dimer: No results for input(s): DDIMER in the last 72 hours. Hemoglobin A1C: Recent Labs    06/10/21 1545  HGBA1C 5.5   Fasting Lipid  Panel: Recent Labs    06/11/21 0220  CHOL 149  HDL 44  LDLCALC 97  TRIG 42  CHOLHDL 3.4     Imaging: CARDIAC CATHETERIZATION  Addendum Date: 06/10/2021     Ost LM lesion is 80% stenosed.   1st Diag lesion is 95% stenosed.   Ost RCA lesion is 50% stenosed.   RPDA lesion is 50% stenosed. JARIEN FRIESZ is a 88 y.o. male  VW:[id number] LOCATION:  FACILITY: [facility] PHYSICIAN: Quay Burow, M.D. 06-23-1921 DATE OF PROCEDURE:  06/10/2021 DATE OF DISCHARGE: CARDIAC CATHETERIZATION History obtained from chart review. Mr Voth is a 88 year old single Caucasian male with history of hypertension who presented with unstable angina and positive enzymes consistent with non-STEMI.  His EKG showed no acute changes.  2D echo was essentially normal.  He presents now for diagnostic coronary angiography. IMPRESSION:Mr Slough has high-grade ostial/proximal left main disease, high-grade proximal first diagonal branch disease and moderate ostial large dominant RCA disease that does not look significant.  He is not a CABG candidate.  At this point, short of performing left main intervention, I recommend antianginal therapy.  The sheath was removed and a TR band was placed on the right  wrist to achieve patent hemostasis.  The patient left the lab in stable condition.  I am going to start IV heparin 4 hours after sheath removal and continue heparin for 48 hours.  We will begin him on dual antiplatelet therapy.  I had a long discussion with the patient's son and daughter-in-law who agreed with the plan of medical therapy alone.  Suspect he will be ready for discharge either Sunday or Monday. Jonathan Berry. MD, FACC 06/10/2021 2:03 PM    Result Date: 06/10/2021 Images from the original result were not included.   Ost LM lesion is 80% stenosed.   1st Diag lesion is 95% stenosed.   Ost RCA lesion is 50% stenosed.   RPDA lesion is 50% stenosed. Obrian J Deman is a 88 y.o. male  [id number] LOCATION:  FACILITY: [facility] PHYSICIAN: Jonathan  Berry, M.D. 03/26/1922 DATE OF PROCEDURE:  06/10/2021 DATE OF DISCHARGE: CARDIAC CATHETERIZATION History obtained from chart review. Mr Couts is a 88 year old single Caucasian male with history of hypertension who presented with unstable angina and positive enzymes consistent with non-STEMI.  His EKG showed no acute changes.  2D echo was essentially normal.  He presents now for diagnostic coronary angiography. IMPRESSION:Mr Slough has high-grade ostial/proximal left main disease, high-grade proximal first diagonal branch disease and moderate ostial large dominant RCA disease that does not look significant.  He is not a CABG candidate.  At this point, short of performing left main intervention, I recommend antianginal therapy.  The sheath was removed and a TR band was placed on the right wrist to achieve patent hemostasis.  The patient left the lab in stable condition.  I am going to start IV heparin 4 hours after sheath removal pending determination of final treatment strategy. Jonathan Berry. MD, FACC 06/10/2021 2:03 PM    ECHOCARDIOGRAM COMPLETE  Result Date: 06/10/2021    ECHOCARDIOGRAM REPORT   Patient Name:   Jaishawn J Southwell Date of Exam: 06/10/2021 Medical Rec #:  [id number]      Height:       65.0 in Accession #:    [id number]     Weight:       120.0 lb Date of Birth:  01/18/1922     BSA:          1.592 m Patient Age:    88 years       BP:           126/67 mmHg Patient Gender: M              HR:           72  bpm. Exam Location:  Inpatient Procedure: 2D Echo, Cardiac Doppler and Color Doppler Indications:    R07.9* Chest pain, unspecified  History:        Patient has no prior history of Echocardiogram examinations.                 Risk Factors:Hypertension.  Sonographer:    Bernadene Person RDCS Referring Phys: [phone] [facility] B ROBERTS  Sonographer Comments: No subcostal window. IMPRESSIONS  1. Left ventricular ejection fraction, by estimation, is 60 to 65%. The left ventricle has normal function. The left  ventricle has no regional wall motion abnormalities. Left ventricular diastolic parameters are consistent with Grade I diastolic dysfunction (impaired relaxation).  2. No subcostal views to visualize IVC for RVSP. Right ventricular systolic function is normal. The right ventricular size is normal.  3. A small pericardial effusion is present. There is no evidence of  cardiac tamponade.  4. The mitral valve is degenerative. Mild mitral valve regurgitation.  5. The aortic valve was not well visualized. Aortic valve regurgitation is not visualized.  6. The inferior vena cava not visualized. Comparison(s): No prior Echocardiogram. FINDINGS  Left Ventricle: Left ventricular ejection fraction, by estimation, is 60 to 65%. The left ventricle has normal function. The left ventricle has no regional wall motion abnormalities. The left ventricular internal cavity size was normal in size. There is  no left ventricular hypertrophy. Left ventricular diastolic parameters are consistent with Grade I diastolic dysfunction (impaired relaxation). Right Ventricle: No subcostal views to visualize IVC for RVSP. The right ventricular size is normal. No increase in right ventricular wall thickness. Right ventricular systolic function is normal. Left Atrium: Left atrial size was not well visualized. Right Atrium: Right atrial size was normal in size. Pericardium: A small pericardial effusion is present. There is no evidence of cardiac tamponade. Mitral Valve: The mitral valve is degenerative in appearance. Mild mitral valve regurgitation. Tricuspid Valve: The tricuspid valve is not well visualized. Tricuspid valve regurgitation is mild. Aortic Valve: The aortic valve was not well visualized. Aortic valve regurgitation is not visualized. Pulmonic Valve: The pulmonic valve was not well visualized. Pulmonic valve regurgitation is not visualized. Aorta: The aortic root and ascending aorta are structurally normal, with no evidence of dilitation.  Venous: The inferior vena cava not visualized. IAS/Shunts: No atrial level shunt detected by color flow Doppler.  LEFT VENTRICLE PLAX 2D LVIDd:         3.40 cm     Diastology LVIDs:         2.40 cm     LV e' medial:    5.25 cm/s LV PW:         0.90 cm     LV E/e' medial:  16.3 LV IVS:        0.90 cm     LV e' lateral:   6.68 cm/s LVOT diam:     2.00 cm     LV E/e' lateral: 12.8 LV SV:         48 LV SV Index:   30 LVOT Area:     3.14 cm  LV Volumes (MOD) LV vol d, MOD A2C: 46.1 ml LV vol d, MOD A4C: 70.7 ml LV vol s, MOD A2C: 19.5 ml LV vol s, MOD A4C: 33.5 ml LV SV MOD A2C:     26.6 ml LV SV MOD A4C:     70.7 ml LV SV MOD BP:      30.7 ml RIGHT VENTRICLE RV S prime:     10.50 cm/s TAPSE (M-mode): 1.8 cm LEFT ATRIUM             Index        RIGHT ATRIUM           Index LA diam:        2.40 cm 1.51 cm/m   RA Area:     16.20 cm LA Vol (A2C):   29.7 ml 18.65 ml/m  RA Volume:   39.50 ml  24.81 ml/m LA Vol (A4C):   25.6 ml 16.08 ml/m LA Biplane Vol: 29.7 ml 18.65 ml/m  AORTIC VALVE LVOT Vmax:   69.80 cm/s LVOT Vmean:  47.200 cm/s LVOT VTI:    0.154 m  AORTA Ao Root diam: 3.00 cm Ao Asc diam:  3.00 cm MITRAL VALVE               TRICUSPID  VALVE MV Area (PHT): 3.53 cm    TR Peak grad:   30.0 mmHg MV Decel Time: 215 msec    TR Vmax:        274.00 cm/s MV E velocity: 85.40 cm/s MV A velocity: 95.60 cm/s  SHUNTS MV E/A ratio:  0.89        Systemic VTI:  0.15 m                            Systemic Diam: 2.00 cm Phineas Inches Electronically signed by Phineas Inches Signature Date/Time: 06/10/2021/11:35:34 AM    Final     Cardiac Studies:  ECG: SR PR 218 msec no acute ST changes    Telemetry:  NSR 06/12/2021   Echo: EF 60-65%   Medications:    amLODipine  5 mg Oral Daily   aspirin  81 mg Oral Daily   atorvastatin  80 mg Oral q1800   influenza vaccine adjuvanted  0.5 mL Intramuscular Tomorrow-1000   isosorbide mononitrate  15 mg Oral Daily   metoprolol tartrate  12.5 mg Oral BID   pantoprazole  40 mg Oral Daily    sodium chloride flush  3 mL Intravenous Q12H   sodium chloride flush  3 mL Intravenous Q12H      sodium chloride 10 mL/hr at 06/12/21 0915   nitroGLYCERIN 10 mcg/min (06/12/21 0920)    Assessment/Plan:   SEMI:  troponin 1704 no acute ECG changes cath 06/10/21 shows 80% ostial LM and 95% D1. Given age not a candidate for CABG or high risk Impella supported intervention  Continue ASA/nitrates beta blocker and statin Cr up slightly post contrast but not on diuretic or nephrotoxic drugs. He lives alone but [place]. D/c home today Outpatient f/u Gwenlyn Found can recheck BMET then   Jenkins Rouge 06/12/2021, 9:47 AM

## 2021-06-13 ENCOUNTER — Encounter (HOSPITAL_COMMUNITY): Payer: Self-pay | Admitting: Cardiovascular Disease

## 2021-06-13 MED FILL — Nitroglycerin IV Soln 100 MCG/ML in D5W: INTRA_ARTERIAL | Qty: 10 | Status: AC

## 2021-06-14 ENCOUNTER — Telehealth: Payer: Self-pay | Admitting: *Deleted

## 2021-06-14 NOTE — Telephone Encounter (Signed)
Due to hearing problems on the phone, daughter-in-law Naven Giambalvo contacted to answer questions  Patient contacted regarding discharge from [facility] on 06/12/2021.  Patient understands to follow up with Dr. Anne Fu on 06/30/2021 at 4:30 pm at [facility] Office. Patient understands his discharge instructions. Patient understands his medications and regimen. Patient understands to bring all medications to this visit.  No problems or concerns at this time No c/o dizziness, chest pain or sob.

## 2021-06-14 NOTE — Telephone Encounter (Signed)
Contacted for TCM but couldn't hear well and request that daughter Victorino Dike is contacted.

## 2021-06-14 NOTE — Telephone Encounter (Signed)
-----   Message from Darreld Mclean, [place] sent at 06/12/2021 11:31 AM EST ----- Regarding: Hospital Follow -Up Patient was discharged today after being admitted for NSTEMI. He was found to have severe left main disease but this was treated medically due to his advanced age. He needs a TOC visit within 1-2 weeks (ideally closer to the 1 week mark). Patient lives in [place] so they would prefer this be in our [place] or [place] office but I did notify daughter-in-law that this may not be possible. Can you please call patient/family and help arrange this? If we are unable to get him in in [place] or [place] within 1-2 weeks, then we can schedule in any of the [place] offices.  Thank you so much! Callie

## 2021-06-15 ENCOUNTER — Ambulatory Visit: Payer: Medicare Other | Admitting: Cardiology

## 2021-06-23 ENCOUNTER — Emergency Department (HOSPITAL_COMMUNITY): Payer: Medicare Other

## 2021-06-23 ENCOUNTER — Emergency Department (HOSPITAL_COMMUNITY)
Admission: EM | Admit: 2021-06-23 | Discharge: 2021-06-23 | Disposition: A | Discharge status: Home / Self Care | Payer: Medicare Other | Attending: Emergency Medicine | Admitting: Emergency Medicine

## 2021-06-23 ENCOUNTER — Other Ambulatory Visit: Payer: Self-pay

## 2021-06-23 ENCOUNTER — Telehealth: Payer: Self-pay | Admitting: Cardiology

## 2021-06-23 ENCOUNTER — Encounter (HOSPITAL_COMMUNITY): Payer: Self-pay | Admitting: Emergency Medicine

## 2021-06-23 DIAGNOSIS — I2510 Atherosclerotic heart disease of native coronary artery without angina pectoris: Secondary | ICD-10-CM | POA: Diagnosis not present

## 2021-06-23 DIAGNOSIS — R079 Chest pain, unspecified: Secondary | ICD-10-CM | POA: Diagnosis not present

## 2021-06-23 DIAGNOSIS — I10 Essential (primary) hypertension: Secondary | ICD-10-CM | POA: Insufficient documentation

## 2021-06-23 DIAGNOSIS — I214 Non-ST elevation (NSTEMI) myocardial infarction: Secondary | ICD-10-CM | POA: Diagnosis not present

## 2021-06-23 DIAGNOSIS — R0789 Other chest pain: Secondary | ICD-10-CM | POA: Diagnosis not present

## 2021-06-23 DIAGNOSIS — R6889 Other general symptoms and signs: Secondary | ICD-10-CM | POA: Diagnosis not present

## 2021-06-23 DIAGNOSIS — Z79899 Other long term (current) drug therapy: Secondary | ICD-10-CM | POA: Diagnosis not present

## 2021-06-23 DIAGNOSIS — Z743 Need for continuous supervision: Secondary | ICD-10-CM | POA: Diagnosis not present

## 2021-06-23 DIAGNOSIS — I499 Cardiac arrhythmia, unspecified: Secondary | ICD-10-CM | POA: Diagnosis not present

## 2021-06-23 DIAGNOSIS — Z7982 Long term (current) use of aspirin: Secondary | ICD-10-CM | POA: Diagnosis not present

## 2021-06-23 LAB — CBC
HCT: 36.3 % — ABNORMAL LOW (ref 39.0–52.0)
Hemoglobin: 11.9 g/dL — ABNORMAL LOW (ref 13.0–17.0)
MCH: 30.7 pg (ref 26.0–34.0)
MCHC: 32.8 g/dL (ref 30.0–36.0)
MCV: 93.6 fL (ref 80.0–100.0)
Platelets: 230 10*3/uL (ref 150–400)
RBC: 3.88 MIL/uL — ABNORMAL LOW (ref 4.22–5.81)
RDW: 13.2 % (ref 11.5–15.5)
WBC: 9 10*3/uL (ref 4.0–10.5)
nRBC: 0 % (ref 0.0–0.2)

## 2021-06-23 LAB — BASIC METABOLIC PANEL
Anion gap: 9 (ref 5–15)
BUN: 24 mg/dL — ABNORMAL HIGH (ref 8–23)
CO2: 19 mmol/L — ABNORMAL LOW (ref 22–32)
Calcium: 8.6 mg/dL — ABNORMAL LOW (ref 8.9–10.3)
Chloride: 106 mmol/L (ref 98–111)
Creatinine, Ser: 1.6 mg/dL — ABNORMAL HIGH (ref 0.61–1.24)
GFR, Estimated: 39 mL/min — ABNORMAL LOW (ref >60)
Glucose, Bld: 98 mg/dL (ref 70–99)
Potassium: 4.5 mmol/L (ref 3.5–5.1)
Sodium: 134 mmol/L — ABNORMAL LOW (ref 135–145)

## 2021-06-23 LAB — TROPONIN I (HIGH SENSITIVITY): Troponin I (High Sensitivity): 187 ng/L (ref <18)

## 2021-06-23 MED ORDER — ASPIRIN 81 MG PO CHEW
324.0000 mg | CHEWABLE_TABLET | Freq: Once | ORAL | Status: DC
  Filled 2021-06-23: qty 4

## 2021-06-23 NOTE — Telephone Encounter (Signed)
Tareq, Dwan - 06/23/2021  3:41 PM Jake Bathe, MD  Sent: Thu June 23, 2021  4:33 PM  To: Loa Socks, LPN  Cc: Sharin Grave, RN          Message  Thank you for the update

## 2021-06-23 NOTE — ED Provider Notes (Signed)
[facility] Provider Note   CSN: [id number] Arrival date & time: 06/23/21  1710     History  Chief Complaint  Patient presents with   Chest Pain    Wilburn MylarBilly J Drury is a 88 y.o. male.   Chest Pain Associated symptoms: no fever and no shortness of breath    HPI: A 88 year old patient with a history of hypertension and hypercholesterolemia presents for evaluation of chest pain. Initial onset of pain was approximately 3-6 hours ago. The patient's chest pain is described as heaviness/pressure/tightness, is not worse with exertion and is relieved by nitroglycerin. The patient's chest pain is middle- or left-sided, is not well-localized, is not sharp and does radiate to the arms/jaw/neck. The patient does not complain of nausea and denies diaphoresis. The patient has no history of stroke, has no history of peripheral artery disease, has not smoked in the past 90 days, denies any history of treated diabetes, has no relevant family history of coronary artery disease (first degree relative at less than age 86) and does not have an elevated BMI (>=30).  Patient has history of hypertension acid reflux non-ST elevation MI, hyperlipidemia gastroesophageal reflux disease hypertension chronic kidney disease.  Most notably, patient was admitted to the hospital on January 20 and discharged on January 22.  Patient was admitted to the hospital for non-ST elevation MI at that time.  Patient did have a cardiac catheterization that shows 80% ostial left main disease and 95% stenosis of the first diagonal 50% stenosis of ostial right CA and 50% stenosis RPDA.  Patient was not felt to be a candidate for coronary artery bypass grafting considering his advanced age.  Recommendations were for medical management.  Patient called the cardiologist today after he experienced his chest pain.  He was instructed to come to the ER for further evaluation.  Right now the patient states he feels  fine and would prefer to go home if possible.  Patient's son is here with him and confirms the history the patient provided Home Medications Prior to Admission medications   Medication Sig Start Date End Date Taking? Authorizing Provider  amLODipine (NORVASC) 5 MG tablet Take 1 tablet (5 mg total) by mouth daily. 03/12/20   Rhetta MuraSamtani, Jai-Gurmukh, MD  aspirin 81 MG chewable tablet Chew 1 tablet (81 mg total) by mouth daily. 06/13/21   Corrin ParkerGoodrich, Callie E, PA-C  atorvastatin (LIPITOR) 80 MG tablet Take 1 tablet (80 mg total) by mouth daily at 6 PM. 06/12/21   Corrin ParkerGoodrich, Callie E, PA-C  isosorbide mononitrate (IMDUR) 30 MG 24 hr tablet Take 0.5 tablets (15 mg total) by mouth daily. 06/13/21   Corrin ParkerGoodrich, Callie E, PA-C  linaclotide [facility](LINZESS) 290 MCG CAPS capsule Take 1 capsule (290 mcg total) by mouth daily before breakfast. 10/11/20   Tiffany KocherLewis, Leslie S, PA-C  metoprolol tartrate (LOPRESSOR) 25 MG tablet Take 0.5 tablets (12.5 mg total) by mouth 2 (two) times daily. 06/12/21   Corrin ParkerGoodrich, Callie E, PA-C  Multiple Vitamins-Minerals (PRESERVISION AREDS PO) Take 1 capsule by mouth in the morning and at bedtime.    [provider]  nitroGLYCERIN (NITROSTAT) 0.4 MG SL tablet Place 1 tablet (0.4 mg total) under the tongue every 5 (five) minutes x 3 doses as needed for chest pain. 06/12/21   Corrin ParkerGoodrich, Callie E, PA-C  omeprazole (PRILOSEC) 20 MG capsule Take 1 capsule (20 mg total) by mouth 2 (two) times daily before a meal. 04/30/20   Tiffany KocherLewis, Leslie S, PA-C  polyethylene glycol (  MIRALAX / GLYCOLAX) 17 g packet Take 17 g by mouth 2 (two) times daily. Patient taking differently: Take 34 g by mouth in the morning. 2 capfuls 03/11/20   Rhetta Mura, MD      Allergies    Patient has no known allergies.    Review of Systems   Review of Systems  Constitutional:  Negative for fever.  Respiratory:  Negative for shortness of breath.   Cardiovascular:  Positive for chest pain.   Physical Exam Updated Vital  Signs BP (!) 125/58    Pulse 72    Resp 17    Ht 1.651 m (5\' 5" )    Wt 55.3 kg    SpO2 100%    BMI 20.30 kg/m  Physical Exam Vitals and nursing note reviewed.  Constitutional:      Appearance: He is well-developed. He is not diaphoretic.  HENT:     Head: Normocephalic and atraumatic.     Right Ear: External ear normal.     Left Ear: External ear normal.  Eyes:     General: No scleral icterus.       Right eye: No discharge.        Left eye: No discharge.     Conjunctiva/sclera: Conjunctivae normal.  Neck:     Trachea: No tracheal deviation.  Cardiovascular:     Rate and Rhythm: Normal rate and regular rhythm.  Pulmonary:     Effort: Pulmonary effort is normal. No respiratory distress.     Breath sounds: Normal breath sounds. No stridor. No wheezing or rales.  Abdominal:     General: Bowel sounds are normal. There is no distension.     Palpations: Abdomen is soft.     Tenderness: There is no abdominal tenderness. There is no guarding or rebound.  Musculoskeletal:        General: No tenderness or deformity.     Cervical back: Neck supple.  Skin:    General: Skin is warm and dry.     Findings: No rash.  Neurological:     General: No focal deficit present.     Mental Status: He is alert.     Cranial Nerves: No cranial nerve deficit (no facial droop, extraocular movements intact, no slurred speech).     Sensory: No sensory deficit.     Motor: No abnormal muscle tone or seizure activity.     Coordination: Coordination normal.  Psychiatric:        Mood and Affect: Mood normal.    ED Results / Procedures / Treatments   Labs (all labs ordered are listed, but only abnormal results are displayed) Labs Reviewed  BASIC METABOLIC PANEL - Abnormal; Notable for the following components:      Result Value   Sodium 134 (*)    CO2 19 (*)    BUN 24 (*)    Creatinine, Ser 1.60 (*)    Calcium 8.6 (*)    GFR, Estimated 39 (*)    All other components within normal limits  CBC -  Abnormal; Notable for the following components:   RBC 3.88 (*)    Hemoglobin 11.9 (*)    HCT 36.3 (*)    All other components within normal limits  TROPONIN I (HIGH SENSITIVITY) - Abnormal; Notable for the following components:   Troponin I (High Sensitivity) 187 (*)    All other components within normal limits  TROPONIN I (HIGH SENSITIVITY)    EKG EKG Interpretation  Date/Time:  Thursday June 23 2021  17:10:50 EST Ventricular Rate:  83 PR Interval:  191 QRS Duration: 88 QT Interval:  361 QTC Calculation: 425 R Axis:   -15 Text Interpretation: Sinus rhythm Atrial premature complexes Borderline left axis deviation Borderline low voltage, extremity leads Probable anteroseptal infarct, old No significant change since last tracing Confirmed by Linwood Dibbles [phone]) on 06/23/2021 5:35:28 PM  Radiology DG Chest Portable 1 View  Result Date: 06/23/2021 CLINICAL DATA:  Central chest pain starting around 2pm radiating to the RT arm; Heart attack 2 weeks ago - EXAM: PORTABLE CHEST 1 VIEW COMPARISON:  Chest x-ray 10/03/2017 FINDINGS: The heart and mediastinal contours are unchanged. Aortic calcification. No focal consolidation. Chronic coarsened markings with no overt pulmonary edema. Nonspecific blunting of the costophrenic angles with no definite pleural effusion. No pneumothorax. No acute osseous abnormality. IMPRESSION: 1. No active disease. 2.  Aortic Atherosclerosis (ICD10-I70.0). Electronically Signed   By: Tish Frederickson M.D.   On: 06/23/2021 19:06    Procedures Procedures    Medications Ordered in ED Medications  aspirin chewable tablet 324 mg (324 mg Oral Not Given 06/23/21 1914)    ED Course/ Medical Decision Making/ A&P Clinical Course as of 06/23/21 2203  Thu Jun 23, 2021  2136 Troponin I (High Sensitivity)(!!) Troponin elevated at 187 [JK]  2136 CBC(!) CBC shows stable hemoglobin [JK]  2137 Basic metabolic panel(!) Metabolic panel shows chronic kidney disease stable  compared to previous values [JK]    Clinical Course User Index [JK] Linwood Dibbles, MD   HEAR Score: 6                       Medical Decision Making Amount and/or Complexity of Data Reviewed Labs: ordered. Decision-making details documented in ED Course. Radiology: ordered.  Risk OTC drugs.   Chest pain/non-STEMI Patient presents the ED with recurrent chest pain.  He was recently in the hospital last month with non-ST elevation MI.  Patient had a cardiac catheterization that showed extensive coronary artery disease that would require coronary artery bypass grafting but the patient is not a candidate due to his advanced age.  Patient presented with similar symptoms today although patient states not as severe.  His troponin is elevated again today and it appears the patient is having another NSTEMI.  I discussed these findings with both the patient and his daughter.  Patient understands that he is having another heart attack.  He states he feels fine though.  He understands he could die from a heart attack but he would rather be at home.  He does understand that he is not a candidate for any further cardiac interventions and because he feels fine right now would like to be at home.  Patient has a good understanding of his illness.  His family supports him in his decision.  Patient understands he can return at any time.        Final Clinical Impression(s) / ED Diagnoses Final diagnoses:  NSTEMI (non-ST elevated myocardial infarction) [facility])    Rx / DC Orders ED Discharge Orders     None         Linwood Dibbles, MD 06/23/21 2203

## 2021-06-23 NOTE — ED Triage Notes (Signed)
BIB [facility]. Central CP starting at 1400 while washing dishes after eating. Also report R arm pain. HX MI 2 weeks ago. Given Nitro x3 and ASA 324 by EMS. CP resolved upon arrival to ED. Aox4.

## 2021-06-23 NOTE — ED Notes (Signed)
Patient discharge instructions reviewed with the patient. The patient verbalized understanding of instructions. Patient discharged. 

## 2021-06-23 NOTE — Discharge Instructions (Signed)
Continue your current medications.  Return to the emergency room if you have any recurrent symptoms or if you change your mind about being admitted to the hospital.

## 2021-06-23 NOTE — Telephone Encounter (Signed)
Pt c/o of Chest Pain: STAT if CP now or developed within 24 hours  1. Are you having CP right now? yes  2. Are you experiencing any other symptoms (ex. SOB, nausea, vomiting, sweating)? dizziness  3. How long have you been experiencing CP? today  4. Is your CP continuous or coming and going? Coming and going   5. Have you taken Nitroglycerin? 3 last 15 min  ?

## 2021-06-23 NOTE — Telephone Encounter (Signed)
Pts daughter in law (with the pt right now in the home) is calling to inform us that the pt called her about 2:45 pm to report he is having active chest pain and sob, and took 3 nitroglycerin's with no relief.   She states he has taken all 3 nitro's within the last 15 mins, with no relief of chest pain and sob.   Daughter in law is seeking further advisement about this, for the pt is refusing her recommendation for him to go to the ER, until he hears from our office. Advised the daughter-in-law and the pt who was on speaker phone, that they need to call 911 now, to have the pt transported to the ER now.  Advised her to NOT allow the pt to administer anymore nitroglycerins and to make sure she tells EMS that 3 nitro's have been administered with no relief of chest pain.   Pt was recently discharged from the hospital for NSTEMI, high sensitivity troponins. Pt already had a cath done showing  80% ostial left main disease as well as 95% stenosis of first diagonal, 50% stenosis of ostial RCA, and 50% stenosis of RPDA. Given his age he was deemed not a candidate for CABG or high risk intervention, therefore the decision was to treat him medically.   Informed the pts daughter that given his extensive cardiac history, active complaints of chest pain with 3 nitro's on board, the safest thing for the pt at this time is to proceed to the ER via EMS.  Advised her to call now.   Daughter in law verbalized understanding and agrees with this plan.  She will call 911 now.  She is aware that I will route this message to Dr. Marlou Porch and RN to make them aware of this plan, upon return to the office.  Did inform her that we will keep his appt scheduled with Dr. Marlou Porch on 2/9, and the hospital will advise if this needs to be rescheduled to a different day or not,  based on his hospital stay and discharge.  Daughter in law agreed to this plan as well.            NSTEMI Patient admitted with NSTEMI as stated above.   High-sensitivity troponin peaked at 1,704.  Echo showed LVEF of 60 to 65% with no regional wall motion abnormalities, grade 1 diastolic dysfunction, and mild MR.  Already a catheterization showed 80% ostial left main disease as well as 95% stenosis of first diagonal, 50% stenosis of ostial RCA, and 50% stenosis of RPDA.  Given advanced age, he is not a candidate for CABG or high risk Impella supported intervention.  Therefore, decision was made to treat medically.  IV heparin was continued for 48 hours. He was started on Aspirin 81mg  daily, Imdur 15mg  daily, Lopressor 12.5mg  twice daily, and Lipitor 80mg  daily and home Amlodipine was continued. He did well with this with no recurrent chest pain and is felt to be stable for discharge. Of note, he was not started on Plavix due to his age and the fact that no PCI was performed.   Hypertension

## 2021-06-24 NOTE — Telephone Encounter (Signed)
Noted! Thank you

## 2021-06-29 NOTE — Progress Notes (Signed)
Cardiology Office Note:    Date:  06/30/2021   ID:  Hector Lowe, DOB [birth year], MRN [id number]  PCP:  Caryl Bis, MD   [facility] Providers Cardiologist:  Candee Furbish, MD     Referring MD: Caryl Bis, MD   History of Present Illness:    Hector Lowe is a 88 y.o. male here for evaluation following hospital admittance for NSTEMI.  Hector. Nou was admitted to the hospital 06/10/2021 for NSTEMI. He had a left heart catheterization showing 80% ostial left main disease as well as 95% stenosis of first diagonal, 50% stenosis of ostial RCA, and 50% stenosis of RPDA. Given his age he was deemed not a candidate for CABG or high risk intervention, so medical treatment was recommended. His daughter-in-law called our office 06/23/2021 and reported he was having active chest pain and shortness of breath with no relief after taking 3 nitroglycerin. She was advised to call EMS. He then presented to the ER, and stated the pain was not as severe as prior. He preferred to be discharged home as he was feeling better.  Today: Overall, he is feeling pretty good.  After he walks for about 2-3 minutes, he becomes very short of breath. He typically feels fine while sitting.  He denies any palpitations, chest pain. No lightheadedness, headaches, syncope, orthopnea, PND, or lower extremity edema.   Past Medical History:  Diagnosis Date   Acid reflux    CAD (coronary artery disease) 06/12/2021   CKD (chronic kidney disease) stage 3, GFR 30-59 ml/min (HCC) 03/08/2020   Constipation 03/08/2020   Essential hypertension 03/08/2020   GERD (gastroesophageal reflux disease) 03/08/2020   Gout flare 03/09/2020   Hyperlipidemia LDL goal <70 06/12/2021   Hypertension    Hyponatremia 03/08/2020   Ileus ([place]) 03/07/2020   Lower abdominal pain 09/01/2020   Mild AKI 06/12/2021   Nicotine dependence, chewing tobacco, w oth disorders 03/08/2020   NSTEMI (non-ST elevated myocardial infarction) ([place])  06/10/2021    Past Surgical History:  Procedure Laterality Date   EYE SURGERY     HERNIA REPAIR     LEFT HEART CATH AND CORONARY ANGIOGRAPHY N/A 06/10/2021   Procedure: LEFT HEART CATH AND CORONARY ANGIOGRAPHY;  Surgeon: Lorretta Harp, MD;  Location: [facility] [facility];  Service: Cardiovascular;  Laterality: N/A;    Current Medications: Current Meds  Medication Sig   amLODipine (NORVASC) 5 MG tablet Take 1 tablet (5 mg total) by mouth daily.   aspirin 81 MG chewable tablet Chew 1 tablet (81 mg total) by mouth daily.   atorvastatin (LIPITOR) 80 MG tablet Take 1 tablet (80 mg total) by mouth daily at 6 PM.   linaclotide (LINZESS) 290 MCG CAPS capsule Take 1 capsule (290 mcg total) by mouth daily before breakfast.   metoprolol tartrate (LOPRESSOR) 25 MG tablet Take 0.5 tablets (12.5 mg total) by mouth 2 (two) times daily.   Multiple Vitamins-Minerals (PRESERVISION AREDS PO) Take 1 capsule by mouth in the morning and at bedtime.   nitroGLYCERIN (NITROSTAT) 0.4 MG SL tablet Place 1 tablet (0.4 mg total) under the tongue every 5 (five) minutes x 3 doses as needed for chest pain.   omeprazole (PRILOSEC) 20 MG capsule Take 1 capsule (20 mg total) by mouth 2 (two) times daily before a meal.   polyethylene glycol (MIRALAX / GLYCOLAX) 17 g packet Take 17 g by mouth 2 (two) times daily. (Patient taking differently: Take 34 g by mouth in the morning. 2  capfuls)   [DISCONTINUED] isosorbide mononitrate (IMDUR) 30 MG 24 hr tablet Take 0.5 tablets (15 mg total) by mouth daily.     Allergies:   Patient has no known allergies.   Social History   Socioeconomic History   Marital status: Widowed    Spouse name: Not on file   Number of children: Not on file   Years of education: Not on file   Highest education level: Not on file  Occupational History   Not on file  Tobacco Use   Smoking status: Never   Smokeless tobacco: Current    Types: Chew  Vaping Use   Vaping Use: Never used  Substance  and Sexual Activity   Alcohol use: No   Drug use: No   Sexual activity: Not on file  Other Topics Concern   Not on file  Social History Narrative   Not on file   Social Determinants of Health   Financial Resource Strain: Not on file  Food Insecurity: Not on file  Transportation Needs: Not on file  Physical Activity: Not on file  Stress: Not on file  Social Connections: Not on file     Family History: The patient's family history is negative for Colon cancer.  ROS:   Please see the history of present illness.    (+) Shortness of breath All other systems reviewed and are negative.  EKGs/Labs/Other Studies Reviewed:    The following studies were reviewed today:  Left Heart Cath 06/10/2021:   Ost LM lesion is 80% stenosed.   1st Diag lesion is 95% stenosed.   Ost RCA lesion is 50% stenosed.   RPDA lesion is 50% stenosed.  IMPRESSION:Hector Lowe has high-grade ostial/proximal left main disease, high-grade proximal first diagonal branch disease and moderate ostial large dominant RCA disease that does not look significant.  He is not a CABG candidate.  At this point, short of performing left main intervention, I recommend antianginal therapy.  The sheath was removed and a TR band was placed on the right wrist to achieve patent hemostasis.  The patient left the lab in stable condition.  I am going to start IV heparin 4 hours after sheath removal and continue heparin for 48 hours.  We will begin him on dual antiplatelet therapy.  I had a long discussion with the patient's son and daughter-in-law who agreed with the plan of medical therapy alone.  Suspect he will be ready for discharge either Sunday or Monday.  Diagnostic Dominance: Right   Echo 06/10/2021: Sonographer Comments: No subcostal window.  IMPRESSIONS     1. Left ventricular ejection fraction, by estimation, is 60 to 65%. The  left ventricle has normal function. The left ventricle has no regional  wall motion  abnormalities. Left ventricular diastolic parameters are  consistent with Grade I diastolic  dysfunction (impaired relaxation).   2. No subcostal views to visualize IVC for RVSP. Right ventricular  systolic function is normal. The right ventricular size is normal.   3. A small pericardial effusion is present. There is no evidence of  cardiac tamponade.   4. The mitral valve is degenerative. Mild mitral valve regurgitation.   5. The aortic valve was not well visualized. Aortic valve regurgitation  is not visualized.   6. The inferior vena cava not visualized.   Comparison(s): No prior Echocardiogram.   EKG:  EKG is personally reviewed and interpreted. 06/30/2021: EKG was not ordered. 06/23/2021 (ED): Sinus rhythm. Rate 83 bpm. Atrial premature complexes, Borderline left axis deviation,  Borderline low voltage, extremity leads, Probably anteroseptal infarct, old. No significant change since last tracing.  Recent Labs: 06/10/2021: ALT 17 06/23/2021: BUN 24; Creatinine, Ser 1.60; Hemoglobin 11.9; Platelets 230; Potassium 4.5; Sodium 134   Recent Lipid Panel    Component Value Date/Time   CHOL 149 06/11/2021 0220   TRIG 42 06/11/2021 0220   HDL 44 06/11/2021 0220   CHOLHDL 3.4 06/11/2021 0220   VLDL 8 06/11/2021 0220   LDLCALC 97 06/11/2021 0220     Risk Assessment/Calculations:          Physical Exam:    VS:  BP 120/60 (BP Location: Left Arm, Patient Position: Sitting, Cuff Size: Normal)    Pulse 67    Ht 5\' 5"  (1.651 m)    Wt 121 lb (54.9 kg)    SpO2 97%    BMI 20.14 kg/m     Wt Readings from Last 3 Encounters:  06/30/21 121 lb (54.9 kg)  06/23/21 122 lb (55.3 kg)  06/12/21 118 lb 12.8 oz (53.9 kg)     GEN: Well nourished, well developed in no acute distress HEENT: Normal NECK: No JVD; No carotid bruits LYMPHATICS: No lymphadenopathy CARDIAC: RRR, no murmurs, rubs, gallops RESPIRATORY:  Clear to auscultation without rales, wheezing or rhonchi  ABDOMEN: Soft, non-tender,  non-distended MUSCULOSKELETAL:  No edema; No deformity  SKIN: Warm and dry NEUROLOGIC:  Alert and oriented x 3 PSYCHIATRIC:  Normal affect   ASSESSMENT:    1. NSTEMI (non-ST elevated myocardial infarction) ([place])   2. DNR (do not resuscitate) discussion   3. Hyperlipidemia LDL goal <70    PLAN:    In order of problems listed above:  NSTEMI (non-ST elevated myocardial infarction) ([place]) 80% ostial left main, EF 65%.  Continue with optimize goal-directed medical therapy.  Not a candidate for surgical or PCI.  We will increase his isosorbide to 30 mg a day from 15 mg a day.  Hopefully this will help with some of his shortness of breath that he is feeling when he is exerting himself.  No significant chest pain currently.  He did have an episode of chest discomfort that resulted in an ER visit.  He ended up going home from the ER.  Chest pain subsided.   DNR (do not resuscitate) discussion We had discussion about DNR status.  Family member present.  Given his advanced age of 63, severe underlying coronary artery disease, 80% ostial left main, weight of 121 pounds, we discussed that if a fatal arrhythmia were to recur that we not proceed with shocks or CPR or intubation in this setting.  Obviously if other medical conditions occur such as pneumonia etc., we will continue to treat him to the best of our abilities.  However, CPR or other vigorous methods would be deleterious and have a very low likelihood of survival.  He understands and agrees.  We have provided him with DNR form.  Hyperlipidemia LDL goal <70 Continue with atorvastatin 80 mg high intensity for plaque stabilization as best as possible.       Follow-up: 6 months.  Medication Adjustments/Labs and Tests Ordered: Current medicines are reviewed at length with the patient today.  Concerns regarding medicines are outlined above.   No orders of the defined types were placed in this encounter.  Meds ordered this encounter   Medications   isosorbide mononitrate (IMDUR) 30 MG 24 hr tablet    Sig: Take 1 tablet (30 mg total) by mouth daily.    Dispense:  90 tablet    Refill:  3   Patient Instructions  Medication Instructions:  Please increase your Isosorbide 30 mg a day. Continue all other medications as listed.  *If you need a refill on your cardiac medications before your next appointment, please call your pharmacy*  Follow-Up: At [facility], you and your health needs are our priority.  As part of our continuing mission to provide you with exceptional heart care, we have created designated Provider Care Teams.  These Care Teams include your primary Cardiologist (physician) and Advanced Practice Providers (APPs -  Physician Assistants and Nurse Practitioners) who all work together to provide you with the care you need, when you need it.  We recommend signing up for the patient portal called "MyChart".  Sign up information is provided on this After Visit Summary.  MyChart is used to connect with patients for Virtual Visits (Telemedicine).  Patients are able to view lab/test results, encounter notes, upcoming appointments, etc.  Non-urgent messages can be sent to your provider as well.   To learn more about what you can do with MyChart, go to [email or web address].    Your next appointment:   6 month(s)  The format for your next appointment:   In Person  Provider:   Candee Furbish, MD {  Thank you for choosing [facility]!!      I,Mathew Stumpf,acting as a scribe for Candee Furbish, MD.,have documented all relevant documentation on the behalf of Candee Furbish, MD,as directed by  Candee Furbish, MD while in the presence of Candee Furbish, MD.  I, Candee Furbish, MD, have reviewed all documentation for this visit. The documentation on 06/30/21 for the exam, diagnosis, procedures, and orders are all accurate and complete.   Signed, Candee Furbish, MD  06/30/2021 5:16 PM    [place]

## 2021-06-30 ENCOUNTER — Other Ambulatory Visit: Payer: Self-pay

## 2021-06-30 ENCOUNTER — Ambulatory Visit (INDEPENDENT_AMBULATORY_CARE_PROVIDER_SITE_OTHER): Payer: Medicare Other | Admitting: Cardiology

## 2021-06-30 ENCOUNTER — Encounter: Payer: Self-pay | Admitting: Cardiology

## 2021-06-30 DIAGNOSIS — Z7189 Other specified counseling: Secondary | ICD-10-CM | POA: Diagnosis not present

## 2021-06-30 DIAGNOSIS — I214 Non-ST elevation (NSTEMI) myocardial infarction: Secondary | ICD-10-CM

## 2021-06-30 DIAGNOSIS — E785 Hyperlipidemia, unspecified: Secondary | ICD-10-CM

## 2021-06-30 MED ORDER — ISOSORBIDE MONONITRATE ER 30 MG PO TB24: 30.0000 mg | ORAL_TABLET | Freq: Every day | ORAL | 3 refills | Status: DC

## 2021-06-30 NOTE — Patient Instructions (Signed)
Medication Instructions:  Please increase your Isosorbide 30 mg a day. Continue all other medications as listed.  *If you need a refill on your cardiac medications before your next appointment, please call your pharmacy*  Follow-Up: At [facility], you and your health needs are our priority.  As part of our continuing mission to provide you with exceptional heart care, we have created designated Provider Care Teams.  These Care Teams include your primary Cardiologist (physician) and Advanced Practice Providers (APPs -  Physician Assistants and Nurse Practitioners) who all work together to provide you with the care you need, when you need it.  We recommend signing up for the patient portal called "MyChart".  Sign up information is provided on this After Visit Summary.  MyChart is used to connect with patients for Virtual Visits (Telemedicine).  Patients are able to view lab/test results, encounter notes, upcoming appointments, etc.  Non-urgent messages can be sent to your provider as well.   To learn more about what you can do with MyChart, go to [email or web address].    Your next appointment:   6 month(s)  The format for your next appointment:   In Person  Provider:   Donato Schultz, MD {  Thank you for choosing [facility]!!

## 2021-06-30 NOTE — Assessment & Plan Note (Signed)
We had discussion about DNR status.  Family member present.  Given his advanced age of 27, severe underlying coronary artery disease, 80% ostial left main, weight of 121 pounds, we discussed that if a fatal arrhythmia were to recur that we not proceed with shocks or CPR or intubation in this setting.  Obviously if other medical conditions occur such as pneumonia etc., we will continue to treat him to the best of our abilities.  However, CPR or other vigorous methods would be deleterious and have a very low likelihood of survival.  He understands and agrees.  We have provided him with DNR form.

## 2021-06-30 NOTE — Assessment & Plan Note (Signed)
Continue with atorvastatin 80 mg high intensity for plaque stabilization as best as possible.

## 2021-06-30 NOTE — Assessment & Plan Note (Signed)
80% ostial left main, EF 65%.  Continue with optimize goal-directed medical therapy.  Not a candidate for surgical or PCI.  We will increase his isosorbide to 30 mg a day from 15 mg a day.  Hopefully this will help with some of his shortness of breath that he is feeling when he is exerting himself.  No significant chest pain currently.  He did have an episode of chest discomfort that resulted in an ER visit.  He ended up going home from the ER.  Chest pain subsided.

## 2021-07-01 NOTE — Addendum Note (Signed)
Addended by: Jake Bathe on: 07/01/2021 06:19 AM   Modules accepted: Orders

## 2021-07-10 DIAGNOSIS — E7849 Other hyperlipidemia: Secondary | ICD-10-CM | POA: Diagnosis not present

## 2021-07-10 DIAGNOSIS — I10 Essential (primary) hypertension: Secondary | ICD-10-CM | POA: Diagnosis not present

## 2021-07-10 DIAGNOSIS — I214 Non-ST elevation (NSTEMI) myocardial infarction: Secondary | ICD-10-CM | POA: Diagnosis not present

## 2021-07-10 DIAGNOSIS — I25119 Atherosclerotic heart disease of native coronary artery with unspecified angina pectoris: Secondary | ICD-10-CM | POA: Diagnosis not present

## 2021-07-25 ENCOUNTER — Encounter: Payer: Self-pay | Admitting: Internal Medicine

## 2021-08-20 DIAGNOSIS — H40033 Anatomical narrow angle, bilateral: Secondary | ICD-10-CM | POA: Diagnosis not present

## 2021-08-20 DIAGNOSIS — H10013 Acute follicular conjunctivitis, bilateral: Secondary | ICD-10-CM | POA: Diagnosis not present

## 2021-08-24 ENCOUNTER — Ambulatory Visit: Payer: Medicare Other | Admitting: Internal Medicine

## 2021-08-31 ENCOUNTER — Other Ambulatory Visit: Payer: Self-pay | Admitting: Student

## 2021-09-01 DIAGNOSIS — N183 Chronic kidney disease, stage 3 unspecified: Secondary | ICD-10-CM | POA: Diagnosis not present

## 2021-09-01 DIAGNOSIS — K2100 Gastro-esophageal reflux disease with esophagitis, without bleeding: Secondary | ICD-10-CM | POA: Diagnosis not present

## 2021-09-01 DIAGNOSIS — I10 Essential (primary) hypertension: Secondary | ICD-10-CM | POA: Diagnosis not present

## 2021-09-01 DIAGNOSIS — Z1329 Encounter for screening for other suspected endocrine disorder: Secondary | ICD-10-CM | POA: Diagnosis not present

## 2021-09-15 IMAGING — CT CT ABD-PELV W/ CM
2 of 5 series · 16 of 46 positions shown, 18 images · IV contrast (Omnipaque or Isovue)
Comparison: Abdominal radiographs 03/07/2020. CT abdomen and pelvis
01/28/2020

CLINICAL DATA: Abdominal distention. Severe lower and mid abdominal
pain and cramping.

EXAM:
CT ABDOMEN AND PELVIS WITH CONTRAST
TECHNIQUE: Multidetector CT imaging of the abdomen and pelvis was performed
using the standard protocol following bolus administration of
intravenous contrast.
CONTRAST:  100mL OMNIPAQUE IOHEXOL 300 MG/ML  SOLN

[Series 2: axial st · axial · 0.61mm/px · z∈[+697,+1052]mm · 13 of 81 slices shown, 15 images]
[im 5/81  soft-tissue]
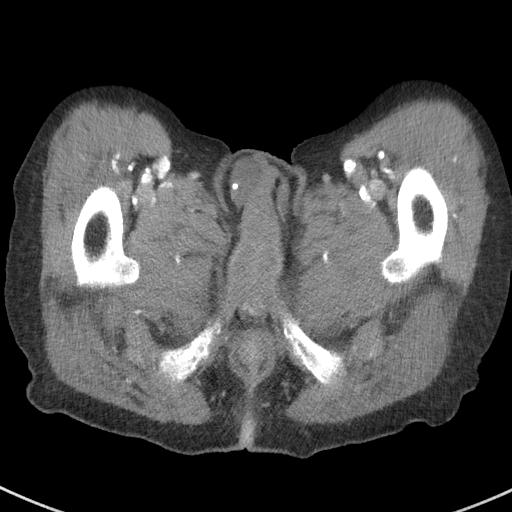
[im 5/81  bone]
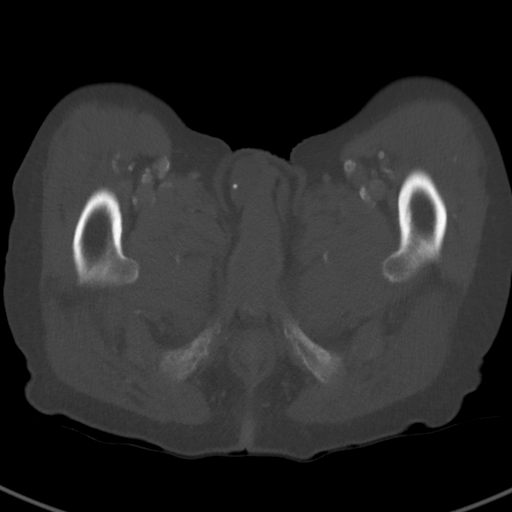
[im 13/81  soft-tissue]
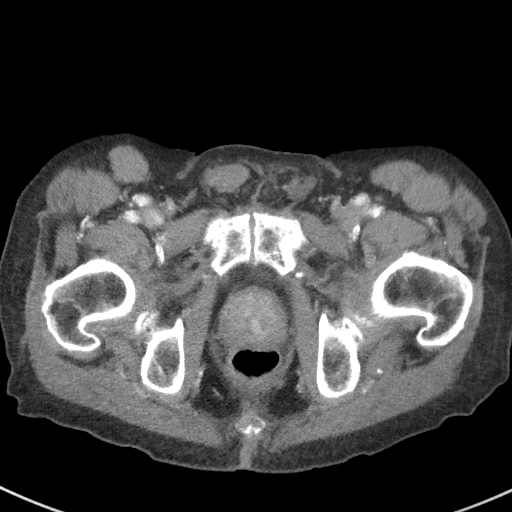
[im 17/81  soft-tissue]
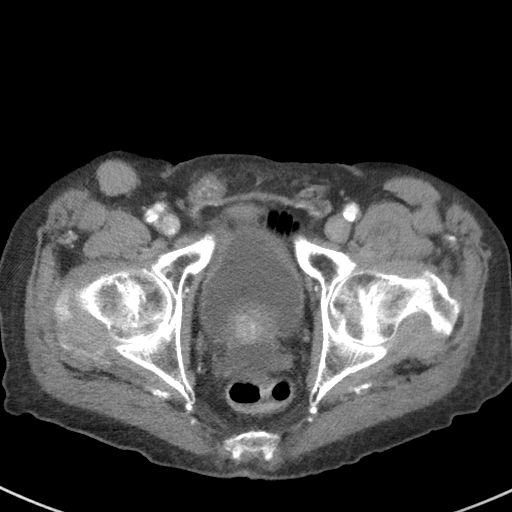
[im 22/81  soft-tissue]
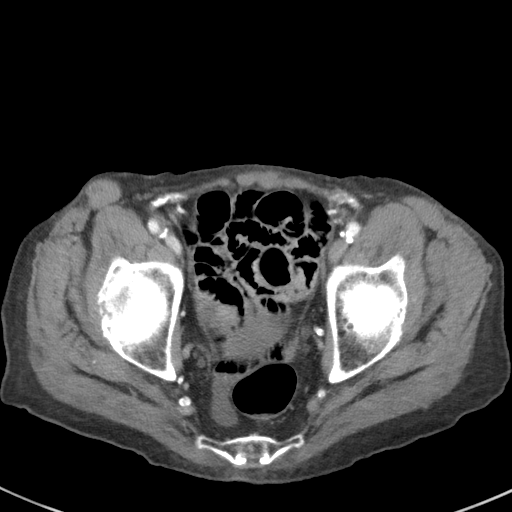
[im 30/81  soft-tissue]
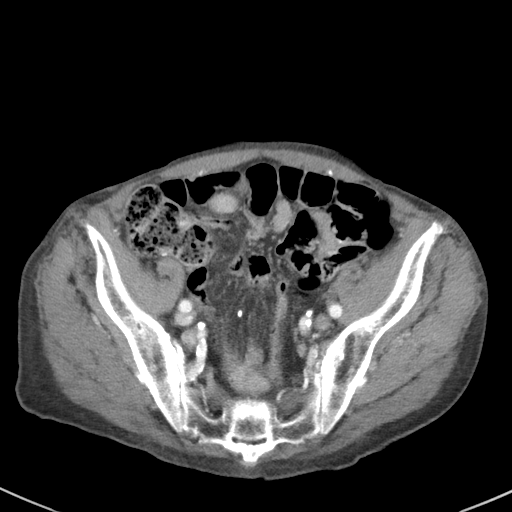
[im 34/81  soft-tissue]
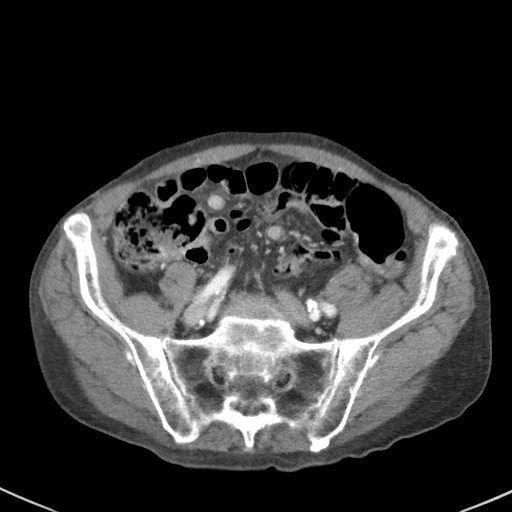
[im 43/81  soft-tissue]
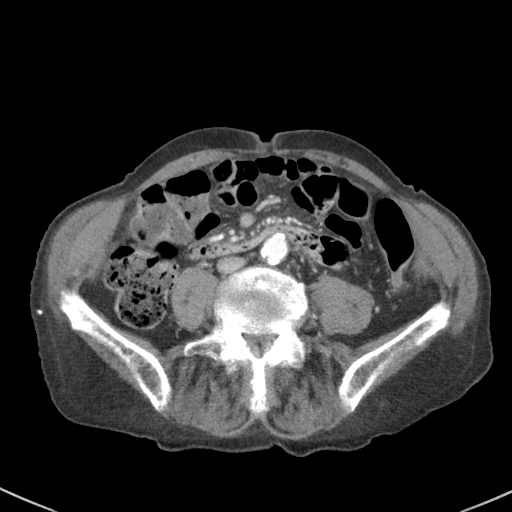
[im 47/81  soft-tissue]
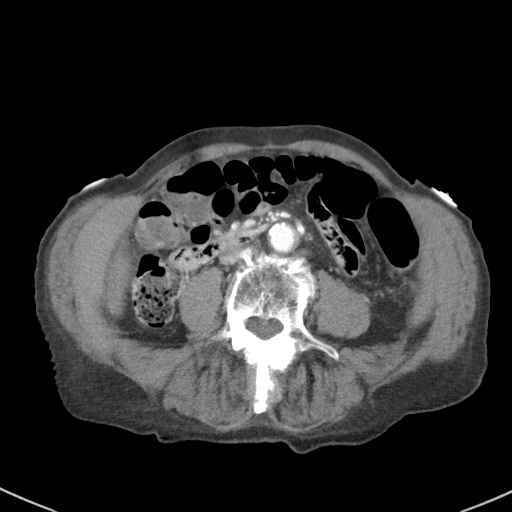
[im 51/81  soft-tissue]
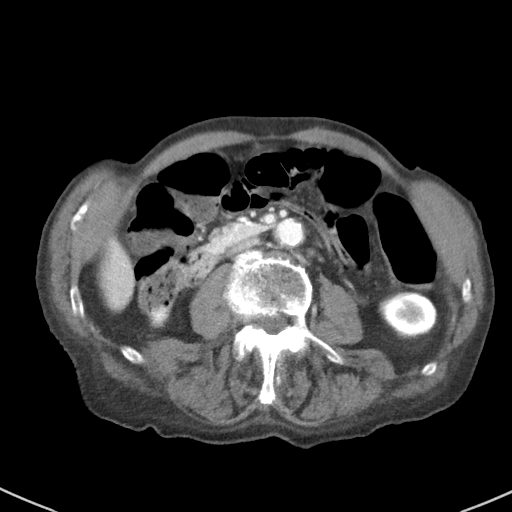
[im 51/81  bone]
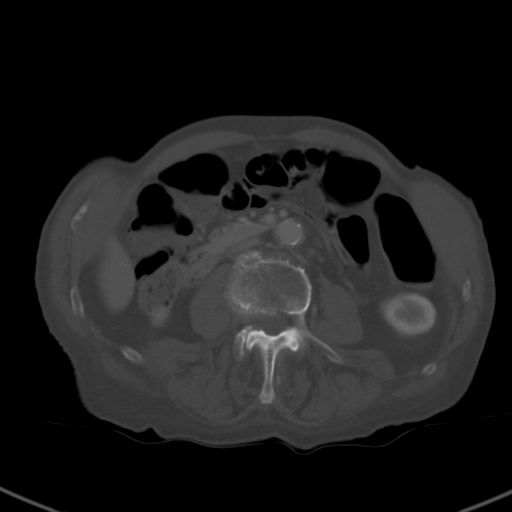
[im 59/81  soft-tissue]
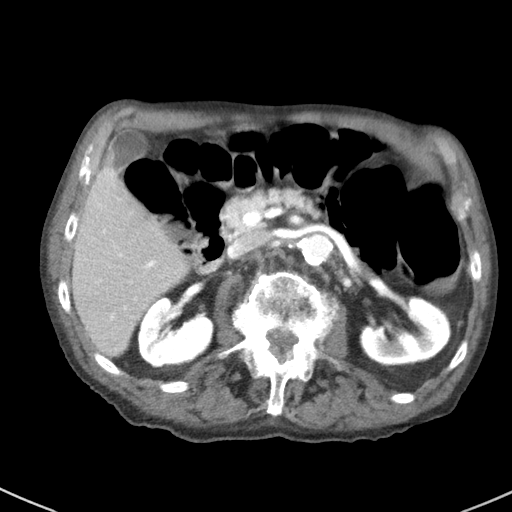
[im 64/81  soft-tissue]
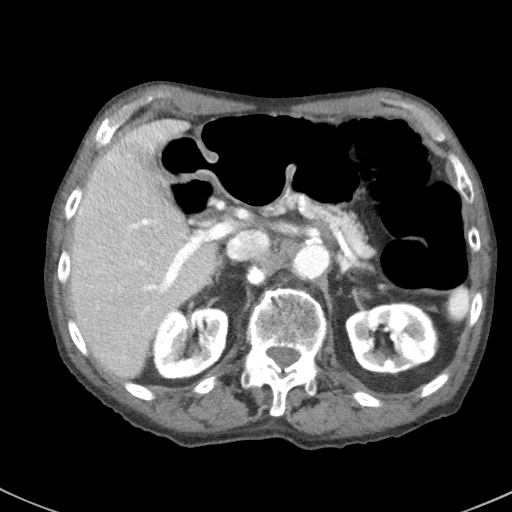
[im 68/81  soft-tissue]
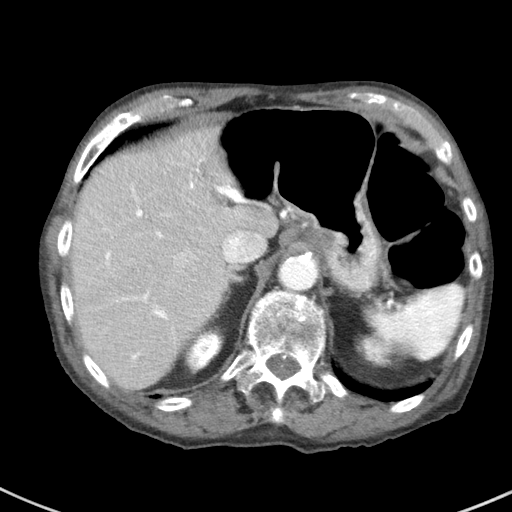
[im 76/81  soft-tissue]
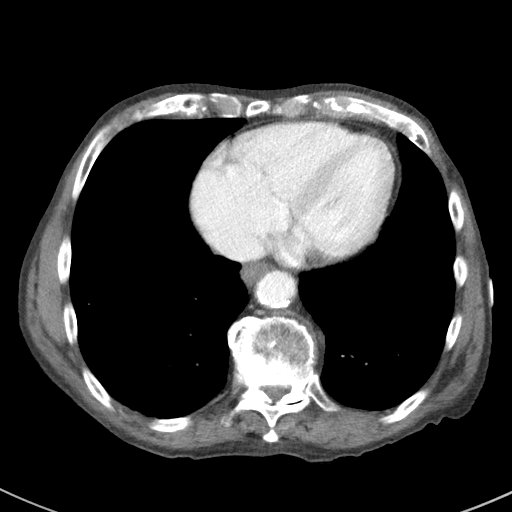

[Series 5: coronal st · coronal · 0.65mm/px · 3 of 86 slices shown]
[im 29/86  soft-tissue]
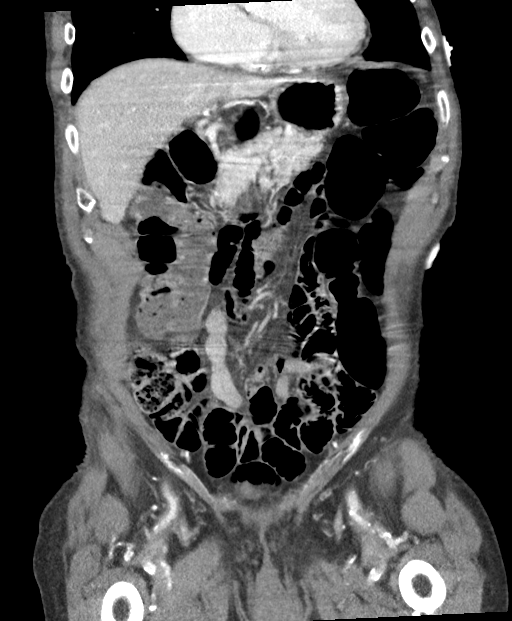
[im 38/86  soft-tissue]
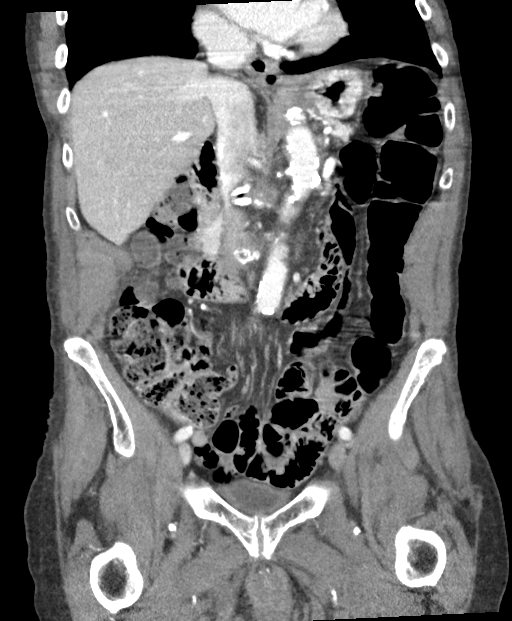
[im 48/86  soft-tissue]
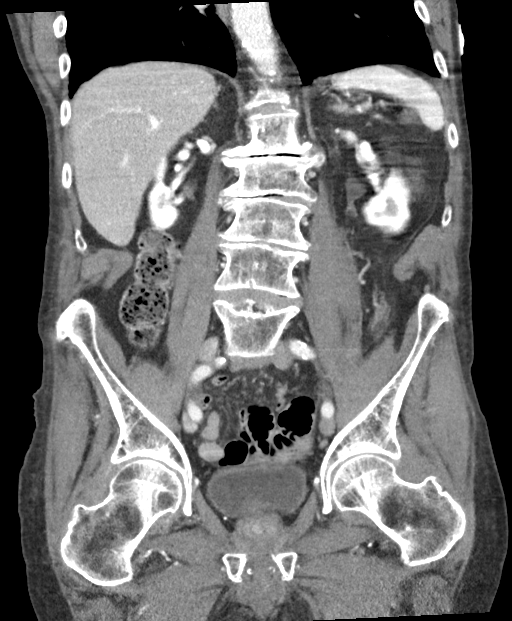

[16 of 46 positions shown; findings below may reference images not displayed]

FINDINGS: Lower chest: Mild peripheral interstitial changes, likely mild
fibrosis.

Hepatobiliary: No focal liver abnormality is seen. No gallstones,
gallbladder wall thickening, or biliary dilatation.

Pancreas: Unremarkable. No pancreatic ductal dilatation or
surrounding inflammatory changes.

Spleen: Normal in size without focal abnormality.

Adrenals/Urinary Tract: Adrenal glands are unremarkable. Kidneys are
normal, without renal calculi, focal lesion, or hydronephrosis.
Bladder is unremarkable.

Stomach/Bowel: Mild prominence of gas-filled colon without wall
thickening. Likely ileus. Stomach and small bowel are not abnormally
distended. Appendix is normal.

Vascular/Lymphatic: Aortic atherosclerosis. No enlarged abdominal or
pelvic lymph nodes.

Reproductive: Prostate gland is enlarged.

Other: Small amount of free fluid in the pelvis is likely reactive.
No free air. Abdominal wall musculature appears intact.
Postoperative changes in the groins consistent with previous hernia
repair.

Musculoskeletal: Lumbar scoliosis convex towards the left.
Degenerative changes in the lumbar spine and hips. No destructive
bone lesions.
IMPRESSION: 1. Mild prominence of gas-filled colon without wall thickening.
Likely ileus. No evidence of bowel obstruction.
2. Small amount of free fluid in the pelvis is likely reactive.
3. Prostate gland is enlarged.
4. Aortic atherosclerosis.
5. Mild peripheral interstitial lung changes, likely fibrosis.

Aortic Atherosclerosis ([id number]).

## 2021-09-15 IMAGING — DX DG ABDOMEN ACUTE W/ 1V CHEST
3 series · 3 of 3 positions shown · non-contrast
Comparison: 02/14/2020

CLINICAL DATA: Chest pain.  Constipation.  Foreign body in rectum.

EXAM:
DG ABDOMEN ACUTE W/ 1V CHEST

[chest pa]
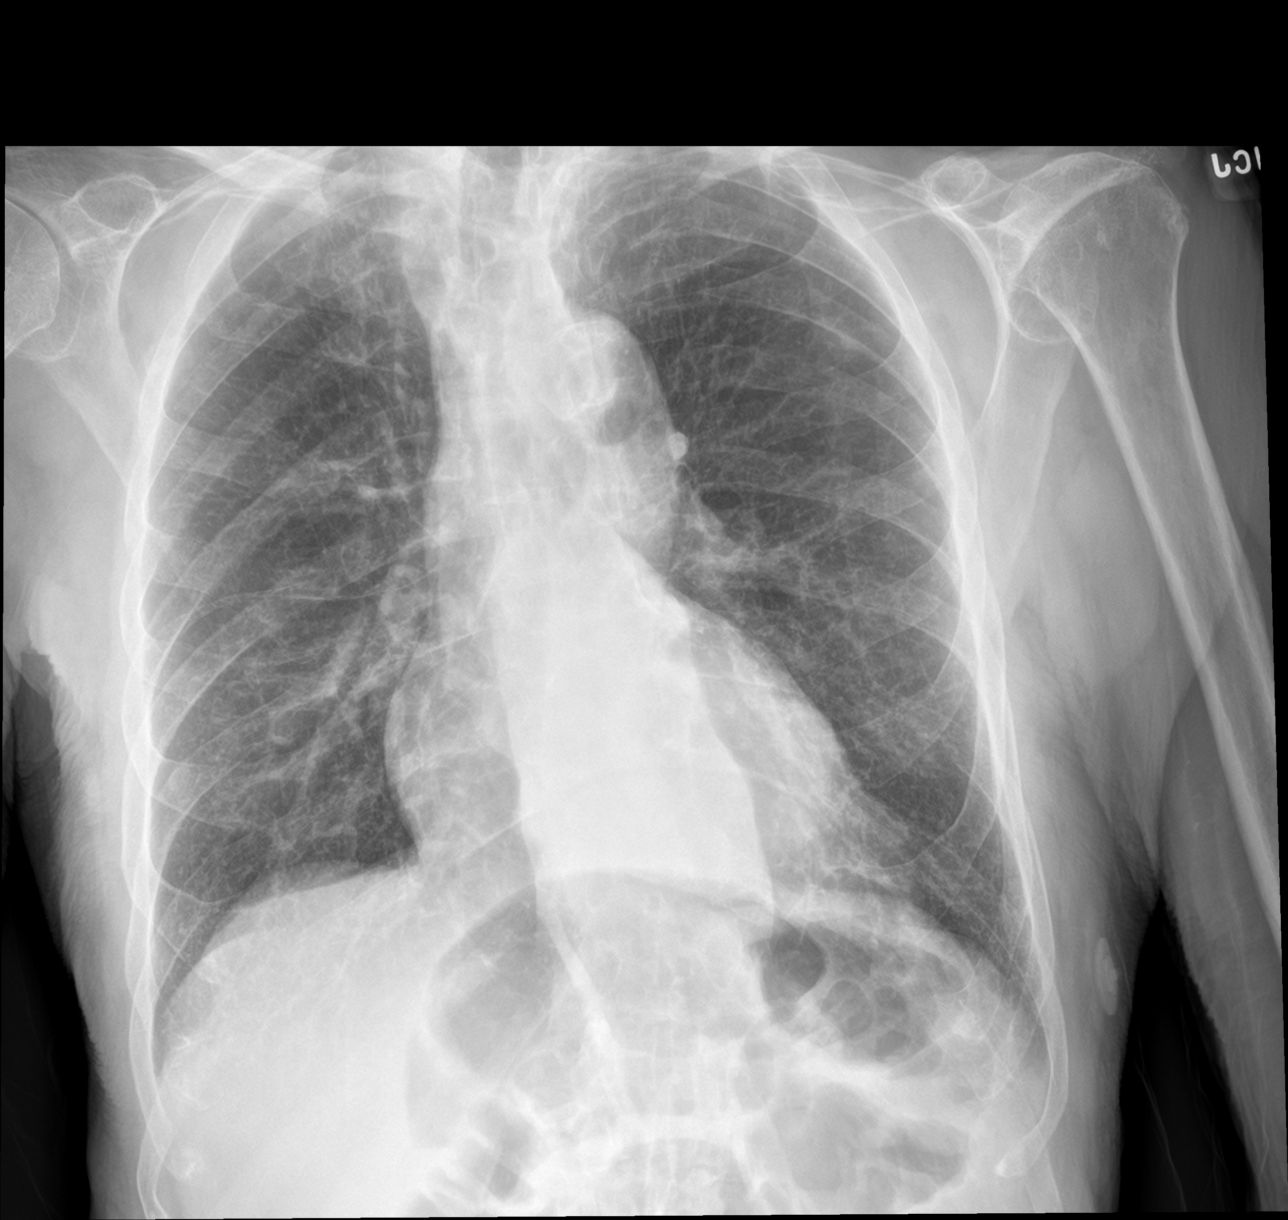

[abdomen erect]
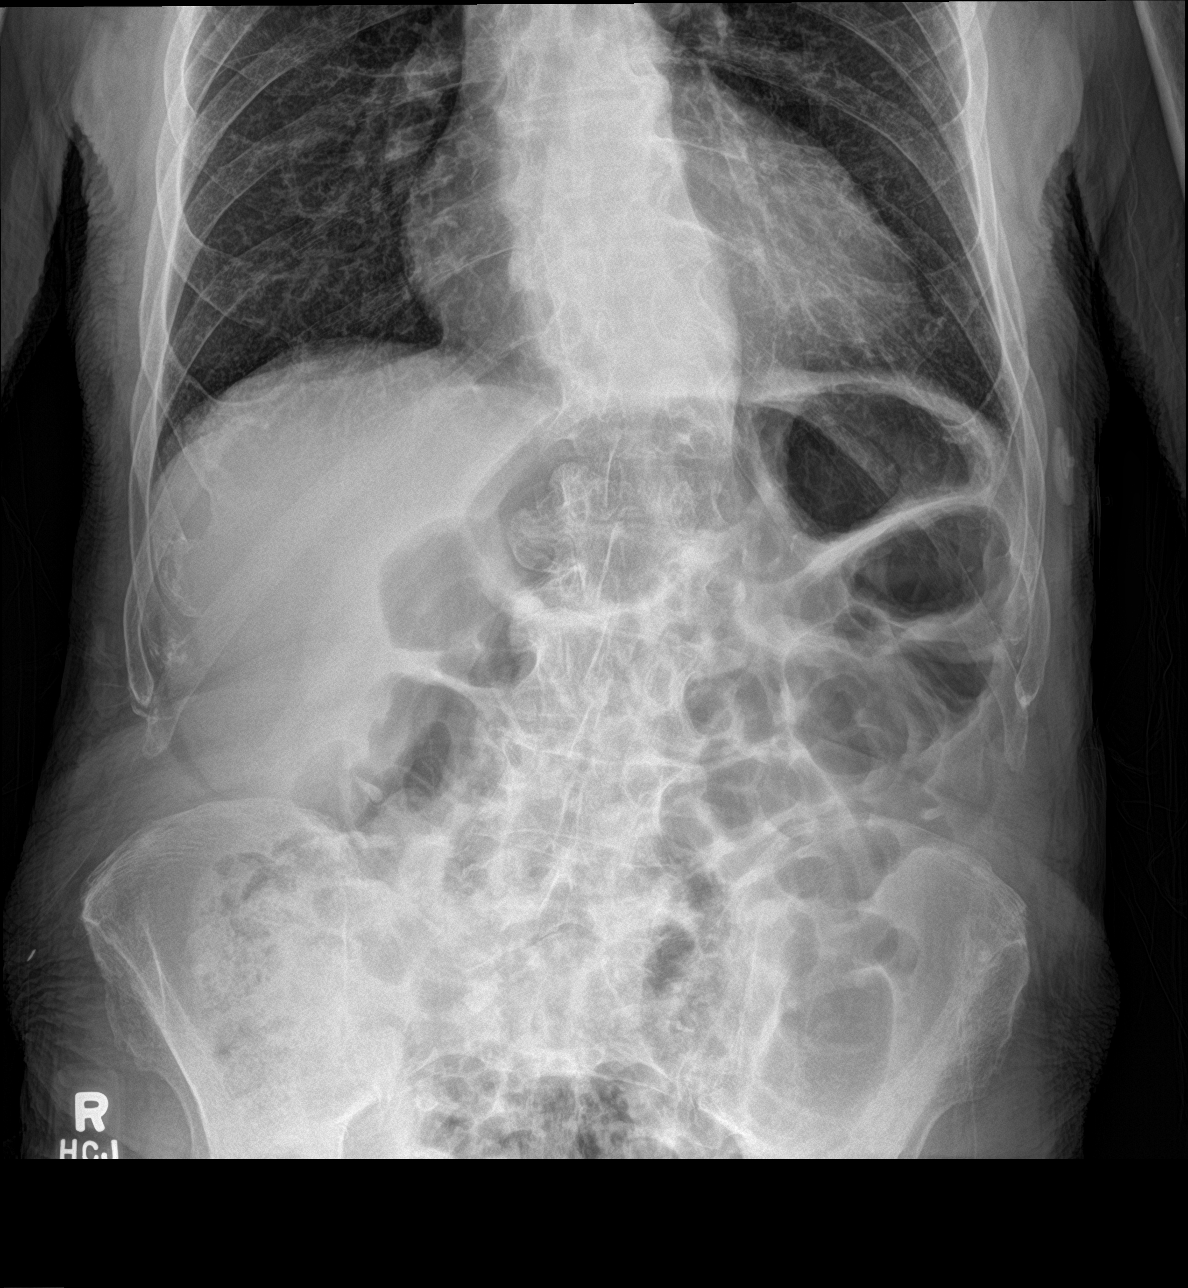

[abdomen supine]
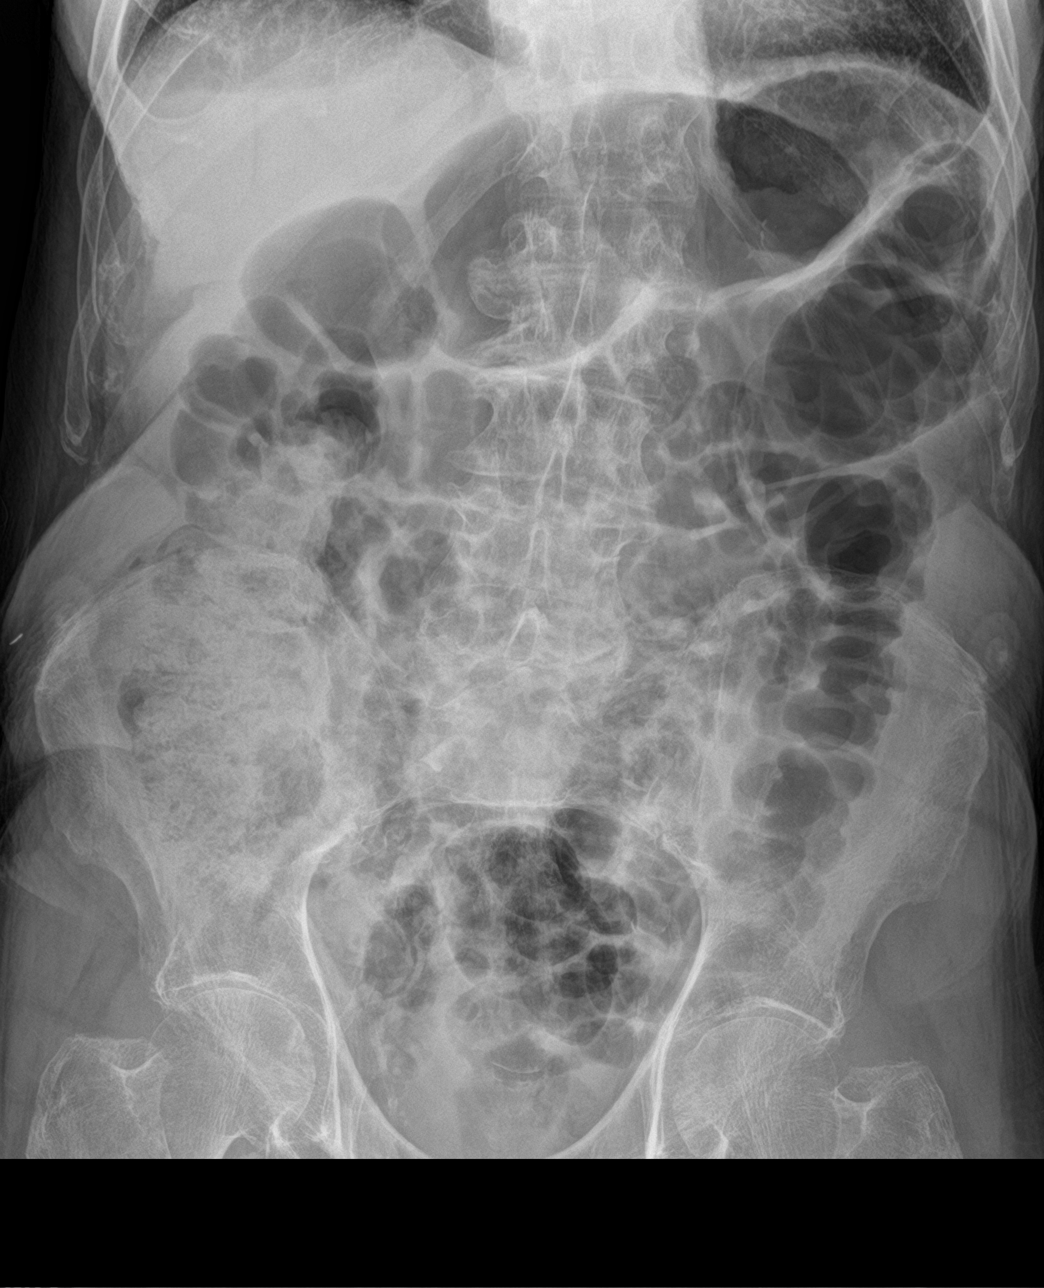

[3 of 3 positions shown; findings below may reference images not displayed]

FINDINGS: Heart is normal size.  No confluent opacities or effusions.

Mild diffuse gaseous distention of bowel. No evidence of bowel
obstruction. Moderate stool burden in the right colon. No
organomegaly, free air, suspicious calcification or visible
radiopaque foreign body.
IMPRESSION: Mild diffuse gaseous distention of bowel. Moderate stool burden in
the right colon.

No acute cardiopulmonary disease.

## 2021-10-20 DIAGNOSIS — E7849 Other hyperlipidemia: Secondary | ICD-10-CM | POA: Diagnosis not present

## 2021-10-20 DIAGNOSIS — K589 Irritable bowel syndrome without diarrhea: Secondary | ICD-10-CM | POA: Diagnosis not present

## 2021-10-20 DIAGNOSIS — Z1389 Encounter for screening for other disorder: Secondary | ICD-10-CM | POA: Diagnosis not present

## 2021-10-20 DIAGNOSIS — D692 Other nonthrombocytopenic purpura: Secondary | ICD-10-CM | POA: Diagnosis not present

## 2021-10-20 DIAGNOSIS — E782 Mixed hyperlipidemia: Secondary | ICD-10-CM | POA: Diagnosis not present

## 2021-10-20 DIAGNOSIS — D649 Anemia, unspecified: Secondary | ICD-10-CM | POA: Diagnosis not present

## 2021-10-20 DIAGNOSIS — Z0001 Encounter for general adult medical examination with abnormal findings: Secondary | ICD-10-CM | POA: Diagnosis not present

## 2021-10-20 DIAGNOSIS — I10 Essential (primary) hypertension: Secondary | ICD-10-CM | POA: Diagnosis not present

## 2021-10-20 DIAGNOSIS — I25119 Atherosclerotic heart disease of native coronary artery with unspecified angina pectoris: Secondary | ICD-10-CM | POA: Diagnosis not present

## 2021-10-20 DIAGNOSIS — Z6820 Body mass index (BMI) 20.0-20.9, adult: Secondary | ICD-10-CM | POA: Diagnosis not present

## 2021-10-25 DIAGNOSIS — N183 Chronic kidney disease, stage 3 unspecified: Secondary | ICD-10-CM | POA: Diagnosis not present

## 2021-10-25 DIAGNOSIS — R7989 Other specified abnormal findings of blood chemistry: Secondary | ICD-10-CM | POA: Diagnosis not present

## 2022-04-14 DIAGNOSIS — Z23 Encounter for immunization: Secondary | ICD-10-CM | POA: Diagnosis not present

## 2022-04-14 DIAGNOSIS — M5412 Radiculopathy, cervical region: Secondary | ICD-10-CM | POA: Diagnosis not present

## 2022-04-14 DIAGNOSIS — M25511 Pain in right shoulder: Secondary | ICD-10-CM | POA: Diagnosis not present

## 2022-07-26 ENCOUNTER — Other Ambulatory Visit: Payer: Self-pay | Admitting: Cardiology

## 2022-08-01 DIAGNOSIS — H1032 Unspecified acute conjunctivitis, left eye: Secondary | ICD-10-CM | POA: Diagnosis not present

## 2022-08-18 ENCOUNTER — Other Ambulatory Visit: Payer: Self-pay | Admitting: Cardiology

## 2022-08-24 ENCOUNTER — Other Ambulatory Visit: Payer: Self-pay | Admitting: Cardiology

## 2022-09-12 ENCOUNTER — Other Ambulatory Visit: Payer: Self-pay | Admitting: Cardiology

## 2022-09-25 ENCOUNTER — Other Ambulatory Visit: Payer: Self-pay | Admitting: Cardiology

## 2022-09-29 ENCOUNTER — Other Ambulatory Visit: Payer: Self-pay | Admitting: Cardiology

## 2022-12-06 DIAGNOSIS — I25119 Atherosclerotic heart disease of native coronary artery with unspecified angina pectoris: Secondary | ICD-10-CM | POA: Diagnosis not present

## 2022-12-06 DIAGNOSIS — D649 Anemia, unspecified: Secondary | ICD-10-CM | POA: Diagnosis not present

## 2022-12-06 DIAGNOSIS — N183 Chronic kidney disease, stage 3 unspecified: Secondary | ICD-10-CM | POA: Diagnosis not present

## 2022-12-06 DIAGNOSIS — E7849 Other hyperlipidemia: Secondary | ICD-10-CM | POA: Diagnosis not present

## 2022-12-06 DIAGNOSIS — I214 Non-ST elevation (NSTEMI) myocardial infarction: Secondary | ICD-10-CM | POA: Diagnosis not present

## 2022-12-06 DIAGNOSIS — I10 Essential (primary) hypertension: Secondary | ICD-10-CM | POA: Diagnosis not present

## 2022-12-06 DIAGNOSIS — Z0001 Encounter for general adult medical examination with abnormal findings: Secondary | ICD-10-CM | POA: Diagnosis not present

## 2022-12-08 DIAGNOSIS — Z0001 Encounter for general adult medical examination with abnormal findings: Secondary | ICD-10-CM | POA: Diagnosis not present

## 2022-12-08 DIAGNOSIS — K2100 Gastro-esophageal reflux disease with esophagitis, without bleeding: Secondary | ICD-10-CM | POA: Diagnosis not present

## 2022-12-08 DIAGNOSIS — I10 Essential (primary) hypertension: Secondary | ICD-10-CM | POA: Diagnosis not present

## 2022-12-08 DIAGNOSIS — N183 Chronic kidney disease, stage 3 unspecified: Secondary | ICD-10-CM | POA: Diagnosis not present

## 2022-12-08 DIAGNOSIS — E7849 Other hyperlipidemia: Secondary | ICD-10-CM | POA: Diagnosis not present

## 2022-12-09 DIAGNOSIS — I10 Essential (primary) hypertension: Secondary | ICD-10-CM | POA: Diagnosis not present

## 2022-12-09 DIAGNOSIS — K2100 Gastro-esophageal reflux disease with esophagitis, without bleeding: Secondary | ICD-10-CM | POA: Diagnosis not present

## 2022-12-09 DIAGNOSIS — Z0001 Encounter for general adult medical examination with abnormal findings: Secondary | ICD-10-CM | POA: Diagnosis not present

## 2022-12-09 DIAGNOSIS — E782 Mixed hyperlipidemia: Secondary | ICD-10-CM | POA: Diagnosis not present

## 2022-12-09 DIAGNOSIS — N183 Chronic kidney disease, stage 3 unspecified: Secondary | ICD-10-CM | POA: Diagnosis not present

## 2022-12-12 DIAGNOSIS — Z23 Encounter for immunization: Secondary | ICD-10-CM | POA: Diagnosis not present

## 2022-12-12 DIAGNOSIS — D649 Anemia, unspecified: Secondary | ICD-10-CM | POA: Diagnosis not present

## 2022-12-12 DIAGNOSIS — K589 Irritable bowel syndrome without diarrhea: Secondary | ICD-10-CM | POA: Diagnosis not present

## 2022-12-12 DIAGNOSIS — E7849 Other hyperlipidemia: Secondary | ICD-10-CM | POA: Diagnosis not present

## 2022-12-12 DIAGNOSIS — D692 Other nonthrombocytopenic purpura: Secondary | ICD-10-CM | POA: Diagnosis not present

## 2022-12-12 DIAGNOSIS — Z0001 Encounter for general adult medical examination with abnormal findings: Secondary | ICD-10-CM | POA: Diagnosis not present

## 2022-12-12 DIAGNOSIS — N1832 Chronic kidney disease, stage 3b: Secondary | ICD-10-CM | POA: Diagnosis not present

## 2022-12-12 DIAGNOSIS — I25119 Atherosclerotic heart disease of native coronary artery with unspecified angina pectoris: Secondary | ICD-10-CM | POA: Diagnosis not present

## 2022-12-12 DIAGNOSIS — Z6820 Body mass index (BMI) 20.0-20.9, adult: Secondary | ICD-10-CM | POA: Diagnosis not present

## 2022-12-12 DIAGNOSIS — I10 Essential (primary) hypertension: Secondary | ICD-10-CM | POA: Diagnosis not present

## 2023-05-12 DIAGNOSIS — U071 COVID-19: Secondary | ICD-10-CM | POA: Diagnosis not present

## 2023-09-10 ENCOUNTER — Encounter (INDEPENDENT_AMBULATORY_CARE_PROVIDER_SITE_OTHER): Admitting: Ophthalmology

## 2023-09-10 DIAGNOSIS — H353231 Exudative age-related macular degeneration, bilateral, with active choroidal neovascularization: Secondary | ICD-10-CM | POA: Diagnosis not present

## 2023-09-10 DIAGNOSIS — I10 Essential (primary) hypertension: Secondary | ICD-10-CM | POA: Diagnosis not present

## 2023-09-10 DIAGNOSIS — H35033 Hypertensive retinopathy, bilateral: Secondary | ICD-10-CM | POA: Diagnosis not present

## 2023-09-10 DIAGNOSIS — H43811 Vitreous degeneration, right eye: Secondary | ICD-10-CM | POA: Diagnosis not present

## 2024-02-04 ENCOUNTER — Other Ambulatory Visit: Payer: Self-pay | Admitting: Student

## 2024-02-05 NOTE — Telephone Encounter (Signed)
 Pt has not been seen since 06/30/2021 and has numerous attempts to make appt and has not done so. Would Dr. Jeffrie like to refill pt's medication nitroglycerin  without pt being seen? Please address

## 2024-02-25 DIAGNOSIS — I10 Essential (primary) hypertension: Secondary | ICD-10-CM | POA: Diagnosis not present

## 2024-02-25 DIAGNOSIS — R42 Dizziness and giddiness: Secondary | ICD-10-CM | POA: Diagnosis not present

## 2024-02-25 DIAGNOSIS — E871 Hypo-osmolality and hyponatremia: Secondary | ICD-10-CM | POA: Diagnosis not present

## 2024-02-25 DIAGNOSIS — E559 Vitamin D deficiency, unspecified: Secondary | ICD-10-CM | POA: Diagnosis not present

## 2024-02-25 DIAGNOSIS — E7849 Other hyperlipidemia: Secondary | ICD-10-CM | POA: Diagnosis not present

## 2024-02-25 DIAGNOSIS — N1832 Chronic kidney disease, stage 3b: Secondary | ICD-10-CM | POA: Diagnosis not present

## 2024-02-25 DIAGNOSIS — D559 Anemia due to enzyme disorder, unspecified: Secondary | ICD-10-CM | POA: Diagnosis not present

## 2024-03-03 DIAGNOSIS — Z0001 Encounter for general adult medical examination with abnormal findings: Secondary | ICD-10-CM | POA: Diagnosis not present

## 2024-03-03 DIAGNOSIS — K5901 Slow transit constipation: Secondary | ICD-10-CM | POA: Diagnosis not present

## 2024-03-03 DIAGNOSIS — Z681 Body mass index (BMI) 19 or less, adult: Secondary | ICD-10-CM | POA: Diagnosis not present

## 2024-03-03 DIAGNOSIS — E782 Mixed hyperlipidemia: Secondary | ICD-10-CM | POA: Diagnosis not present

## 2024-03-03 DIAGNOSIS — I10 Essential (primary) hypertension: Secondary | ICD-10-CM | POA: Diagnosis not present

## 2024-03-03 DIAGNOSIS — N1832 Chronic kidney disease, stage 3b: Secondary | ICD-10-CM | POA: Diagnosis not present

## 2024-03-03 DIAGNOSIS — Z1389 Encounter for screening for other disorder: Secondary | ICD-10-CM | POA: Diagnosis not present

## 2024-03-03 DIAGNOSIS — Z23 Encounter for immunization: Secondary | ICD-10-CM | POA: Diagnosis not present

## 2024-05-11 ENCOUNTER — Encounter (HOSPITAL_COMMUNITY): Payer: Self-pay | Admitting: Internal Medicine

## 2024-05-11 ENCOUNTER — Other Ambulatory Visit: Payer: Self-pay

## 2024-05-11 ENCOUNTER — Emergency Department (HOSPITAL_COMMUNITY)

## 2024-05-11 ENCOUNTER — Inpatient Hospital Stay (HOSPITAL_COMMUNITY)
Admission: EM | Admit: 2024-05-11 | Discharge: 2024-06-03 | DRG: 640 | Disposition: A | Discharge status: Hospice | Attending: Internal Medicine | Admitting: Internal Medicine

## 2024-05-11 DIAGNOSIS — I2510 Atherosclerotic heart disease of native coronary artery without angina pectoris: Secondary | ICD-10-CM | POA: Diagnosis present

## 2024-05-11 DIAGNOSIS — Z1152 Encounter for screening for COVID-19: Secondary | ICD-10-CM | POA: Diagnosis not present

## 2024-05-11 DIAGNOSIS — I129 Hypertensive chronic kidney disease with stage 1 through stage 4 chronic kidney disease, or unspecified chronic kidney disease: Secondary | ICD-10-CM | POA: Diagnosis present

## 2024-05-11 DIAGNOSIS — R627 Adult failure to thrive: Secondary | ICD-10-CM | POA: Diagnosis present

## 2024-05-11 DIAGNOSIS — Z79899 Other long term (current) drug therapy: Secondary | ICD-10-CM

## 2024-05-11 DIAGNOSIS — Z681 Body mass index (BMI) 19 or less, adult: Secondary | ICD-10-CM | POA: Diagnosis not present

## 2024-05-11 DIAGNOSIS — Z87891 Personal history of nicotine dependence: Secondary | ICD-10-CM

## 2024-05-11 DIAGNOSIS — M109 Gout, unspecified: Secondary | ICD-10-CM | POA: Diagnosis present

## 2024-05-11 DIAGNOSIS — Z515 Encounter for palliative care: Secondary | ICD-10-CM

## 2024-05-11 DIAGNOSIS — N1832 Chronic kidney disease, stage 3b: Secondary | ICD-10-CM | POA: Diagnosis present

## 2024-05-11 DIAGNOSIS — N179 Acute kidney failure, unspecified: Secondary | ICD-10-CM | POA: Diagnosis present

## 2024-05-11 DIAGNOSIS — E785 Hyperlipidemia, unspecified: Secondary | ICD-10-CM | POA: Diagnosis present

## 2024-05-11 DIAGNOSIS — E872 Acidosis, unspecified: Secondary | ICD-10-CM

## 2024-05-11 DIAGNOSIS — E870 Hyperosmolality and hypernatremia: Secondary | ICD-10-CM | POA: Diagnosis present

## 2024-05-11 DIAGNOSIS — F0390 Unspecified dementia without behavioral disturbance: Secondary | ICD-10-CM | POA: Diagnosis present

## 2024-05-11 DIAGNOSIS — E11649 Type 2 diabetes mellitus with hypoglycemia without coma: Secondary | ICD-10-CM | POA: Diagnosis not present

## 2024-05-11 DIAGNOSIS — I252 Old myocardial infarction: Secondary | ICD-10-CM | POA: Diagnosis not present

## 2024-05-11 DIAGNOSIS — J189 Pneumonia, unspecified organism: Secondary | ICD-10-CM | POA: Diagnosis not present

## 2024-05-11 DIAGNOSIS — R1030 Lower abdominal pain, unspecified: Secondary | ICD-10-CM | POA: Diagnosis present

## 2024-05-11 DIAGNOSIS — J101 Influenza due to other identified influenza virus with other respiratory manifestations: Secondary | ICD-10-CM | POA: Diagnosis present

## 2024-05-11 DIAGNOSIS — J1000 Influenza due to other identified influenza virus with unspecified type of pneumonia: Secondary | ICD-10-CM | POA: Diagnosis present

## 2024-05-11 DIAGNOSIS — Z66 Do not resuscitate: Secondary | ICD-10-CM | POA: Diagnosis present

## 2024-05-11 DIAGNOSIS — R64 Cachexia: Secondary | ICD-10-CM | POA: Diagnosis present

## 2024-05-11 DIAGNOSIS — E860 Dehydration: Secondary | ICD-10-CM

## 2024-05-11 DIAGNOSIS — Z7982 Long term (current) use of aspirin: Secondary | ICD-10-CM | POA: Diagnosis not present

## 2024-05-11 DIAGNOSIS — J9601 Acute respiratory failure with hypoxia: Secondary | ICD-10-CM | POA: Diagnosis present

## 2024-05-11 DIAGNOSIS — E43 Unspecified severe protein-calorie malnutrition: Secondary | ICD-10-CM | POA: Diagnosis present

## 2024-05-11 DIAGNOSIS — I4891 Unspecified atrial fibrillation: Secondary | ICD-10-CM

## 2024-05-11 DIAGNOSIS — R1111 Vomiting without nausea: Secondary | ICD-10-CM

## 2024-05-11 DIAGNOSIS — Z7189 Other specified counseling: Secondary | ICD-10-CM | POA: Diagnosis not present

## 2024-05-11 DIAGNOSIS — E1122 Type 2 diabetes mellitus with diabetic chronic kidney disease: Secondary | ICD-10-CM | POA: Diagnosis present

## 2024-05-11 LAB — URINALYSIS, W/ REFLEX TO CULTURE (INFECTION SUSPECTED)
Bacteria, UA: NONE SEEN
Bilirubin Urine: NEGATIVE
Glucose, UA: NEGATIVE mg/dL
Ketones, ur: 5 mg/dL — AB
Leukocytes,Ua: NEGATIVE
Nitrite: NEGATIVE
Protein, ur: 100 mg/dL — AB
Specific Gravity, Urine: 1.023 (ref 1.005–1.030)
pH: 5 (ref 5.0–8.0)

## 2024-05-11 LAB — LACTIC ACID, PLASMA
Lactic Acid, Venous: 3.1 mmol/L (ref 0.5–1.9)
Lactic Acid, Venous: 3.3 mmol/L (ref 0.5–1.9)
Lactic Acid, Venous: 3.1 mmol/L (ref 0.5–1.9)

## 2024-05-11 LAB — TROPONIN T, HIGH SENSITIVITY
Troponin T High Sensitivity: 65 ng/L — ABNORMAL HIGH (ref 0–19)
Troponin T High Sensitivity: 65 ng/L — ABNORMAL HIGH (ref 0–19)

## 2024-05-11 LAB — RESP PANEL BY RT-PCR (RSV, FLU A&B, COVID)  RVPGX2
Influenza A by PCR: POSITIVE — AB
Influenza B by PCR: NEGATIVE
Resp Syncytial Virus by PCR: NEGATIVE
SARS Coronavirus 2 by RT PCR: NEGATIVE

## 2024-05-11 LAB — BASIC METABOLIC PANEL WITH GFR
Anion gap: 20 — ABNORMAL HIGH (ref 5–15)
BUN: 37 mg/dL — ABNORMAL HIGH (ref 8–23)
CO2: 18 mmol/L — ABNORMAL LOW (ref 22–32)
Calcium: 9.4 mg/dL (ref 8.9–10.3)
Chloride: 117 mmol/L — ABNORMAL HIGH (ref 98–111)
Creatinine, Ser: 1.88 mg/dL — ABNORMAL HIGH (ref 0.61–1.24)
GFR, Estimated: 31 mL/min — ABNORMAL LOW
Glucose, Bld: 79 mg/dL (ref 70–99)
Potassium: 4.6 mmol/L (ref 3.5–5.1)
Sodium: 155 mmol/L — ABNORMAL HIGH (ref 135–145)

## 2024-05-11 LAB — CBC WITH DIFFERENTIAL/PLATELET
Abs Immature Granulocytes: 0.02 K/uL (ref 0.00–0.07)
Basophils Absolute: 0 K/uL (ref 0.0–0.1)
Basophils Relative: 0 %
Eosinophils Absolute: 0 K/uL (ref 0.0–0.5)
Eosinophils Relative: 0 %
HCT: 52.3 % — ABNORMAL HIGH (ref 39.0–52.0)
Hemoglobin: 15.6 g/dL (ref 13.0–17.0)
Immature Granulocytes: 0 %
Lymphocytes Relative: 14 %
Lymphs Abs: 1.3 K/uL (ref 0.7–4.0)
MCH: 30.2 pg (ref 26.0–34.0)
MCHC: 29.8 g/dL — ABNORMAL LOW (ref 30.0–36.0)
MCV: 101.2 fL — ABNORMAL HIGH (ref 80.0–100.0)
Monocytes Absolute: 0.9 K/uL (ref 0.1–1.0)
Monocytes Relative: 10 %
Neutro Abs: 6.9 K/uL (ref 1.7–7.7)
Neutrophils Relative %: 76 %
Platelets: 161 K/uL (ref 150–400)
RBC: 5.17 MIL/uL (ref 4.22–5.81)
RDW: 17.1 % — ABNORMAL HIGH (ref 11.5–15.5)
WBC: 9 K/uL (ref 4.0–10.5)
nRBC: 0 % (ref 0.0–0.2)

## 2024-05-11 LAB — TSH: TSH: 2.96 u[IU]/mL (ref 0.350–4.500)

## 2024-05-11 LAB — HEPATIC FUNCTION PANEL
ALT: 18 U/L (ref 0–44)
AST: 36 U/L (ref 15–41)
Albumin: 3.6 g/dL (ref 3.5–5.0)
Alkaline Phosphatase: 162 U/L — ABNORMAL HIGH (ref 38–126)
Bilirubin, Direct: 0.7 mg/dL — ABNORMAL HIGH (ref 0.0–0.2)
Indirect Bilirubin: 0.5 mg/dL (ref 0.3–0.9)
Total Bilirubin: 1.2 mg/dL (ref 0.0–1.2)
Total Protein: 7.5 g/dL (ref 6.5–8.1)

## 2024-05-11 LAB — PROCALCITONIN: Procalcitonin: 0.25 ng/mL

## 2024-05-11 LAB — LIPASE, BLOOD: Lipase: 37 U/L (ref 11–51)

## 2024-05-11 LAB — MAGNESIUM: Magnesium: 2.3 mg/dL (ref 1.7–2.4)

## 2024-05-11 LAB — GLUCOSE, CAPILLARY: Glucose-Capillary: 99 mg/dL (ref 70–99)

## 2024-05-11 MED ORDER — OSELTAMIVIR PHOSPHATE 75 MG PO CAPS: 75.0000 mg | ORAL_CAPSULE | Freq: Every day | ORAL | Status: DC

## 2024-05-11 MED ORDER — SODIUM CHLORIDE 0.45 % IV SOLN: INTRAVENOUS | Status: AC

## 2024-05-11 MED ORDER — HEPARIN SODIUM (PORCINE) 5000 UNIT/ML IJ SOLN
5000.0000 [IU] | Freq: Three times a day (TID) | INTRAMUSCULAR | Status: DC
  Administered 2024-05-11 – 2024-05-18 (×19): 5000 [IU] via SUBCUTANEOUS
  Filled 2024-05-11 (×19): qty 1

## 2024-05-11 MED ORDER — GUAIFENESIN-DM 100-10 MG/5ML PO SYRP
15.0000 mL | ORAL_SOLUTION | Freq: Three times a day (TID) | ORAL | Status: AC
  Administered 2024-05-11 – 2024-05-12 (×3): 15 mL via ORAL
  Filled 2024-05-11 (×3): qty 15

## 2024-05-11 MED ORDER — SODIUM CHLORIDE 0.9 % IV SOLN
2.0000 g | INTRAVENOUS | Status: DC
  Administered 2024-05-12 – 2024-05-14 (×3): 2 g via INTRAVENOUS
  Filled 2024-05-11 (×3): qty 20

## 2024-05-11 MED ORDER — ACETAMINOPHEN 650 MG RE SUPP
650.0000 mg | Freq: Four times a day (QID) | RECTAL | Status: DC | PRN
  Administered 2024-05-13: 650 mg via RECTAL
  Filled 2024-05-11: qty 1

## 2024-05-11 MED ORDER — ONDANSETRON HCL 4 MG/2ML IJ SOLN
4.0000 mg | Freq: Four times a day (QID) | INTRAMUSCULAR | Status: DC | PRN
  Administered 2024-05-13: 4 mg via INTRAVENOUS
  Filled 2024-05-11: qty 2

## 2024-05-11 MED ORDER — LACTATED RINGERS IV BOLUS
500.0000 mL | Freq: Once | INTRAVENOUS | Status: AC
  Administered 2024-05-11: 500 mL via INTRAVENOUS

## 2024-05-11 MED ORDER — METOPROLOL TARTRATE 25 MG PO TABS
25.0000 mg | ORAL_TABLET | Freq: Two times a day (BID) | ORAL | Status: DC
  Administered 2024-05-11 – 2024-06-03 (×42): 25 mg via ORAL
  Filled 2024-05-11 (×30): qty 1

## 2024-05-11 MED ORDER — ONDANSETRON HCL 4 MG PO TABS
4.0000 mg | ORAL_TABLET | Freq: Four times a day (QID) | ORAL | Status: DC | PRN
  Administered 2024-05-12: 4 mg via ORAL
  Filled 2024-05-11: qty 1

## 2024-05-11 MED ORDER — SODIUM CHLORIDE 0.9 % IV SOLN
500.0000 mg | INTRAVENOUS | Status: AC
  Administered 2024-05-11 – 2024-05-15 (×5): 500 mg via INTRAVENOUS
  Filled 2024-05-11 (×5): qty 5

## 2024-05-11 MED ORDER — OSELTAMIVIR PHOSPHATE 30 MG PO CAPS
30.0000 mg | ORAL_CAPSULE | Freq: Every day | ORAL | Status: AC
  Administered 2024-05-11 – 2024-05-15 (×5): 30 mg via ORAL
  Filled 2024-05-11 (×5): qty 1

## 2024-05-11 MED ORDER — ASPIRIN 81 MG PO CHEW
81.0000 mg | CHEWABLE_TABLET | Freq: Every day | ORAL | Status: DC
  Administered 2024-05-11 – 2024-06-03 (×23): 81 mg via ORAL
  Filled 2024-05-11 (×17): qty 1

## 2024-05-11 MED ORDER — SODIUM CHLORIDE 0.9 % IV SOLN
1.0000 g | Freq: Once | INTRAVENOUS | Status: AC
  Administered 2024-05-11: 1 g via INTRAVENOUS
  Filled 2024-05-11: qty 10

## 2024-05-11 MED ORDER — ACETAMINOPHEN 325 MG PO TABS
650.0000 mg | ORAL_TABLET | Freq: Four times a day (QID) | ORAL | Status: DC | PRN
  Filled 2024-05-11: qty 2

## 2024-05-11 MED ORDER — POLYETHYLENE GLYCOL 3350 17 G PO PACK: 17.0000 g | PACK | Freq: Every day | ORAL | Status: DC | PRN

## 2024-05-11 MED ORDER — METOPROLOL TARTRATE 5 MG/5ML IV SOLN
5.0000 mg | INTRAVENOUS | Status: DC | PRN
  Administered 2024-05-11: 5 mg via INTRAVENOUS
  Filled 2024-05-11: qty 5

## 2024-05-11 NOTE — ED Provider Notes (Signed)
 " [place] EMERGENCY DEPARTMENT AT [facility] Provider Note   CSN: [id number] Arrival date & time: 05/11/24  1141     Patient presents with: Abdominal Pain   Hector Lowe is a 89 y.o. male.   89 year old male brought in by family for evaluation of weakness and dehydration.  They state he has not been eating for about a week and has had worsening cough.  Patient states he is also been short of breath and had p productive cough with sputum.  Family states that every time he tries to eat a bite of anything he vomits it a few minutes later.  Patient denies any fever but does admit to fatigue and weakness.  States he is having trouble ambulating due to weakness.   Abdominal Pain Associated symptoms: cough, fatigue, nausea and vomiting   Associated symptoms: no chest pain, no chills, no dysuria, no fever, no hematuria, no shortness of breath and no sore throat        Prior to Admission medications  Medication Sig Start Date End Date Taking? Authorizing Provider  amLODipine  (NORVASC ) 5 MG tablet Take 1 tablet (5 mg total) by mouth daily. 03/12/20   Samtani, Jai-Gurmukh, MD  aspirin  81 MG chewable tablet Chew 1 tablet (81 mg total) by mouth daily. 06/13/21   Goodrich, Callie E, PA-C  atorvastatin  (LIPITOR ) 80 MG tablet Take 1 tablet (80 mg total) by mouth daily. 08/18/22   Jeffrie Oneil BROCKS, MD  isosorbide  mononitrate (IMDUR ) 30 MG 24 hr tablet Take 1 tablet by mouth once daily 09/12/22   Jeffrie Oneil BROCKS, MD  linaclotide  (LINZESS ) 290 MCG CAPS capsule Take 1 capsule (290 mcg total) by mouth daily before breakfast. 10/11/20   Ezzard Sonny RAMAN, PA-C  metoprolol  tartrate (LOPRESSOR ) 25 MG tablet Take 1 tablet (25 mg total) by mouth 2 (two) times daily. 08/18/22   Jeffrie Oneil BROCKS, MD  Multiple Vitamins-Minerals (PRESERVISION AREDS PO) Take 1 capsule by mouth in the morning and at bedtime.    [provider]  nitroGLYCERIN  (NITROSTAT ) 0.4 MG SL tablet DISSOLVE ONE TABLET UNDER THE  TONGUE EVERY 5 MINUTES AS NEEDED FOR CHEST PAIN.  DO NOT EXCEED A TOTAL OF 3 DOSES IN 15 MINUTES 02/06/24   Jeffrie Oneil BROCKS, MD  omeprazole  (PRILOSEC) 20 MG capsule Take 1 capsule (20 mg total) by mouth 2 (two) times daily before a meal. 04/30/20   Ezzard Sonny RAMAN, PA-C  polyethylene glycol (MIRALAX  / GLYCOLAX ) 17 g packet Take 17 g by mouth 2 (two) times daily. Patient taking differently: Take 34 g by mouth in the morning. 2 capfuls 03/11/20   Samtani, Jai-Gurmukh, MD    Allergies: Patient has no known allergies.    Review of Systems  Constitutional:  Positive for fatigue. Negative for chills and fever.  HENT:  Negative for ear pain and sore throat.   Eyes:  Negative for pain and visual disturbance.  Respiratory:  Positive for cough. Negative for shortness of breath.   Cardiovascular:  Negative for chest pain and palpitations.  Gastrointestinal:  Positive for abdominal pain, nausea and vomiting.  Genitourinary:  Negative for dysuria and hematuria.  Musculoskeletal:  Negative for arthralgias and back pain.  Skin:  Negative for color change and rash.  Neurological:  Positive for weakness. Negative for seizures and syncope.  All other systems reviewed and are negative.   Updated Vital Signs BP (!) 135/97   Pulse (!) 127   Temp 98.1 F (36.7 C) (Oral)  Resp 15   SpO2 100%   Physical Exam Vitals and nursing note reviewed.  Constitutional:      General: He is not in acute distress.    Appearance: He is well-developed. He is ill-appearing.  HENT:     Head: Normocephalic and atraumatic.     Mouth/Throat:     Pharynx: Oropharynx is clear.     Comments: Dry mucous membranes  Eyes:     Conjunctiva/sclera: Conjunctivae normal.  Cardiovascular:     Rate and Rhythm: Tachycardia present. Rhythm irregular.     Heart sounds: No murmur heard. Pulmonary:     Effort: Pulmonary effort is normal. No respiratory distress.     Breath sounds: Normal breath sounds.  Abdominal:     Palpations:  Abdomen is soft.     Tenderness: There is no abdominal tenderness.  Musculoskeletal:        General: No swelling.     Cervical back: Neck supple.  Skin:    General: Skin is warm and dry.     Capillary Refill: Capillary refill takes less than 2 seconds.  Neurological:     Mental Status: He is alert.  Psychiatric:        Mood and Affect: Mood normal.     (all labs ordered are listed, but only abnormal results are displayed) Labs Reviewed  RESP PANEL BY RT-PCR (RSV, FLU A&B, COVID)  RVPGX2 - Abnormal; Notable for the following components:      Result Value   Influenza A by PCR POSITIVE (*)    All other components within normal limits  CBC WITH DIFFERENTIAL/PLATELET - Abnormal; Notable for the following components:   HCT 52.3 (*)    MCV 101.2 (*)    MCHC 29.8 (*)    RDW 17.1 (*)    All other components within normal limits  LACTIC ACID, PLASMA - Abnormal; Notable for the following components:   Lactic Acid, Venous 3.1 (*)    All other components within normal limits  LACTIC ACID, PLASMA - Abnormal; Notable for the following components:   Lactic Acid, Venous 3.3 (*)    All other components within normal limits  URINALYSIS, W/ REFLEX TO CULTURE (INFECTION SUSPECTED) - Abnormal; Notable for the following components:   Color, Urine AMBER (*)    APPearance HAZY (*)    Hgb urine dipstick SMALL (*)    Ketones, ur 5 (*)    Protein, ur 100 (*)    All other components within normal limits  BASIC METABOLIC PANEL WITH GFR - Abnormal; Notable for the following components:   Sodium 155 (*)    Chloride 117 (*)    CO2 18 (*)    BUN 37 (*)    Creatinine, Ser 1.88 (*)    GFR, Estimated 31 (*)    Anion gap 20 (*)    All other components within normal limits  HEPATIC FUNCTION PANEL - Abnormal; Notable for the following components:   Alkaline Phosphatase 162 (*)    Bilirubin, Direct 0.7 (*)    All other components within normal limits  TROPONIN T, HIGH SENSITIVITY - Abnormal; Notable for  the following components:   Troponin T High Sensitivity 65 (*)    All other components within normal limits  TROPONIN T, HIGH SENSITIVITY - Abnormal; Notable for the following components:   Troponin T High Sensitivity 65 (*)    All other components within normal limits  CULTURE, BLOOD (ROUTINE X 2)  CULTURE, BLOOD (ROUTINE X 2)  LIPASE, BLOOD  PROCALCITONIN    EKG: EKG Interpretation Date/Time:  Sunday May 11 2024 12:54:24 EST Ventricular Rate:  121 PR Interval:    QRS Duration:  97 QT Interval:  306 QTC Calculation: 416 R Axis:   -38  Text Interpretation: Atrial fibrillation with rapid ventricular response Left axis deviation Anterior infarct, age indeterminate Compared with prior EKG from 07/22/2023 Confirmed by Gennaro Bouchard ([id number]) on 05/11/2024 1:01:35 PM  Radiology: CT CHEST ABDOMEN PELVIS WO CONTRAST Result Date: 05/11/2024 CLINICAL DATA:  Pneumonia. EXAM: CT CHEST, ABDOMEN AND PELVIS WITHOUT CONTRAST TECHNIQUE: Multidetector CT imaging of the chest, abdomen and pelvis was performed following the standard protocol without IV contrast. RADIATION DOSE REDUCTION: This exam was performed according to the departmental dose-optimization program which includes automated exposure control, adjustment of the mA and/or kV according to patient size and/or use of iterative reconstruction technique. COMPARISON:  Chest radiograph dated 05/11/2024. FINDINGS: Evaluation of this exam is limited in the absence of intravenous contrast as well as due to respiratory motion. CT CHEST FINDINGS Cardiovascular: . there is no cardiomegaly. Mild dilatation of the right atrium. There is coronary vascular calcification. Moderate atherosclerotic calcification of the thoracic aorta. No intimal dilatation. The central pulmonary arteries are grossly unremarkable. Mediastinum/Nodes: No hilar or mediastinal adenopathy. The esophagus is grossly unremarkable. No mediastinal fluid collection. Lungs/Pleura: Moderate  right and small left pleural effusions. There is partial compressive atelectasis of the lower lobes. Pneumonia is not excluded. There is diffuse interstitial prominence predominantly involving the lower lungs suggestive of edema. No pneumothorax. The central airways are patent. Musculoskeletal: Osteopenia with degenerative changes of the spine. No acute osseous pathology. CT ABDOMEN PELVIS FINDINGS No intra-abdominal free air or free fluid. Hepatobiliary: The liver is unremarkable. No biliary dilatation. No calcified gallstone. Pancreas: Moderate atrophy of the pancreas. No active inflammatory changes. No dilatation of the main pancreatic duct. Spleen: Normal in size without focal abnormality. Adrenals/Urinary Tract: The adrenal glands unremarkable. There is no hydronephrosis or nephrolithiasis on either side. There is trabeculated appearance of the bladder wall consistent with chronic bladder outlet obstruction. Stomach/Bowel: There is no bowel obstruction or active inflammation. The appendix is normal. Vascular/Lymphatic: Advanced aortoiliac atherosclerotic disease. The IVC is unremarkable. No portal venous gas. There is no adenopathy. Reproductive: The prostate is grossly unremarkable. Other: None Musculoskeletal: Osteopenia with degenerative changes of the spine. No acute osseous pathology. IMPRESSION: 1. Moderate right and small left pleural effusions with partial compressive atelectasis of the lower lobes. Pneumonia is not excluded. 2. No acute intra-abdominal or pelvic pathology. No bowel obstruction. Normal appendix. 3.  Aortic Atherosclerosis (ICD10-I70.0). Electronically Signed   By: Vanetta Chou M.D.   On: 05/11/2024 15:19   DG Chest 1 View Result Date: 05/11/2024 EXAM: 1 VIEW(S) XRAY OF THE CHEST 05/11/2024 12:49:00 PM COMPARISON: AP radiograph of the chest dated 06/23/2021. CLINICAL HISTORY: SOB FINDINGS: LUNGS AND PLEURA: Diffuse peripheral predominant interstitial opacities. Trace bilateral  pleural effusions. No pneumothorax. HEART AND MEDIASTINUM: Mild cardiomegaly. Aortic atherosclerosis. BONES AND SOFT TISSUES: Degenerative changes of the left shoulder. IMPRESSION: 1. Diffuse peripheral predominant interstitial opacities and trace bilateral pleural effusions. 2. Mild cardiomegaly and aortic atherosclerosis. Electronically signed by: Evalene Coho MD 05/11/2024 12:53 PM EST RP Workstation: [id number]     .Critical Care  Performed by: Gennaro Bouchard CROME, DO Authorized by: Gennaro Bouchard CROME, DO   Critical care provider statement:    Critical care time (minutes):  45   Critical care time was exclusive of:  Separately billable procedures and treating other patients  and teaching time   Critical care was necessary to treat or prevent imminent or life-threatening deterioration of the following conditions:  Respiratory failure, dehydration and endocrine crisis   Critical care was time spent personally by me on the following activities:  Development of treatment plan with patient or surrogate, discussions with consultants, evaluation of patient's response to treatment, examination of patient, ordering and review of laboratory studies, ordering and review of radiographic studies, ordering and performing treatments and interventions, pulse oximetry, re-evaluation of patient's condition and review of old charts   Care discussed with: admitting provider      Medications Ordered in the ED  cefTRIAXone  (ROCEPHIN ) 1 g in sodium chloride  0.9 % 100 mL IVPB (1 g Intravenous New Bag/Given 05/11/24 1534)  azithromycin  (ZITHROMAX ) 500 mg in sodium chloride  0.9 % 250 mL IVPB (has no administration in time range)  metoprolol  tartrate (LOPRESSOR ) injection 5 mg (has no administration in time range)  lactated ringers  bolus 500 mL (0 mLs Intravenous Stopped 05/11/24 1501)                                    Medical Decision Making Cardiac monitor interpretation: Atrial fibrillation, rapid  rate  Patient here for weakness not being able to eat for last week and productive cough.  Found to have pneumonia as well as influenza A.  He is hypoxic on room air at 87 to 88% and was placed on 2 L nasal cannula.  He has no leukocytosis but does also have a hypernatremia.  Slower IV fluid bolus was given due to patient's hypernatremia as we do not want to correct that too quickly, also did not require 30 cc/kg IV fluid bolus as he is not septic, but likely does have A-fib with RVR from his dehydration.  Also with pleural effusions on x-ray. he was also given a liter IV fluid bolus to start..  Found to be in A-fib with RVR on arrival but this did seem to improve with IV fluids as well.  Will start him on antibiotics for pneumonia.  Patient's case discussed with hospitalist and patient will be admitted for further workup and management.  All results and plan discussed with patient and family at bedside.  They are agreeable to plan.  Problems Addressed: Acute hypoxic respiratory failure (HCC): acute illness or injury that poses a threat to life or bodily functions AKI (acute kidney injury): acute illness or injury Atrial fibrillation with rapid ventricular response (HCC): acute illness or injury that poses a threat to life or bodily functions Dehydration: acute illness or injury Hypernatremia: acute illness or injury that poses a threat to life or bodily functions Influenza A: acute illness or injury Pneumonia due to infectious organism, unspecified laterality, unspecified part of lung: acute illness or injury that poses a threat to life or bodily functions Vomiting without nausea, unspecified vomiting type: undiagnosed new problem with uncertain prognosis  Amount and/or Complexity of Data Reviewed External Data Reviewed: notes.    Details: ED records reviewed and patient seen 06-23-2021 for NSTEMI Labs: ordered. Decision-making details documented in ED Course.    Details: Ordered and reviewed by me  patient is positive for flu A, has elevated lactic acid and AKI Radiology: ordered and independent interpretation performed. Decision-making details documented in ED Course.    Details: Ordered and interpreted by me independently of radiology Chest x-ray: Shows possible pneumonia CT chest abdomen pelvis: Shows evidence  of likely pneumonia ECG/medicine tests: ordered and independent interpretation performed. Decision-making details documented in ED Course.    Details: Ordered and interpreted by me in the absence of cardiology and shows fib with RVR, no STEMI  Discussion of management or test interpretation with external provider(s): Dr. Pearlean -hospitalist-discussed patient's case with her and she will admit for further workup and management.  Risk OTC drugs. Prescription drug management. Drug therapy requiring intensive monitoring for toxicity. Decision regarding hospitalization. Risk Details: CRITICAL CARE Performed by: Duwaine LITTIE Fusi   Total critical care time: 45 minutes  Critical care time was exclusive of separately billable procedures and treating other patients.  Critical care was necessary to treat or prevent imminent or life-threatening deterioration.  Critical care was time spent personally by me on the following activities: development of treatment plan with patient and/or surrogate as well as nursing, discussions with consultants, evaluation of patient's response to treatment, examination of patient, obtaining history from patient or surrogate, ordering and performing treatments and interventions, ordering and review of laboratory studies, ordering and review of radiographic studies, pulse oximetry and re-evaluation of patient's condition.   Critical Care Total time providing critical care: 45 minutes      Final diagnoses:  Acute hypoxic respiratory failure (HCC)  Pneumonia due to infectious organism, unspecified laterality, unspecified part of lung  Dehydration   Influenza A  Vomiting without nausea, unspecified vomiting type  Atrial fibrillation with rapid ventricular response (HCC)  AKI (acute kidney injury)  Hypernatremia    ED Discharge Orders     None          Fusi Duwaine LITTIE, DO 05/11/24 1556  "

## 2024-05-11 NOTE — ED Triage Notes (Signed)
 Pt arrives via [facility] from home for one week of lower abd pain and frequent N/V. Pt states that he also has had a cough during that time. Pt A+Ox4

## 2024-05-11 NOTE — Plan of Care (Signed)
   Problem: Education: Goal: Knowledge of General Education information will improve Description Including pain rating scale, medication(s)/side effects and non-pharmacologic comfort measures Outcome: Progressing

## 2024-05-11 NOTE — H&P (Signed)
 " History and Physical    Hector Lowe:[id number] DOB: [birth year] DOA: 05/11/2024  PCP: [name], MD  Patient coming from: Home  I have personally briefly reviewed patient's old medical records in [facility]  Chief Complaint: weakness, cough  HPI: Hector Lowe is a 89 y.o. male with medical history significant for CKD 3, hypertension, gout. Patient was brought to the ED via EMS with reports of 1 week of progressive generalized weakness, sore throat, cough, nausea and vomiting and lower abdominal pain.  Over the past week he has barely had any oral intake, he has had vomiting with every oral intake, and has not been able to stay hydrated.  He has also had cough productive of thick sputum.  No chest pain.  Per family, patient initially declined coming to the ED, today he was unable to get out of bed felt worse and so agreed to come in. Saw his outpatient provider a few days ago, was diagnosed with a sinus infection, started on a course of penicillin.  ED Course: Temperature 98.1.  Heart rate 117-130.  Respirate rate 11-20.  Blood pressure systolic 130s to 859d.  O2 sats down to 88% on room air placed on 2 L, now saturating 90%. CT chest abdomen pelvis without contrast-  Moderate right and small left pleural effusions with partial compressive atelectasis of the lower lobes. Pneumonia is not excluded.  No acute intra-abdominal or pelvic pathology.  No bowel obstruction. 500 mL LR bolus given.  IV ceftriaxone  and azithromycin  started.  Review of Systems: As per HPI all other systems reviewed and negative.  Past Medical History:  Diagnosis Date   Acid reflux    CAD (coronary artery disease) 06/12/2021   CKD (chronic kidney disease) stage 3, GFR 30-59 ml/min (HCC) 03/08/2020   Constipation 03/08/2020   Essential hypertension 03/08/2020   GERD (gastroesophageal reflux disease) 03/08/2020   Gout flare 03/09/2020   Hyperlipidemia LDL goal <70 06/12/2021   Hypertension     Hyponatremia 03/08/2020   Ileus (HCC) 03/07/2020   Lower abdominal pain 09/01/2020   Mild AKI 06/12/2021   Nicotine dependence, chewing tobacco, w oth disorders 03/08/2020   NSTEMI (non-ST elevated myocardial infarction) (HCC) 06/10/2021    Past Surgical History:  Procedure Laterality Date   EYE SURGERY     HERNIA REPAIR     LEFT HEART CATH AND CORONARY ANGIOGRAPHY N/A 06/10/2021   Procedure: LEFT HEART CATH AND CORONARY ANGIOGRAPHY;  Surgeon: Court Dorn JINNY, MD;  Location: [facility];  Service: Cardiovascular;  Laterality: N/A;     reports that he has never smoked. His smokeless tobacco use includes chew. He reports that he does not drink alcohol and does not use drugs.  Allergies[1]  Family History  Problem Relation Age of Onset   Colon cancer Neg Hx     Prior to Admission medications  Medication Sig Start Date End Date Taking? Authorizing Provider  amLODipine  (NORVASC ) 5 MG tablet Take 1 tablet (5 mg total) by mouth daily. 03/12/20   Samtani, Jai-Gurmukh, MD  aspirin  81 MG chewable tablet Chew 1 tablet (81 mg total) by mouth daily. 06/13/21   Goodrich, Callie E, PA-C  atorvastatin  (LIPITOR ) 80 MG tablet Take 1 tablet (80 mg total) by mouth daily. 08/18/22   Jeffrie Oneil BROCKS, MD  isosorbide  mononitrate (IMDUR ) 30 MG 24 hr tablet Take 1 tablet by mouth once daily 09/12/22   Jeffrie Oneil BROCKS, MD  linaclotide  (LINZESS ) 290 MCG CAPS capsule Take  1 capsule (290 mcg total) by mouth daily before breakfast. 10/11/20   Ezzard Sonny RAMAN, PA-C  metoprolol  tartrate (LOPRESSOR ) 25 MG tablet Take 1 tablet (25 mg total) by mouth 2 (two) times daily. 08/18/22   Jeffrie Oneil BROCKS, MD  Multiple Vitamins-Minerals (PRESERVISION AREDS PO) Take 1 capsule by mouth in the morning and at bedtime.    [provider]  nitroGLYCERIN  (NITROSTAT ) 0.4 MG SL tablet DISSOLVE ONE TABLET UNDER THE TONGUE EVERY 5 MINUTES AS NEEDED FOR CHEST PAIN.  DO NOT EXCEED A TOTAL OF 3 DOSES IN 15 MINUTES 02/06/24    Jeffrie Oneil BROCKS, MD  omeprazole  (PRILOSEC) 20 MG capsule Take 1 capsule (20 mg total) by mouth 2 (two) times daily before a meal. 04/30/20   Ezzard Sonny RAMAN, PA-C  polyethylene glycol (MIRALAX  / GLYCOLAX ) 17 g packet Take 17 g by mouth 2 (two) times daily. Patient taking differently: Take 34 g by mouth in the morning. 2 capfuls 03/11/20   Royal Sill, MD    Physical Exam: Vitals:   05/11/24 1211 05/11/24 1215 05/11/24 1300 05/11/24 1457  BP:  (!) 143/96 (!) 143/97 (!) 135/98  Pulse: (!) 123 (!) 130 (!) 121 (!) 115  Resp:  19 20 12   Temp:      TempSrc:      SpO2: 94% 90% 90% 100%    Constitutional: NAD, calm, comfortable Vitals:   05/11/24 1211 05/11/24 1215 05/11/24 1300 05/11/24 1457  BP:  (!) 143/96 (!) 143/97 (!) 135/98  Pulse: (!) 123 (!) 130 (!) 121 (!) 115  Resp:  19 20 12   Temp:      TempSrc:      SpO2: 94% 90% 90% 100%   Eyes: PERRL, lids and conjunctivae normal ENMT: Mucous membranes are markedly dry Neck: normal, supple, no masses, no thyromegaly Respiratory: clear to auscultation bilaterally, no wheezing, no crackles. Normal respiratory effort. No accessory muscle use.  Cardiovascular: Tachycardic, irregular rate and rhythm, no murmurs / rubs / gallops. No extremity edema.  Extremities warm. Abdomen: no tenderness, no masses palpated. No hepatosplenomegaly  Musculoskeletal: no clubbing / cyanosis. No joint deformity upper and lower extremities.  Skin: no rashes, lesions, ulcers. No induration Neurologic: No facial asymmetry, moves EXTR spontaneously, speech fluent Psychiatric: Normal judgment and insight. Alert and oriented x 3. Normal mood.   Labs on Admission: I have personally reviewed following labs and imaging studies  CBC: Recent Labs  Lab 05/11/24 1236  WBC 9.0  NEUTROABS 6.9  HGB 15.6  HCT 52.3*  MCV 101.2*  PLT 161   Basic Metabolic Panel: Recent Labs  Lab 05/11/24 1349  NA 155*  K 4.6  CL 117*  CO2 18*  GLUCOSE 79  BUN 37*   CREATININE 1.88*  CALCIUM  9.4   GFR: CrCl cannot be calculated (Unknown ideal weight.). Liver Function Tests: Recent Labs  Lab 05/11/24 1349  AST 36  ALT 18  ALKPHOS 162*  BILITOT 1.2  PROT 7.5  ALBUMIN 3.6   Recent Labs  Lab 05/11/24 1349  LIPASE 37   Urine analysis:    Component Value Date/Time   COLORURINE AMBER (A) 05/11/2024 1410   APPEARANCEUR HAZY (A) 05/11/2024 1410   LABSPEC 1.023 05/11/2024 1410   PHURINE 5.0 05/11/2024 1410   GLUCOSEU NEGATIVE 05/11/2024 1410   HGBUR SMALL (A) 05/11/2024 1410   BILIRUBINUR NEGATIVE 05/11/2024 1410   KETONESUR 5 (A) 05/11/2024 1410   PROTEINUR 100 (A) 05/11/2024 1410   NITRITE NEGATIVE 05/11/2024 1410   LEUKOCYTESUR  NEGATIVE 05/11/2024 1410    Radiological Exams on Admission: CT CHEST ABDOMEN PELVIS WO CONTRAST Result Date: 05/11/2024 CLINICAL DATA:  Pneumonia. EXAM: CT CHEST, ABDOMEN AND PELVIS WITHOUT CONTRAST TECHNIQUE: Multidetector CT imaging of the chest, abdomen and pelvis was performed following the standard protocol without IV contrast. RADIATION DOSE REDUCTION: This exam was performed according to the departmental dose-optimization program which includes automated exposure control, adjustment of the mA and/or kV according to patient size and/or use of iterative reconstruction technique. COMPARISON:  Chest radiograph dated 05/11/2024. FINDINGS: Evaluation of this exam is limited in the absence of intravenous contrast as well as due to respiratory motion. CT CHEST FINDINGS Cardiovascular: . there is no cardiomegaly. Mild dilatation of the right atrium. There is coronary vascular calcification. Moderate atherosclerotic calcification of the thoracic aorta. No intimal dilatation. The central pulmonary arteries are grossly unremarkable. Mediastinum/Nodes: No hilar or mediastinal adenopathy. The esophagus is grossly unremarkable. No mediastinal fluid collection. Lungs/Pleura: Moderate right and small left pleural effusions.  There is partial compressive atelectasis of the lower lobes. Pneumonia is not excluded. There is diffuse interstitial prominence predominantly involving the lower lungs suggestive of edema. No pneumothorax. The central airways are patent. Musculoskeletal: Osteopenia with degenerative changes of the spine. No acute osseous pathology. CT ABDOMEN PELVIS FINDINGS No intra-abdominal free air or free fluid. Hepatobiliary: The liver is unremarkable. No biliary dilatation. No calcified gallstone. Pancreas: Moderate atrophy of the pancreas. No active inflammatory changes. No dilatation of the main pancreatic duct. Spleen: Normal in size without focal abnormality. Adrenals/Urinary Tract: The adrenal glands unremarkable. There is no hydronephrosis or nephrolithiasis on either side. There is trabeculated appearance of the bladder wall consistent with chronic bladder outlet obstruction. Stomach/Bowel: There is no bowel obstruction or active inflammation. The appendix is normal. Vascular/Lymphatic: Advanced aortoiliac atherosclerotic disease. The IVC is unremarkable. No portal venous gas. There is no adenopathy. Reproductive: The prostate is grossly unremarkable. Other: None Musculoskeletal: Osteopenia with degenerative changes of the spine. No acute osseous pathology. IMPRESSION: 1. Moderate right and small left pleural effusions with partial compressive atelectasis of the lower lobes. Pneumonia is not excluded. 2. No acute intra-abdominal or pelvic pathology. No bowel obstruction. Normal appendix. 3.  Aortic Atherosclerosis (ICD10-I70.0). Electronically Signed   By: Vanetta Chou M.D.   On: 05/11/2024 15:19   DG Chest 1 View Result Date: 05/11/2024 EXAM: 1 VIEW(S) XRAY OF THE CHEST 05/11/2024 12:49:00 PM COMPARISON: AP radiograph of the chest dated 06/23/2021. CLINICAL HISTORY: SOB FINDINGS: LUNGS AND PLEURA: Diffuse peripheral predominant interstitial opacities. Trace bilateral pleural effusions. No pneumothorax. HEART  AND MEDIASTINUM: Mild cardiomegaly. Aortic atherosclerosis. BONES AND SOFT TISSUES: Degenerative changes of the left shoulder. IMPRESSION: 1. Diffuse peripheral predominant interstitial opacities and trace bilateral pleural effusions. 2. Mild cardiomegaly and aortic atherosclerosis. Electronically signed by: Evalene Coho MD 05/11/2024 12:53 PM EST RP Workstation: [id number]    EKG: Independently reviewed.  Atrial fibrillation, rate 121, QTc 416.  Assessment/Plan Principal Problem:   Hypernatremia Active Problems:   Influenza A   Acute hypoxic respiratory failure (HCC)   Atrial fibrillation with RVR (HCC)   Assessment and Plan:  Influenza A infection, possible pneumonia-presenting with hypoxia, nausea vomiting.  CT chest shows moderate right and small left pleural effusions with partial compressive atelectasis of the lower lobes, pneumonia not excluded.  Not quite meeting sepsis criteria, tachycardic, heart rates 117-130.  With lactic acidosis which may be due to dehydration. - Tamiflu  - Procalcitonin borderline at 0.25, will continue with IV antibiotics considering advanced age and  comorbidities - IV ceftriaxone  and azithromycin  - Mucolytics - Zofran  as needed - Follow-up blood cultures  Acute hypoxic respiratory failure-O2 sats down to 88% on room air, placed on 2 L sats greater than 90%.  Likely due to influenza infection, and possible pneumonia.  Lung exam is clear.  New onset atrial fibrillation with RVR- in the setting of influenza infection, severe dehydration and hypoxia.  Heart rate 117-130.  No history of A-fib.  Troponin 65 x 2.  Potassium 4.6. - Check TSH, magnesium - IV metoprolol  5 mg Q 5 minutes x 2 doses -Resume metoprolol  25 mg twice daily. - Follow-up on anticoagulation at this time - Echocardiogram - Continue aspirin  81 mg daily  Hypernatremia, Dehydration, Lactic acidosis-sodium 155.  Likely 2/2 poor oral intake, vomiting, influenza infection.  Lactic acidosis  likely secondary to severe dehydration.  Calculated fluid deficit 1.3 L. - 500 mL bolus given, give additional 500 bolus -  Continue 1/2 N/s 75 cc/h X 1 day - Trend lactic acid  CKD 3-creatinine 1.88, baseline 1.4-1.8.  Hypertension-stable.  Has not taken his medications in about a week. -Hold Norvasc , Imdur  - Resume metoprolol  25 mg twice daily    DVT prophylaxis: Heparin  Code Status: DNR, in the bedside DNR form at bedside, also confirmed status with family at bedside. Family Communication: Son Engineering Geologist and his wife at bedside Disposition Plan: > 2 days Consults called: None  Admission status: Inpt tele I certify that at the point of admission it is my clinical judgment that the patient will require inpatient hospital care spanning beyond 2 midnights from the point of admission due to high intensity of service, high risk for further deterioration and high frequency of surveillance required.    CRITICAL CARE Performed by: Tully FORBES Carwin   Total critical care time: 60 minutes  Critical care time was exclusive of separately billable procedures and treating other patients.  Critical care was necessary to treat or prevent imminent or life-threatening deterioration.  Critical care was time spent personally by me on the following activities: development of treatment plan with patient and/or surrogate as well as nursing, discussions with consultants, evaluation of patient's response to treatment, examination of patient, obtaining history from patient or surrogate, ordering and performing treatments and interventions, ordering and review of laboratory studies, ordering and review of radiographic studies, pulse oximetry and re-evaluation of patient's condition.    Author: Tully FORBES Carwin, MD 05/11/2024 3:36 PM  For on call review [email or web address].     [1] No Known Allergies  "

## 2024-05-12 ENCOUNTER — Inpatient Hospital Stay (HOSPITAL_COMMUNITY)

## 2024-05-12 ENCOUNTER — Other Ambulatory Visit (HOSPITAL_COMMUNITY): Payer: Self-pay | Admitting: *Deleted

## 2024-05-12 DIAGNOSIS — J9601 Acute respiratory failure with hypoxia: Secondary | ICD-10-CM | POA: Diagnosis not present

## 2024-05-12 DIAGNOSIS — J189 Pneumonia, unspecified organism: Secondary | ICD-10-CM | POA: Diagnosis not present

## 2024-05-12 DIAGNOSIS — E870 Hyperosmolality and hypernatremia: Secondary | ICD-10-CM | POA: Diagnosis not present

## 2024-05-12 DIAGNOSIS — J101 Influenza due to other identified influenza virus with other respiratory manifestations: Secondary | ICD-10-CM | POA: Diagnosis not present

## 2024-05-12 DIAGNOSIS — I4891 Unspecified atrial fibrillation: Secondary | ICD-10-CM | POA: Diagnosis not present

## 2024-05-12 DIAGNOSIS — E860 Dehydration: Secondary | ICD-10-CM | POA: Diagnosis not present

## 2024-05-12 LAB — ECHOCARDIOGRAM COMPLETE
AR max vel: 0.94 cm2
AV Area VTI: 1.13 cm2
AV Area mean vel: 1.11 cm2
AV Mean grad: 3 mmHg
AV Peak grad: 6.9 mmHg
Ao pk vel: 1.31 m/s
Area-P 1/2: 4.8 cm2
Height: 70 in
MV M vel: 5.43 m/s
MV Peak grad: 117.9 mmHg
Radius: 0.8 cm
S' Lateral: 2.2 cm
Weight: 1636.7 [oz_av]

## 2024-05-12 MED ORDER — ORAL CARE MOUTH RINSE
15.0000 mL | OROMUCOSAL | Status: DC
  Administered 2024-05-12 – 2024-05-23 (×35): 15 mL via OROMUCOSAL

## 2024-05-12 MED ORDER — ORAL CARE MOUTH RINSE: 15.0000 mL | OROMUCOSAL | Status: DC | PRN

## 2024-05-12 NOTE — Plan of Care (Signed)
" °  Problem: Education: Goal: Knowledge of General Education information will improve Description: Including pain rating scale, medication(s)/side effects and non-pharmacologic comfort measures Outcome: Progressing   Problem: Health Behavior/Discharge Planning: Goal: Ability to manage health-related needs will improve Outcome: Progressing   Problem: Clinical Measurements: Goal: Ability to maintain clinical measurements within normal limits will improve Outcome: Progressing Goal: Will remain free from infection Outcome: Progressing Goal: Diagnostic test results will improve Outcome: Progressing Goal: Respiratory complications will improve Outcome: Progressing Goal: Cardiovascular complication will be avoided Outcome: Not Applicable   Problem: Activity: Goal: Risk for activity intolerance will decrease Outcome: Progressing   Problem: Nutrition: Goal: Adequate nutrition will be maintained Outcome: Progressing   Problem: Coping: Goal: Level of anxiety will decrease Outcome: Progressing   Problem: Elimination: Goal: Will not experience complications related to bowel motility Outcome: Not Applicable   Problem: Pain Managment: Goal: General experience of comfort will improve and/or be controlled Outcome: Not Applicable   Problem: Safety: Goal: Ability to remain free from injury will improve Outcome: Progressing   Problem: Skin Integrity: Goal: Risk for impaired skin integrity will decrease Outcome: Progressing   Problem: Activity: Goal: Ability to tolerate increased activity will improve Outcome: Progressing   Problem: Clinical Measurements: Goal: Ability to maintain a body temperature in the normal range will improve Outcome: Progressing   Problem: Respiratory: Goal: Ability to maintain adequate ventilation will improve Outcome: Progressing Goal: Ability to maintain a clear airway will improve Outcome: Progressing   Problem: Education: Goal: Knowledge of  disease or condition will improve Outcome: Progressing Goal: Understanding of medication regimen will improve Outcome: Progressing   Problem: Activity: Goal: Ability to tolerate increased activity will improve Outcome: Progressing   Problem: Cardiac: Goal: Ability to achieve and maintain adequate cardiopulmonary perfusion will improve Outcome: Progressing   Problem: Health Behavior/Discharge Planning: Goal: Ability to safely manage health-related needs after discharge will improve Outcome: Not Applicable   "

## 2024-05-12 NOTE — Plan of Care (Signed)
   Problem: Activity: Goal: Risk for activity intolerance will decrease Outcome: Progressing   Problem: Nutrition: Goal: Adequate nutrition will be maintained Outcome: Progressing   Problem: Safety: Goal: Ability to remain free from injury will improve Outcome: Progressing

## 2024-05-12 NOTE — Progress Notes (Signed)
*  PRELIMINARY RESULTS* Echocardiogram 2D Echocardiogram has been performed.  Teresa Aida PARAS 05/12/2024, 3:54 PM

## 2024-05-12 NOTE — Progress Notes (Signed)
 Transition of Care Department [facility]) has reviewed patient and no other TOC needs have been identified at this time. We will continue to monitor patient advancement through interdisciplinary progression rounds. If new patient transition needs arise, please place a TOC consult.   05/12/24 0821  TOC Brief Assessment  Insurance and Status Reviewed  Patient has primary care physician Yes  Home environment has been reviewed Lives alone.  Prior level of function: Fairly independent prior to admission. Family very involved.  Prior/Current Home Services No current home services  Social Drivers of Health Review SDOH reviewed no interventions necessary  Readmission risk has been reviewed Yes  Transition of care needs no transition of care needs at this time

## 2024-05-12 NOTE — Progress Notes (Signed)
 " [name]  FMW:[id number] DOB: Jul 03, 1921 DOA: 05/11/2024  PCP: [name], MD   Chief Complaint  Patient presents with   Abdominal Pain    Brief Narrative:  As per H&P written by Dr. Pearlean on 05/11/2024 Hector Lowe is a 89 y.o. male with medical history significant for CKD 3, hypertension, gout. Patient was brought to the ED via EMS with reports of 1 week of progressive generalized weakness, sore throat, cough, nausea and vomiting and lower abdominal pain.  Over the past week he has barely had any oral intake, he has had vomiting with every oral intake, and has not been able to stay hydrated.  He has also had cough productive of thick sputum.  No chest pain.  Per family, patient initially declined coming to the ED, today he was unable to get out of bed felt worse and so agreed to come in. Saw his outpatient provider a few days ago, was diagnosed with a sinus infection, started on a course of penicillin.   ED Course: Temperature 98.1.  Heart rate 117-130.  Respirate rate 11-20.  Blood pressure systolic 130s to 859d.  O2 sats down to 88% on room air placed on 2 L, now saturating 90%. CT chest abdomen pelvis without contrast-  Moderate right and small left pleural effusions with partial compressive atelectasis of the lower lobes. Pneumonia is not excluded.  No acute intra-abdominal or pelvic pathology.  No bowel obstruction. 500 mL LR bolus given.  IV ceftriaxone  and azithromycin  started.  Assessment & Plan: 1-influenza A infection with presumed superimposed pneumonia - Continue treatment with Tamiflu  - Continue IV antibiotics - Continue mucolytics - Wean off oxygen supplementation as tolerated - Continue supportive care - Follow clinical response. - Will maintain adequate hydration.  2-acute hypoxic respiratory failure - Secondary to provide #1 - Continue to wean off oxygen supplementation as tolerated.  3-new onset atrial fibrillation with RVR  at time of admission - Excellent response to initial treatment using IV Lopressor  - Continue oral metoprolol  - Follow-up 2D echo - Continue aspirin  for secondary prevention - Follow electrolytes and maintain magnesium above 2 and potassium above 4.  4-chronic kidney disease stage IIIb - Appears to be close to baseline - Continue to maintain adequate hydration - Minimize nephrotoxic agents - Follow renal function trend.  5-hypertension - Stable for the most part - Patient has not been taking medication for about a week prior to admission - Continue the use of metoprolol  for now - Holding Norvasc  and Imdur  for now - Follow vital signs.  6-severe protein calorie malnutrition -Body mass index is 14.68 kg/m.  -For now using liquid diet - Speech therapy has been contacted - Planning to start feeding supplements. - Continue supportive care.   DVT prophylaxis: Heparin  Code Status: DNR/DNI. Family Communication: No family at bedside. Disposition:   Status is: Inpatient Remains inpatient appropriate because: Continue IV therapy.   Consultants:  None   Procedures:  See below for x-ray reports 2D echo:  Antimicrobials:  Rocephin  and Zithromax .   Subjective: In no major distress; patient denies chest pain, nausea, vomiting and is otherwise breathing better.  Good saturation on 2 L.  Objective: Vitals:   05/11/24 2112 05/12/24 0130 05/12/24 0434 05/12/24 1328  BP: 120/82 115/73 124/87 111/76  Pulse: 96 91 87 94  Resp: 17 18 18 16   Temp: 98.1 F (36.7 C) (!) 97.4 F (36.3 C) 97.8 F (36.6 C) 97.8 F (36.6 C)  TempSrc: Oral Oral Oral   SpO2: 100% 99%  97%  Weight:      Height:        Intake/Output Summary (Last 24 hours) at 05/12/2024 1733 Last data filed at 05/12/2024 1300 Gross per 24 hour  Intake 720 ml  Output --  Net 720 ml   Filed Weights   05/11/24 1710  Weight: 46.4 kg    Examination:  General exam: Appears calm and in no major distress;  chronically ill in appearance.  Hard of hearing. Respiratory system: Good saturation on 2 L supplementation; no using accessory muscle. Cardiovascular system: Rate controlled, no rubs, no gallops, no JVD. Gastrointestinal system: Abdomen is nondistended, soft and nontender. No organomegaly or masses felt. Normal bowel sounds heard. Central nervous system: Generally weak.  Moving 4 limbs spontaneously.  No focal neurological deficits. Extremities: No cyanosis or clubbing. Skin: No petechiae. Psychiatry: Judgement and insight appear stable; normal affect.  Data Reviewed: I have personally reviewed following labs and imaging studies  CBC: Recent Labs  Lab 05/11/24 1236  WBC 9.0  NEUTROABS 6.9  HGB 15.6  HCT 52.3*  MCV 101.2*  PLT 161    Basic Metabolic Panel: Recent Labs  Lab 05/11/24 1349 05/11/24 1434  NA 155*  --   K 4.6  --   CL 117*  --   CO2 18*  --   GLUCOSE 79  --   BUN 37*  --   CREATININE 1.88*  --   CALCIUM  9.4  --   MG  --  2.3    GFR: Estimated Creatinine Clearance: 13.7 mL/min (A) (by C-G formula based on SCr of 1.88 mg/dL (H)).  Liver Function Tests: Recent Labs  Lab 05/11/24 1349  AST 36  ALT 18  ALKPHOS 162*  BILITOT 1.2  PROT 7.5  ALBUMIN 3.6    CBG: Recent Labs  Lab 05/11/24 2001  GLUCAP 99     Recent Results (from the past 240 hours)  Resp panel by RT-PCR (RSV, Flu A&B, Covid) Anterior Nasal Swab     Status: Abnormal   Collection Time: 05/11/24 12:36 PM   Specimen: Anterior Nasal Swab  Result Value Ref Range Status   SARS Coronavirus 2 by RT PCR NEGATIVE NEGATIVE Final    Comment: (NOTE) SARS-CoV-2 target nucleic acids are NOT DETECTED.  The SARS-CoV-2 RNA is generally detectable in upper respiratory specimens during the acute phase of infection. The lowest concentration of SARS-CoV-2 viral copies this assay can detect is 138 copies/mL. A negative result does not preclude SARS-Cov-2 infection and should not be used as the  sole basis for treatment or other patient management decisions. A negative result may occur with  improper specimen collection/handling, submission of specimen other than nasopharyngeal swab, presence of viral mutation(s) within the areas targeted by this assay, and inadequate number of viral copies(<138 copies/mL). A negative result must be combined with clinical observations, patient history, and epidemiological information. The expected result is Negative.  Fact Sheet for Patients:  bloggercourse.com  Fact Sheet for Healthcare Providers:  seriousbroker.it  This test is no t yet approved or cleared by the [place]  FDA and  has been authorized for detection and/or diagnosis of SARS-CoV-2 by FDA under an Emergency Use Authorization (EUA). This EUA will remain  in effect (meaning this test can be used) for the duration of the COVID-19 declaration under Section 564(b)(1) of the Act, 21 U.S.C.section 360bbb-3(b)(1), unless the authorization is terminated  or revoked sooner.  Influenza A by PCR POSITIVE (A) NEGATIVE Final   Influenza B by PCR NEGATIVE NEGATIVE Final    Comment: (NOTE) The Xpert Xpress SARS-CoV-2/FLU/RSV plus assay is intended as an aid in the diagnosis of influenza from Nasopharyngeal swab specimens and should not be used as a sole basis for treatment. Nasal washings and aspirates are unacceptable for Xpert Xpress SARS-CoV-2/FLU/RSV testing.  Fact Sheet for Patients: bloggercourse.com  Fact Sheet for Healthcare Providers: seriousbroker.it  This test is not yet approved or cleared by the [place]  FDA and has been authorized for detection and/or diagnosis of SARS-CoV-2 by FDA under an Emergency Use Authorization (EUA). This EUA will remain in effect (meaning this test can be used) for the duration of the COVID-19 declaration under Section 564(b)(1) of  the Act, 21 U.S.C. section 360bbb-3(b)(1), unless the authorization is terminated or revoked.     Resp Syncytial Virus by PCR NEGATIVE NEGATIVE Final    Comment: (NOTE) Fact Sheet for Patients: bloggercourse.com  Fact Sheet for Healthcare Providers: seriousbroker.it  This test is not yet approved or cleared by the [place]  FDA and has been authorized for detection and/or diagnosis of SARS-CoV-2 by FDA under an Emergency Use Authorization (EUA). This EUA will remain in effect (meaning this test can be used) for the duration of the COVID-19 declaration under Section 564(b)(1) of the Act, 21 U.S.C. section 360bbb-3(b)(1), unless the authorization is terminated or revoked.  Performed at [facility], [address] [place], [place] [address]   Culture, blood (routine x 2)     Status: None (Preliminary result)   Collection Time: 05/11/24  4:22 PM   Specimen: BLOOD LEFT FOREARM  Result Value Ref Range Status   Specimen Description BLOOD LEFT FOREARM AEROBIC BOTTLE ONLY  Final   Special Requests   Final    Blood Culture results may not be optimal due to an inadequate volume of blood received in culture bottles   Culture   Final    NO GROWTH < 24 HOURS Performed at [facility], [address], [place], [place] [address]    Report Status PENDING  Incomplete  Culture, blood (routine x 2)     Status: None (Preliminary result)   Collection Time: 05/11/24  4:22 PM   Specimen: Left Antecubital; Blood  Result Value Ref Range Status   Specimen Description   Final    LEFT ANTECUBITAL BOTTLES DRAWN AEROBIC AND ANAEROBIC   Special Requests   Final    Blood Culture results may not be optimal due to an inadequate volume of blood received in culture bottles   Culture   Final    NO GROWTH < 24 HOURS Performed at [facility], [address], [place], [place] [address]    Report Status PENDING  Incomplete    Radiology  Studies: ECHOCARDIOGRAM COMPLETE Result Date: 05/12/2024    ECHOCARDIOGRAM REPORT   Patient Name:   VIGGO PERKO [facility] Date of Exam: 05/12/2024 Medical Rec #:  [id number]      Height:       70.0 in Accession #:    [id number]     Weight:       102.3 lb Date of Birth:  29-Oct-1921     BSA:          1.570 m Patient Age:    89 years      BP:           124/87 mmHg Patient Gender: M  HR:           87 bpm. Exam Location:  [facility] Procedure: 2D Echo, Cardiac Doppler and Color Doppler (Both Spectral and Color            Flow Doppler were utilized during procedure). Indications:    Atrial Fibrillation I48.91  History:        Patient has prior history of Echocardiogram examinations. CAD                 and Previous Myocardial Infarction, Arrythmias:Atrial                 Fibrillation; Risk Factors:Hypertension and Dyslipidemia.  Sonographer:    Aida Pizza RCS Referring Phys: [phone] EJIROGHENE E EMOKPAE IMPRESSIONS  1. Left ventricular ejection fraction, by estimation, is 60 to 65%. The left ventricle has normal function. The left ventricle has no regional wall motion abnormalities. There is severe asymmetric left ventricular hypertrophy of the septal segment. Left  ventricular diastolic function could not be evaluated.  2. Right ventricular systolic function is normal. The right ventricular size is normal. There is mildly elevated pulmonary artery systolic pressure. The estimated right ventricular systolic pressure is 42.5 mmHg.  3. Left atrial size was mild to moderately dilated.  4. Right atrial size was severely dilated.  5. The mitral valve is normal in structure. Mild to moderate mitral valve regurgitation. No evidence of mitral stenosis.  6. The tricuspid valve is abnormal. Tricuspid valve regurgitation is moderate to severe.  7. The aortic valve is tricuspid. There is moderate calcification of the aortic valve. There is moderate thickening of the aortic valve. Aortic valve regurgitation is not visualized.  No aortic stenosis is present. Aortic valve mean gradient measures 3.0  mmHg.  8. The inferior vena cava is dilated in size with <50% respiratory variability, suggesting right atrial pressure of 15 mmHg. Comparison(s): No significant change from prior study. FINDINGS  Left Ventricle: Left ventricular ejection fraction, by estimation, is 60 to 65%. The left ventricle has normal function. The left ventricle has no regional wall motion abnormalities. Strain was performed and the global longitudinal strain is indeterminate. The left ventricular internal cavity size was normal in size. There is severe asymmetric left ventricular hypertrophy of the septal segment. Left ventricular diastolic function could not be evaluated due to atrial fibrillation. Left ventricular diastolic function could not be evaluated. Right Ventricle: The right ventricular size is normal. No increase in right ventricular wall thickness. Right ventricular systolic function is normal. There is mildly elevated pulmonary artery systolic pressure. The tricuspid regurgitant velocity is 2.62  m/s, and with an assumed right atrial pressure of 15 mmHg, the estimated right ventricular systolic pressure is 42.5 mmHg. Left Atrium: Left atrial size was mild to moderately dilated. Right Atrium: Right atrial size was severely dilated. Pericardium: There is no evidence of pericardial effusion. Mitral Valve: The mitral valve is normal in structure. Mild to moderate mitral valve regurgitation. No evidence of mitral valve stenosis. Tricuspid Valve: The tricuspid valve is abnormal. Tricuspid valve regurgitation is moderate to severe. No evidence of tricuspid stenosis. Aortic Valve: The aortic valve is tricuspid. There is moderate calcification of the aortic valve. There is moderate thickening of the aortic valve. Aortic valve regurgitation is not visualized. No aortic stenosis is present. Aortic valve mean gradient measures 3.0 mmHg. Aortic valve peak gradient  measures 6.9 mmHg. Aortic valve area, by VTI measures 1.13 cm. Pulmonic Valve: The pulmonic valve was normal in structure. Pulmonic valve regurgitation  is mild to moderate. No evidence of pulmonic stenosis. Aorta: The aortic root is normal in size and structure. Venous: The inferior vena cava is dilated in size with less than 50% respiratory variability, suggesting right atrial pressure of 15 mmHg. IAS/Shunts: No atrial level shunt detected by color flow Doppler. Additional Comments: 3D was performed not requiring image post processing on an independent workstation and was indeterminate.  LEFT VENTRICLE PLAX 2D LVIDd:         3.10 cm LVIDs:         2.20 cm LV PW:         1.00 cm LV IVS:        1.50 cm LVOT diam:     1.70 cm LV SV:         24 LV SV Index:   15 LVOT Area:     2.27 cm  RIGHT VENTRICLE RV S prime:     9.20 cm/s TAPSE (M-mode): 1.1 cm LEFT ATRIUM             Index        RIGHT ATRIUM           Index LA diam:        3.10 cm 1.97 cm/m   Lowe Area:     31.40 cm LA Vol (A2C):   60.6 ml 38.60 ml/m  Lowe Volume:   117.00 ml 74.53 ml/m LA Vol (A4C):   61.5 ml 39.18 ml/m LA Biplane Vol: 60.3 ml 38.41 ml/m  AORTIC VALVE AV Area (Vmax):    0.94 cm AV Area (Vmean):   1.11 cm AV Area (VTI):     1.13 cm AV Vmax:           131.00 cm/s AV Vmean:          75.400 cm/s AV VTI:            0.211 m AV Peak Grad:      6.9 mmHg AV Mean Grad:      3.0 mmHg LVOT Vmax:         54.00 cm/s LVOT Vmean:        36.900 cm/s LVOT VTI:          0.105 m LVOT/AV VTI ratio: 0.50  AORTA Ao Root diam: 3.20 cm MITRAL VALVE                  TRICUSPID VALVE MV Area (PHT): 4.80 cm       TR Peak grad:   27.5 mmHg MV Decel Time: 158 msec       TR Vmax:        262.00 cm/s MR Peak grad:    117.9 mmHg MR Mean grad:    74.0 mmHg    SHUNTS MR Vmax:         543.00 cm/s  Systemic VTI:  0.10 m MR Vmean:        399.0 cm/s   Systemic Diam: 1.70 cm MR PISA:         4.02 cm MR PISA Eff ROA: 16 mm MR PISA Radius:  0.80 cm MV E velocity: 110.00 cm/s  Vishnu Priya Mallipeddi Electronically signed by Diannah Late Mallipeddi Signature Date/Time: 05/12/2024/3:59:04 PM    Final    CT CHEST ABDOMEN PELVIS WO CONTRAST Result Date: 05/11/2024 CLINICAL DATA:  Pneumonia. EXAM: CT CHEST, ABDOMEN AND PELVIS WITHOUT CONTRAST TECHNIQUE: Multidetector CT imaging of the chest, abdomen and pelvis was performed following the standard protocol without IV  contrast. RADIATION DOSE REDUCTION: This exam was performed according to the departmental dose-optimization program which includes automated exposure control, adjustment of the mA and/or kV according to patient size and/or use of iterative reconstruction technique. COMPARISON:  Chest radiograph dated 05/11/2024. FINDINGS: Evaluation of this exam is limited in the absence of intravenous contrast as well as due to respiratory motion. CT CHEST FINDINGS Cardiovascular: . there is no cardiomegaly. Mild dilatation of the right atrium. There is coronary vascular calcification. Moderate atherosclerotic calcification of the thoracic aorta. No intimal dilatation. The central pulmonary arteries are grossly unremarkable. Mediastinum/Nodes: No hilar or mediastinal adenopathy. The esophagus is grossly unremarkable. No mediastinal fluid collection. Lungs/Pleura: Moderate right and small left pleural effusions. There is partial compressive atelectasis of the lower lobes. Pneumonia is not excluded. There is diffuse interstitial prominence predominantly involving the lower lungs suggestive of edema. No pneumothorax. The central airways are patent. Musculoskeletal: Osteopenia with degenerative changes of the spine. No acute osseous pathology. CT ABDOMEN PELVIS FINDINGS No intra-abdominal free air or free fluid. Hepatobiliary: The liver is unremarkable. No biliary dilatation. No calcified gallstone. Pancreas: Moderate atrophy of the pancreas. No active inflammatory changes. No dilatation of the main pancreatic duct. Spleen: Normal in size without  focal abnormality. Adrenals/Urinary Tract: The adrenal glands unremarkable. There is no hydronephrosis or nephrolithiasis on either side. There is trabeculated appearance of the bladder wall consistent with chronic bladder outlet obstruction. Stomach/Bowel: There is no bowel obstruction or active inflammation. The appendix is normal. Vascular/Lymphatic: Advanced aortoiliac atherosclerotic disease. The IVC is unremarkable. No portal venous gas. There is no adenopathy. Reproductive: The prostate is grossly unremarkable. Other: None Musculoskeletal: Osteopenia with degenerative changes of the spine. No acute osseous pathology. IMPRESSION: 1. Moderate right and small left pleural effusions with partial compressive atelectasis of the lower lobes. Pneumonia is not excluded. 2. No acute intra-abdominal or pelvic pathology. No bowel obstruction. Normal appendix. 3.  Aortic Atherosclerosis (ICD10-I70.0). Electronically Signed   By: Vanetta Chou M.D.   On: 05/11/2024 15:19   DG Chest 1 View Result Date: 05/11/2024 EXAM: 1 VIEW(S) XRAY OF THE CHEST 05/11/2024 12:49:00 PM COMPARISON: AP radiograph of the chest dated 06/23/2021. CLINICAL HISTORY: SOB FINDINGS: LUNGS AND PLEURA: Diffuse peripheral predominant interstitial opacities. Trace bilateral pleural effusions. No pneumothorax. HEART AND MEDIASTINUM: Mild cardiomegaly. Aortic atherosclerosis. BONES AND SOFT TISSUES: Degenerative changes of the left shoulder. IMPRESSION: 1. Diffuse peripheral predominant interstitial opacities and trace bilateral pleural effusions. 2. Mild cardiomegaly and aortic atherosclerosis. Electronically signed by: Evalene Coho MD 05/11/2024 12:53 PM EST RP Workstation: [id number]   Scheduled Meds:  aspirin   81 mg Oral Daily   heparin   5,000 Units Subcutaneous Q8H   metoprolol  tartrate  25 mg Oral BID   oseltamivir   30 mg Oral Daily   Continuous Infusions:  sodium chloride  75 mL/hr at 05/12/24 0830   azithromycin  500 mg (05/11/24  1610)   cefTRIAXone  (ROCEPHIN )  IV 2 g (05/12/24 1558)     LOS: 1 day    Time spent: 50 minutes    Eric Nunnery, MD [facility]   To contact the attending provider between 7A-7P or the covering provider during after hours 7P-7A, please log into the web site [email or web address] and access using universal [place] password for that web site. If you do not have the password, please call the hospital operator.  05/12/2024, 5:33 PM    "

## 2024-05-13 DIAGNOSIS — J101 Influenza due to other identified influenza virus with other respiratory manifestations: Secondary | ICD-10-CM | POA: Diagnosis not present

## 2024-05-13 DIAGNOSIS — E870 Hyperosmolality and hypernatremia: Secondary | ICD-10-CM | POA: Diagnosis not present

## 2024-05-13 DIAGNOSIS — J9601 Acute respiratory failure with hypoxia: Secondary | ICD-10-CM | POA: Diagnosis not present

## 2024-05-13 DIAGNOSIS — I4891 Unspecified atrial fibrillation: Secondary | ICD-10-CM | POA: Diagnosis not present

## 2024-05-13 LAB — CBC
HCT: 40.4 % (ref 39.0–52.0)
Hemoglobin: 12.3 g/dL — ABNORMAL LOW (ref 13.0–17.0)
MCH: 30.4 pg (ref 26.0–34.0)
MCHC: 30.4 g/dL (ref 30.0–36.0)
MCV: 100 fL (ref 80.0–100.0)
Platelets: 128 K/uL — ABNORMAL LOW (ref 150–400)
RBC: 4.04 MIL/uL — ABNORMAL LOW (ref 4.22–5.81)
RDW: 17 % — ABNORMAL HIGH (ref 11.5–15.5)
WBC: 8.1 K/uL (ref 4.0–10.5)
nRBC: 0 % (ref 0.0–0.2)

## 2024-05-13 LAB — GLUCOSE, CAPILLARY
Glucose-Capillary: 119 mg/dL — ABNORMAL HIGH (ref 70–99)
Glucose-Capillary: 52 mg/dL — ABNORMAL LOW (ref 70–99)
Glucose-Capillary: 56 mg/dL — ABNORMAL LOW (ref 70–99)
Glucose-Capillary: 64 mg/dL — ABNORMAL LOW (ref 70–99)
Glucose-Capillary: 69 mg/dL — ABNORMAL LOW (ref 70–99)
Glucose-Capillary: 97 mg/dL (ref 70–99)

## 2024-05-13 LAB — BASIC METABOLIC PANEL WITH GFR
Anion gap: 15 (ref 5–15)
BUN: 47 mg/dL — ABNORMAL HIGH (ref 8–23)
CO2: 16 mmol/L — ABNORMAL LOW (ref 22–32)
Calcium: 8.3 mg/dL — ABNORMAL LOW (ref 8.9–10.3)
Chloride: 117 mmol/L — ABNORMAL HIGH (ref 98–111)
Creatinine, Ser: 1.87 mg/dL — ABNORMAL HIGH (ref 0.61–1.24)
GFR, Estimated: 32 mL/min — ABNORMAL LOW
Glucose, Bld: 65 mg/dL — ABNORMAL LOW (ref 70–99)
Potassium: 4.5 mmol/L (ref 3.5–5.1)
Sodium: 149 mmol/L — ABNORMAL HIGH (ref 135–145)

## 2024-05-13 MED ORDER — ENSURE PLUS HIGH PROTEIN PO LIQD
237.0000 mL | Freq: Two times a day (BID) | ORAL | Status: DC
  Administered 2024-05-14 – 2024-06-03 (×29): 237 mL via ORAL

## 2024-05-13 MED ORDER — DEXTROSE 50 % IV SOLN
INTRAVENOUS | Status: AC
  Filled 2024-05-13: qty 50

## 2024-05-13 MED ORDER — DEXTROSE 50 % IV SOLN
25.0000 g | INTRAVENOUS | Status: AC
  Administered 2024-05-13: 25 g via INTRAVENOUS

## 2024-05-13 MED ORDER — HYDROMORPHONE HCL 1 MG/ML IJ SOLN
0.5000 mg | Freq: Once | INTRAMUSCULAR | Status: AC | PRN
  Administered 2024-05-13: 0.5 mg via INTRAVENOUS
  Filled 2024-05-13: qty 0.5

## 2024-05-13 NOTE — Progress Notes (Signed)
 " [name]  FMW:[id number] DOB: 06-Aug-1921 DOA: 05/11/2024  PCP: [name], MD   Chief Complaint  Patient presents with   Abdominal Pain    Brief Narrative:  As per H&P written by Dr. Pearlean on 05/11/2024 Hector Lowe is a 89 y.o. male with medical history significant for CKD 3, hypertension, gout. Patient was brought to the ED via EMS with reports of 1 week of progressive generalized weakness, sore throat, cough, nausea and vomiting and lower abdominal pain.  Over the past week he has barely had any oral intake, he has had vomiting with every oral intake, and has not been able to stay hydrated.  He has also had cough productive of thick sputum.  No chest pain.  Per family, patient initially declined coming to the ED, today he was unable to get out of bed felt worse and so agreed to come in. Saw his outpatient provider a few days ago, was diagnosed with a sinus infection, started on a course of penicillin.   ED Course: Temperature 98.1.  Heart rate 117-130.  Respirate rate 11-20.  Blood pressure systolic 130s to 859d.  O2 sats down to 88% on room air placed on 2 L, now saturating 90%. CT chest abdomen pelvis without contrast-  Moderate right and small left pleural effusions with partial compressive atelectasis of the lower lobes. Pneumonia is not excluded.  No acute intra-abdominal or pelvic pathology.  No bowel obstruction. 500 mL LR bolus given.  IV ceftriaxone  and azithromycin  started.  Assessment & Plan: 1-influenza A infection with presumed superimposed pneumonia - Continue treatment with Tamiflu  - Continue IV antibiotics for another 24 hours. - Continue mucolytics. - Continue oxygen supplementation and wean off as tolerated. - Continue supportive care - Continue to follow clinical response. - Maintain adequate hydration and nutrition. - Hopefully ready for discharge home on 05/14/2024.  2-acute hypoxic respiratory failure - Secondary to  problem #1 - Desaturation screening demonstrating the need of 2 L nasal cannula supplementation - Will arrange oxygen at discharge.  3-new onset atrial fibrillation with RVR at time of admission - Excellent response to initial treatment using IV Lopressor  - Continue oral metoprolol  - 2D echo demonstrating preserved ejection fraction and no significant valvular disorder. - Continue aspirin  for secondary prevention - Follow electrolytes and maintain magnesium above 2 and potassium above 4. - Telemetry evaluation demonstrating atrial fibrillation.  4-chronic kidney disease stage IIIb - Appears to be close to his baseline - Continue to maintain adequate hydration - Continue minimize nephrotoxic agents - Will continue to follow renal function trend.  5-hypertension - Stable for the most part - Patient has not been taking medication for about a week prior to admission - Continue the use of metoprolol  for now - Continue holding Norvasc  and Imdur  for now; in order to prevent hypotension. - Follow vital signs.  6-severe protein calorie malnutrition -Body mass index is 14.68 kg/m.  -For now using liquid diet - Speech therapy has been contacted - Continue supportive care. - Feeding supplements twice a day between meals have been started.   DVT prophylaxis: Heparin  Code Status: DNR/DNI. Family Communication: No family at bedside. Disposition:   Status is: Inpatient Remains inpatient appropriate because: Continue IV therapy.   Consultants:  None   Procedures:  See below for x-ray reports 2D echo: Demonstrating preserved ejection fraction, no wall motion abnormality.  Ventricular diastolic function could not be evaluated.  No significant valvular disorder appreciated.  Antimicrobials:  Rocephin  and Zithromax .   Subjective: 2 L nasal cannula in place; reported intermittent coughing spells but overall feeling better.  No nausea, no vomiting.  Objective: Vitals:   05/12/24  0434 05/12/24 1328 05/12/24 2015 05/13/24 0337  BP: 124/87 111/76 115/70 109/74  Pulse: 87 94 82 83  Resp: 18 16 19 18   Temp: 97.8 F (36.6 C) 97.8 F (36.6 C) (!) 97.3 F (36.3 C) 97.7 F (36.5 C)  TempSrc: Oral  Oral Oral  SpO2:  97% 98% 96%  Weight:      Height:       No intake or output data in the 24 hours ending 05/13/24 1521  Filed Weights   05/11/24 1710  Weight: 46.4 kg    Examination: General exam: Alert, awake, hard of hearing; able to follow commands and expressed feeling better. Respiratory system: Positive scattered rhonchi; no using accessory muscles.  2 L nasal cannula in place. Cardiovascular system: Rate controlled, no rubs, no gallops, no JVD. Gastrointestinal system: Abdomen is nondistended, soft and nontender. No organomegaly or masses felt. Normal bowel sounds heard. Central nervous system: Moving 4 limbs spontaneously.  No focal neurological deficits. Extremities: No cyanosis or clubbing. Skin: No petechiae. Psychiatry: Judgement and insight appear normal. Mood & affect appropriate.   Data Reviewed: I have personally reviewed following labs and imaging studies  CBC: Recent Labs  Lab 05/11/24 1236 05/13/24 0451  WBC 9.0 8.1  NEUTROABS 6.9  --   HGB 15.6 12.3*  HCT 52.3* 40.4  MCV 101.2* 100.0  PLT 161 128*    Basic Metabolic Panel: Recent Labs  Lab 05/11/24 1349 05/11/24 1434 05/13/24 0451  NA 155*  --  149*  K 4.6  --  4.5  CL 117*  --  117*  CO2 18*  --  16*  GLUCOSE 79  --  65*  BUN 37*  --  47*  CREATININE 1.88*  --  1.87*  CALCIUM  9.4  --  8.3*  MG  --  2.3  --     GFR: Estimated Creatinine Clearance: 13.8 mL/min (A) (by C-G formula based on SCr of 1.87 mg/dL (H)).  Liver Function Tests: Recent Labs  Lab 05/11/24 1349  AST 36  ALT 18  ALKPHOS 162*  BILITOT 1.2  PROT 7.5  ALBUMIN 3.6    CBG: Recent Labs  Lab 05/11/24 2001 05/13/24 0737  GLUCAP 99 56*     Recent Results (from the past 240 hours)  Resp  panel by RT-PCR (RSV, Flu A&B, Covid) Anterior Nasal Swab     Status: Abnormal   Collection Time: 05/11/24 12:36 PM   Specimen: Anterior Nasal Swab  Result Value Ref Range Status   SARS Coronavirus 2 by RT PCR NEGATIVE NEGATIVE Final    Comment: (NOTE) SARS-CoV-2 target nucleic acids are NOT DETECTED.  The SARS-CoV-2 RNA is generally detectable in upper respiratory specimens during the acute phase of infection. The lowest concentration of SARS-CoV-2 viral copies this assay can detect is 138 copies/mL. A negative result does not preclude SARS-Cov-2 infection and should not be used as the sole basis for treatment or other patient management decisions. A negative result may occur with  improper specimen collection/handling, submission of specimen other than nasopharyngeal swab, presence of viral mutation(s) within the areas targeted by this assay, and inadequate number of viral copies(<138 copies/mL). A negative result must be combined with clinical observations, patient history, and epidemiological information. The expected result is Negative.  Fact Sheet for Patients:  bloggercourse.com  Fact Sheet for Healthcare Providers:  seriousbroker.it  This test is no t yet approved or cleared by the [place]  FDA and  has been authorized for detection and/or diagnosis of SARS-CoV-2 by FDA under an Emergency Use Authorization (EUA). This EUA will remain  in effect (meaning this test can be used) for the duration of the COVID-19 declaration under Section 564(b)(1) of the Act, 21 U.S.C.section 360bbb-3(b)(1), unless the authorization is terminated  or revoked sooner.       Influenza A by PCR POSITIVE (A) NEGATIVE Final   Influenza B by PCR NEGATIVE NEGATIVE Final    Comment: (NOTE) The Xpert Xpress SARS-CoV-2/FLU/RSV plus assay is intended as an aid in the diagnosis of influenza from Nasopharyngeal swab specimens and should not be used  as a sole basis for treatment. Nasal washings and aspirates are unacceptable for Xpert Xpress SARS-CoV-2/FLU/RSV testing.  Fact Sheet for Patients: bloggercourse.com  Fact Sheet for Healthcare Providers: seriousbroker.it  This test is not yet approved or cleared by the [place]  FDA and has been authorized for detection and/or diagnosis of SARS-CoV-2 by FDA under an Emergency Use Authorization (EUA). This EUA will remain in effect (meaning this test can be used) for the duration of the COVID-19 declaration under Section 564(b)(1) of the Act, 21 U.S.C. section 360bbb-3(b)(1), unless the authorization is terminated or revoked.     Resp Syncytial Virus by PCR NEGATIVE NEGATIVE Final    Comment: (NOTE) Fact Sheet for Patients: bloggercourse.com  Fact Sheet for Healthcare Providers: seriousbroker.it  This test is not yet approved or cleared by the [place]  FDA and has been authorized for detection and/or diagnosis of SARS-CoV-2 by FDA under an Emergency Use Authorization (EUA). This EUA will remain in effect (meaning this test can be used) for the duration of the COVID-19 declaration under Section 564(b)(1) of the Act, 21 U.S.C. section 360bbb-3(b)(1), unless the authorization is terminated or revoked.  Performed at [facility], [address], [place], [place] [address]   Culture, blood (routine x 2)     Status: None (Preliminary result)   Collection Time: 05/11/24  4:22 PM   Specimen: BLOOD LEFT FOREARM  Result Value Ref Range Status   Specimen Description BLOOD LEFT FOREARM AEROBIC BOTTLE ONLY  Final   Special Requests   Final    Blood Culture results may not be optimal due to an inadequate volume of blood received in culture bottles   Culture   Final    NO GROWTH 2 DAYS Performed at [facility], [address], [place], [place] [address]    Report Status PENDING   Incomplete  Culture, blood (routine x 2)     Status: None (Preliminary result)   Collection Time: 05/11/24  4:22 PM   Specimen: Left Antecubital; Blood  Result Value Ref Range Status   Specimen Description   Final    LEFT ANTECUBITAL BOTTLES DRAWN AEROBIC AND ANAEROBIC   Special Requests   Final    Blood Culture results may not be optimal due to an inadequate volume of blood received in culture bottles   Culture   Final    NO GROWTH 2 DAYS Performed at [facility], [address] [place], [place] [address]    Report Status PENDING  Incomplete    Radiology Studies: ECHOCARDIOGRAM COMPLETE Result Date: 05/12/2024    ECHOCARDIOGRAM REPORT   Patient Name:   Hector Lowe [facility] Date of Exam: 05/12/2024 Medical Rec #:  [id number]      Height:  70.0 in Accession #:    [id number]     Weight:       102.3 lb Date of Birth:  1921/06/01     BSA:          1.570 m Patient Age:    89 years      BP:           124/87 mmHg Patient Gender: M              HR:           87 bpm. Exam Location:  [facility] Procedure: 2D Echo, Cardiac Doppler and Color Doppler (Both Spectral and Color            Flow Doppler were utilized during procedure). Indications:    Atrial Fibrillation I48.91  History:        Patient has prior history of Echocardiogram examinations. CAD                 and Previous Myocardial Infarction, Arrythmias:Atrial                 Fibrillation; Risk Factors:Hypertension and Dyslipidemia.  Sonographer:    Aida Pizza RCS Referring Phys: [phone] EJIROGHENE E EMOKPAE IMPRESSIONS  1. Left ventricular ejection fraction, by estimation, is 60 to 65%. The left ventricle has normal function. The left ventricle has no regional wall motion abnormalities. There is severe asymmetric left ventricular hypertrophy of the septal segment. Left  ventricular diastolic function could not be evaluated.  2. Right ventricular systolic function is normal. The right ventricular size is normal. There is mildly elevated pulmonary  artery systolic pressure. The estimated right ventricular systolic pressure is 42.5 mmHg.  3. Left atrial size was mild to moderately dilated.  4. Right atrial size was severely dilated.  5. The mitral valve is normal in structure. Mild to moderate mitral valve regurgitation. No evidence of mitral stenosis.  6. The tricuspid valve is abnormal. Tricuspid valve regurgitation is moderate to severe.  7. The aortic valve is tricuspid. There is moderate calcification of the aortic valve. There is moderate thickening of the aortic valve. Aortic valve regurgitation is not visualized. No aortic stenosis is present. Aortic valve mean gradient measures 3.0  mmHg.  8. The inferior vena cava is dilated in size with <50% respiratory variability, suggesting right atrial pressure of 15 mmHg. Comparison(s): No significant change from prior study. FINDINGS  Left Ventricle: Left ventricular ejection fraction, by estimation, is 60 to 65%. The left ventricle has normal function. The left ventricle has no regional wall motion abnormalities. Strain was performed and the global longitudinal strain is indeterminate. The left ventricular internal cavity size was normal in size. There is severe asymmetric left ventricular hypertrophy of the septal segment. Left ventricular diastolic function could not be evaluated due to atrial fibrillation. Left ventricular diastolic function could not be evaluated. Right Ventricle: The right ventricular size is normal. No increase in right ventricular wall thickness. Right ventricular systolic function is normal. There is mildly elevated pulmonary artery systolic pressure. The tricuspid regurgitant velocity is 2.62  m/s, and with an assumed right atrial pressure of 15 mmHg, the estimated right ventricular systolic pressure is 42.5 mmHg. Left Atrium: Left atrial size was mild to moderately dilated. Right Atrium: Right atrial size was severely dilated. Pericardium: There is no evidence of pericardial  effusion. Mitral Valve: The mitral valve is normal in structure. Mild to moderate mitral valve regurgitation. No evidence of mitral valve stenosis. Tricuspid Valve: The tricuspid  valve is abnormal. Tricuspid valve regurgitation is moderate to severe. No evidence of tricuspid stenosis. Aortic Valve: The aortic valve is tricuspid. There is moderate calcification of the aortic valve. There is moderate thickening of the aortic valve. Aortic valve regurgitation is not visualized. No aortic stenosis is present. Aortic valve mean gradient measures 3.0 mmHg. Aortic valve peak gradient measures 6.9 mmHg. Aortic valve area, by VTI measures 1.13 cm. Pulmonic Valve: The pulmonic valve was normal in structure. Pulmonic valve regurgitation is mild to moderate. No evidence of pulmonic stenosis. Aorta: The aortic root is normal in size and structure. Venous: The inferior vena cava is dilated in size with less than 50% respiratory variability, suggesting right atrial pressure of 15 mmHg. IAS/Shunts: No atrial level shunt detected by color flow Doppler. Additional Comments: 3D was performed not requiring image post processing on an independent workstation and was indeterminate.  LEFT VENTRICLE PLAX 2D LVIDd:         3.10 cm LVIDs:         2.20 cm LV PW:         1.00 cm LV IVS:        1.50 cm LVOT diam:     1.70 cm LV SV:         24 LV SV Index:   15 LVOT Area:     2.27 cm  RIGHT VENTRICLE RV S prime:     9.20 cm/s TAPSE (M-mode): 1.1 cm LEFT ATRIUM             Index        RIGHT ATRIUM           Index LA diam:        3.10 cm 1.97 cm/m   RA Area:     31.40 cm LA Vol (A2C):   60.6 ml 38.60 ml/m  RA Volume:   117.00 ml 74.53 ml/m LA Vol (A4C):   61.5 ml 39.18 ml/m LA Biplane Vol: 60.3 ml 38.41 ml/m  AORTIC VALVE AV Area (Vmax):    0.94 cm AV Area (Vmean):   1.11 cm AV Area (VTI):     1.13 cm AV Vmax:           131.00 cm/s AV Vmean:          75.400 cm/s AV VTI:            0.211 m AV Peak Grad:      6.9 mmHg AV Mean Grad:       3.0 mmHg LVOT Vmax:         54.00 cm/s LVOT Vmean:        36.900 cm/s LVOT VTI:          0.105 m LVOT/AV VTI ratio: 0.50  AORTA Ao Root diam: 3.20 cm MITRAL VALVE                  TRICUSPID VALVE MV Area (PHT): 4.80 cm       TR Peak grad:   27.5 mmHg MV Decel Time: 158 msec       TR Vmax:        262.00 cm/s MR Peak grad:    117.9 mmHg MR Mean grad:    74.0 mmHg    SHUNTS MR Vmax:         543.00 cm/s  Systemic VTI:  0.10 m MR Vmean:        399.0 cm/s   Systemic Diam: 1.70 cm MR PISA:  4.02 cm MR PISA Eff ROA: 16 mm MR PISA Radius:  0.80 cm MV E velocity: 110.00 cm/s Vishnu Priya Mallipeddi Electronically signed by Diannah Late Mallipeddi Signature Date/Time: 05/12/2024/3:59:04 PM    Final    Scheduled Meds:  aspirin   81 mg Oral Daily   heparin   5,000 Units Subcutaneous Q8H   metoprolol  tartrate  25 mg Oral BID   mouth rinse  15 mL Mouth Rinse 4 times per day   oseltamivir   30 mg Oral Daily   Continuous Infusions:  azithromycin  500 mg (05/12/24 1748)   cefTRIAXone  (ROCEPHIN )  IV 2 g (05/13/24 1443)     LOS: 2 days    Time spent: 50 minutes    Eric Nunnery, MD [facility]   To contact the attending provider between 7A-7P or the covering provider during after hours 7P-7A, please log into the web site [email or web address] and access using universal Joy password for that web site. If you do not have the password, please call the hospital operator.  05/13/2024, 3:21 PM    "

## 2024-05-13 NOTE — Progress Notes (Signed)
 SATURATION QUALIFICATIONS: (This note is used to comply with regulatory documentation for home oxygen)  Patient Saturations on Room Air at Rest = 97%  Patient Saturations on Room Air while Ambulating = 87%  Patient Saturations on 4 Liters of oxygen while Ambulating = 90%  Please briefly explain why patient needs home oxygen:

## 2024-05-13 NOTE — Plan of Care (Signed)
" °  Problem: Clinical Measurements: Goal: Respiratory complications will improve Outcome: Progressing   Problem: Activity: Goal: Risk for activity intolerance will decrease Outcome: Progressing   Problem: Nutrition: Goal: Adequate nutrition will be maintained Outcome: Progressing   Problem: Safety: Goal: Ability to remain free from injury will improve Outcome: Progressing   Problem: Activity: Goal: Ability to tolerate increased activity will improve Outcome: Progressing   "

## 2024-05-13 NOTE — TOC Progression Note (Addendum)
 Transition of Care [facility]) - Progression Note    Patient Details  Name: HERRON FERO MRN: [id number] Date of Birth: [birth year]  [facility]) CM/SW Contact  [place] Saddie Kim, [place] Phone Number: 05/13/2024, 3:25 PM  Clinical Narrative: Per MD, pt will need 2L home O2. Discussed with pt's son who has no preference on agency. Referred to Zach with [employer]. Zach aware of plan for d/c tomorrow. Portable tank will be delivered to room. Awaiting sat qual note.       Barriers to Discharge: Continued Medical Work up               Expected Discharge Plan and Services                         DME Arranged: Oxygen DME Agency: [employer] Date DME Agency Contacted: 05/13/24 Time DME Agency Contacted: 1525 Representative spoke with at DME Agency: Darlyn             Social Drivers of Health (SDOH) Interventions SDOH Screenings   Food Insecurity: Patient Unable To Answer (05/11/2024)  Housing: Unknown (05/11/2024)  Transportation Needs: Patient Unable To Answer (05/11/2024)  Utilities: Patient Unable To Answer (05/11/2024)  Social Connections: Patient Unable To Answer (05/11/2024)  Tobacco Use: High Risk (05/11/2024)    Readmission Risk Interventions     No data to display

## 2024-05-13 NOTE — Plan of Care (Signed)
   Problem: Education: Goal: Knowledge of General Education information will improve Description Including pain rating scale, medication(s)/side effects and non-pharmacologic comfort measures Outcome: Progressing

## 2024-05-13 NOTE — Progress Notes (Addendum)
 at 2003 was informed by NTpatient's cbg was 64, activated hypoglycemic protocol,  gave him orange juice and rechecked sugar but it became 52, initiated another hypoglycemic protocol and gave him 1/2 amp d50 per protocol, patient states that he can't tolerate to drink anymore, rechecked cbg and now we are at 97 mg/dl. Per patient he only had breakfast and didn't feel like eating lunch and dinner. After drinking the orange juice and while administering d50 patient stated he feels sick and that his right chest, arm and cheek hurts, This nurse did EKG  and shows afib. offered tylenol  but he said he cannot take anything by mouth right now after the orange juice. DR. Adefeso updated about event and actions taken.

## 2024-05-13 NOTE — Evaluation (Signed)
 Clinical/Bedside Swallow Evaluation Patient Details  Name: [name] MRN: [id number] Date of Birth: Apr 25, 1922  Today's Date: 05/13/2024 Time: SLP Start Time (ACUTE ONLY): 1425 SLP Stop Time (ACUTE ONLY): 1445 SLP Time Calculation (min) (ACUTE ONLY): 20 min  Past Medical History:  Past Medical History:  Diagnosis Date   Acid reflux    CAD (coronary artery disease) 06/12/2021   CKD (chronic kidney disease) stage 3, GFR 30-59 ml/min (HCC) 03/08/2020   Constipation 03/08/2020   Essential hypertension 03/08/2020   GERD (gastroesophageal reflux disease) 03/08/2020   Gout flare 03/09/2020   Hyperlipidemia LDL goal <70 06/12/2021   Hypertension    Hyponatremia 03/08/2020   Ileus (HCC) 03/07/2020   Lower abdominal pain 09/01/2020   Mild AKI 06/12/2021   Nicotine dependence, chewing tobacco, w oth disorders 03/08/2020   NSTEMI (non-ST elevated myocardial infarction) (HCC) 06/10/2021   Past Surgical History:  Past Surgical History:  Procedure Laterality Date   EYE SURGERY     HERNIA REPAIR     LEFT HEART CATH AND CORONARY ANGIOGRAPHY N/A 06/10/2021   Procedure: LEFT HEART CATH AND CORONARY ANGIOGRAPHY;  Surgeon: Court Dorn JINNY, MD;  Location: [facility];  Service: Cardiovascular;  Laterality: N/A;   HPI:  Hector Lowe is a 89 y.o. male with medical history significant for CKD 3, hypertension, gout.  Patient was brought to the ED via EMS on 12/21 with reports of 1 week of progressive generalized weakness, sore throat, cough, nausea and vomiting and lower abdominal pain.  Over the past week he has barely had any oral intake, he has had vomiting with every oral intake, and has not been able to stay hydrated.  He has also had cough productive of thick sputum.  No chest pain.  Per family, patient initially declined coming to the ED, today he was unable to get out of bed felt worse and so agreed to come in.  Saw his outpatient provider a few days ago, was diagnosed with a sinus  infection, started on a course of penicillin. CT chest revealed Moderate right and small left pleural effusions with partial  compressive atelectasis of the lower lobes. Pneumonia is not  excluded.  ST consulted for clinical swallow evaluation.    Assessment / Plan / Recommendation  Clinical Impression  Recommend upgrading to Dysphagia 1(Puree)/thin liquids with A for self-feeding/encouragement for increased po intake with general swallow precautions.  ST will f/u in acute setting for diet tolerance/advancement. Mr. Allston was seen for clinical swallow evaluation with edentulous state and odynophagia (sore throat) impacting overall swallow function.  Pt consumed thin via straw with small sips and puree consistencies without overt s/s of aspiration and slightly delayed swallow initiation (expected for age/presbyphagia).  Pt did exhibit min belching post intake which could indicate esophageal symptoms/dysphagia, so this will continue to be assessed during acute stay.  Recommend conservative upgraded diet d/t edentulous state/deconditioning and decreased satiety.  ST will f/u for dysphagia tx/management.  Thank you for this consult. SLP Visit Diagnosis: Dysphagia, unspecified (R13.10)    Aspiration Risk  Mild aspiration risk    Diet Recommendation   Thin;Dysphagia 1 (puree)  Medication Administration: Whole meds with puree (crushed prn if larger pills)    Other Recommendations Oral Care Recommendations: Oral care BID     Swallow Evaluation Recommendations  PO diet (Dysphagia 1/thin)   Assistance Recommended at Discharge  TBD  Functional Status Assessment Patient has had a recent decline in their functional status and demonstrates the ability  to make significant improvements in function in a reasonable and predictable amount of time.  Frequency and Duration min 1 x/week  1 week       Prognosis Prognosis for improved oropharyngeal function: Good      Swallow Study   General Date of Onset:  05/11/24 HPI: Hector Lowe is a 89 y.o. male with medical history significant for CKD 3, hypertension, gout.  Patient was brought to the ED via EMS on 12/21 with reports of 1 week of progressive generalized weakness, sore throat, cough, nausea and vomiting and lower abdominal pain.  Over the past week he has barely had any oral intake, he has had vomiting with every oral intake, and has not been able to stay hydrated.  He has also had cough productive of thick sputum.  No chest pain.  Per family, patient initially declined coming to the ED, today he was unable to get out of bed felt worse and so agreed to come in.  Saw his outpatient provider a few days ago, was diagnosed with a sinus infection, started on a course of penicillin. CT chest revealed Moderate right and small left pleural effusions with partial  compressive atelectasis of the lower lobes. Pneumonia is not  excluded.  ST consulted for clinical swallow evaluation. Type of Study: Bedside Swallow Evaluation Previous Swallow Assessment: n/a Diet Prior to this Study: Thin liquids (Level 0) (full liquids) Temperature Spikes Noted: No Respiratory Status: Nasal cannula (1L) History of Recent Intubation: No Behavior/Cognition: Alert;Cooperative;Requires cueing;Other (Comment) (HOH) Oral Cavity Assessment: Within Functional Limits Oral Care Completed by SLP: No Oral Cavity - Dentition: Edentulous Vision: Functional for self-feeding Self-Feeding Abilities: Needs assist Patient Positioning: Upright in bed Baseline Vocal Quality: Hoarse Volitional Cough: Strong Volitional Swallow: Able to elicit    Oral/Motor/Sensory Function Overall Oral Motor/Sensory Function: Generalized oral weakness   Ice Chips Ice chips: Not tested   Thin Liquid Thin Liquid: Within functional limits Presentation: Self Fed;Straw    Nectar Thick Nectar Thick Liquid: Not tested   Honey Thick Honey Thick Liquid: Not tested   Puree Puree: Within functional  limits Presentation: Spoon   Solid     Solid: Not tested Other Comments: pt declined d/t edentulous state      Pat Janeva Peaster,M.S.,CCC-SLP 05/13/2024,2:56 PM

## 2024-05-13 NOTE — Progress Notes (Signed)
 Mobility Specialist Progress Note:    05/13/24 1145  Mobility  Activity Ambulated with assistance  Level of Assistance Standby assist, set-up cues, supervision of patient - no hands on  Assistive Device Front wheel walker  Distance Ambulated (ft) 20 ft  Range of Motion/Exercises Active;All extremities  Activity Response Tolerated well  Mobility Referral Yes  Mobility visit 1 Mobility  Mobility Specialist Start Time (ACUTE ONLY) 1145  Mobility Specialist Stop Time (ACUTE ONLY) 1213  Mobility Specialist Time Calculation (min) (ACUTE ONLY) 28 min   Pt received supine, RN requesting O2 test. Required SBA to stand and ambulate with RW. Tolerated well, c/o SOB during ambulation. SpO2 97% on RA at rest, SpO2 hard to determine during ambulation and seated EOS due to poor pleth. Dinamap said SpO2 87% on RA during ambulation which does correlate to his symptoms. Returned supine, all needs met.   Hector Lowe Mobility Specialist Please contact via Special Educational Needs Teacher or  Rehab office at [phone]

## 2024-05-14 DIAGNOSIS — E870 Hyperosmolality and hypernatremia: Secondary | ICD-10-CM | POA: Diagnosis not present

## 2024-05-14 LAB — LACTIC ACID, PLASMA: Lactic Acid, Venous: 2.5 mmol/L (ref 0.5–1.9)

## 2024-05-14 LAB — BASIC METABOLIC PANEL WITH GFR
Anion gap: 15 (ref 5–15)
BUN: 55 mg/dL — ABNORMAL HIGH (ref 8–23)
CO2: 18 mmol/L — ABNORMAL LOW (ref 22–32)
Calcium: 8.8 mg/dL — ABNORMAL LOW (ref 8.9–10.3)
Chloride: 115 mmol/L — ABNORMAL HIGH (ref 98–111)
Creatinine, Ser: 1.95 mg/dL — ABNORMAL HIGH (ref 0.61–1.24)
GFR, Estimated: 30 mL/min — ABNORMAL LOW
Glucose, Bld: 92 mg/dL (ref 70–99)
Potassium: 4.9 mmol/L (ref 3.5–5.1)
Sodium: 148 mmol/L — ABNORMAL HIGH (ref 135–145)

## 2024-05-14 LAB — GLUCOSE, CAPILLARY
Glucose-Capillary: 92 mg/dL (ref 70–99)
Glucose-Capillary: 83 mg/dL (ref 70–99)
Glucose-Capillary: 128 mg/dL — ABNORMAL HIGH (ref 70–99)
Glucose-Capillary: 137 mg/dL — ABNORMAL HIGH (ref 70–99)
Glucose-Capillary: 69 mg/dL — ABNORMAL LOW (ref 70–99)
Glucose-Capillary: 111 mg/dL — ABNORMAL HIGH (ref 70–99)

## 2024-05-14 LAB — CBC
HCT: 43.7 % (ref 39.0–52.0)
Hemoglobin: 13.2 g/dL (ref 13.0–17.0)
MCH: 30.6 pg (ref 26.0–34.0)
MCHC: 30.2 g/dL (ref 30.0–36.0)
MCV: 101.2 fL — ABNORMAL HIGH (ref 80.0–100.0)
Platelets: 156 K/uL (ref 150–400)
RBC: 4.32 MIL/uL (ref 4.22–5.81)
RDW: 16.9 % — ABNORMAL HIGH (ref 11.5–15.5)
WBC: 11.3 K/uL — ABNORMAL HIGH (ref 4.0–10.5)
nRBC: 0 % (ref 0.0–0.2)

## 2024-05-14 LAB — MAGNESIUM: Magnesium: 2.4 mg/dL (ref 1.7–2.4)

## 2024-05-14 MED ORDER — DEXTROSE 5 % IV SOLN: INTRAVENOUS | Status: DC

## 2024-05-14 NOTE — Evaluation (Signed)
 Physical Therapy Evaluation Patient Details Name: Hector Lowe MRN: [id number] DOB: Mar 01, 1922 Today's Date: 05/14/2024  History of Present Illness  Hector Lowe is a 89 y.o. male with medical history significant for CKD 3, hypertension, gout.  Patient was brought to the ED via EMS with reports of 1 week of progressive generalized weakness, sore throat, cough, nausea and vomiting and lower abdominal pain.  Over the past week he has barely had any oral intake, he has had vomiting with every oral intake, and has not been able to stay hydrated.  He has also had cough productive of thick sputum.  No chest pain.  Per family, patient initially declined coming to the ED, today he was unable to get out of bed felt worse and so agreed to come in.  Saw his outpatient provider a few days ago, was diagnosed with a sinus infection, started on a course of penicillin.   Clinical Impression  Patient demonstrates  labored movement for sitting up at bedside with [facility] partially raised, very unsteady on feet with slow labored movement during ambulation, flexed trunk, easily fatigued and limited mostly due to weakness. Patient on room air with SpO2 at 88-91%, required 1 LPM O2 to keep SpO2 at 90-91% after sitting in chair. Patient tolerated sitting up in chair after therapy. Patient will benefit from continued skilled physical therapy in hospital and recommended venue below to increase strength, balance, endurance for safe ADLs and gait.           If plan is discharge home, recommend the following: A lot of help with bathing/dressing/bathroom;A lot of help with walking and/or transfers;Help with stairs or ramp for entrance;Assist for transportation;Assistance with cooking/housework   Can travel by private vehicle   Yes    Equipment Recommendations None recommended by PT  Recommendations for Other Services       Functional Status Assessment Patient has had a recent decline in their functional status and  demonstrates the ability to make significant improvements in function in a reasonable and predictable amount of time.     Precautions / Restrictions Precautions Precautions: Fall Recall of Precautions/Restrictions: Intact Restrictions Weight Bearing Restrictions Per Provider Order: No      Mobility  Bed Mobility Overal bed mobility: Needs Assistance Bed Mobility: Supine to Sit     Supine to sit: Supervision     General bed mobility comments: Mild labored movement.    Transfers Overall transfer level: Needs assistance Equipment used: Rolling walker (2 wheels) Transfers: Sit to/from Stand Sit to Stand: Contact guard assist, Min assist           General transfer comment: increased time, labored movement    Ambulation/Gait Ambulation/Gait assistance: Mod assist Gait Distance (Feet): 15 Feet Assistive device: Rolling walker (2 wheels) Gait Pattern/deviations: Decreased step length - right, Decreased step length - left, Trunk flexed, Decreased stride length Gait velocity: slow     General Gait Details: slow labored unsteady movement with flexed trunk, limited mostly due to fatigue, SOB and weakness  Stairs            Wheelchair Mobility     Tilt Bed    Modified Rankin (Stroke Patients Only)       Balance Overall balance assessment: Needs assistance Sitting-balance support: Feet supported, No upper extremity supported Sitting balance-Leahy Scale: Fair Sitting balance - Comments: seated at EOB   Standing balance support: Bilateral upper extremity supported, During functional activity, Reliant on assistive device for balance Standing balance-Leahy Scale: Poor Standing  balance comment: fair/poor using RW                             Pertinent Vitals/Pain Pain Assessment Pain Assessment: No/denies pain    Home Living Family/patient expects to be discharged to:: Private residence Living Arrangements: Alone Available Help at Discharge:  Family;Available PRN/intermittently Type of Home: House Home Access: Stairs to enter Entrance Stairs-Rails: None Entrance Stairs-Number of Steps: 1.5   Home Layout: Two level;Laundry or work area in basement;Able to live on main level with bedroom/bathroom Home Equipment: Agricultural Consultant (2 wheels);Rollator (4 wheels);BSC/3in1;Cane - single point;Cane - quad      Prior Function Prior Level of Function : Independent/Modified Independent             Mobility Comments: SPC used for community ambulation; No AD used in the house until recently when pt started using rollator. ADLs Comments: Independent ADL's; able to cook simple meals.     Extremity/Trunk Assessment   Upper Extremity Assessment Upper Extremity Assessment: Defer to OT evaluation    Lower Extremity Assessment Lower Extremity Assessment: Generalized weakness    Cervical / Trunk Assessment Cervical / Trunk Assessment: Kyphotic  Communication   Communication Communication: Impaired Factors Affecting Communication: Hearing impaired    Cognition Arousal: Alert Behavior During Therapy: WFL for tasks assessed/performed                             Following commands: Intact       Cueing Cueing Techniques: Verbal cues, Tactile cues, Gestural cues     General Comments General comments (skin integrity, edema, etc.): Unable to get a consistent O2 reading. Pt trialed on room air but ultimately left on 1 LPM supplemental O2.    Exercises     Assessment/Plan    PT Assessment Patient needs continued PT services  PT Problem List Decreased strength;Decreased activity tolerance;Decreased balance;Decreased mobility       PT Treatment Interventions DME instruction;Gait training;Stair training;Functional mobility training;Therapeutic activities;Therapeutic exercise;Balance training;Patient/family education    PT Goals (Current goals can be found in the Care Plan section)  Acute Rehab PT Goals Patient  Stated Goal: return home after rehab PT Goal Formulation: With patient/family Time For Goal Achievement: 05/28/24 Potential to Achieve Goals: Good    Frequency Min 3X/week     Co-evaluation PT/OT/SLP Co-Evaluation/Treatment: Yes Reason for Co-Treatment: To address functional/ADL transfers PT goals addressed during session: Mobility/safety with mobility;Balance;Proper use of DME OT goals addressed during session: ADL's and self-care       AM-PAC PT 6 Clicks Mobility  Outcome Measure Help needed turning from your back to your side while in a flat bed without using bedrails?: A Little Help needed moving from lying on your back to sitting on the side of a flat bed without using bedrails?: A Little Help needed moving to and from a bed to a chair (including a wheelchair)?: A Lot Help needed standing up from a chair using your arms (e.g., wheelchair or bedside chair)?: A Lot Help needed to walk in hospital room?: A Lot Help needed climbing 3-5 steps with a railing? : A Lot 6 Click Score: 14    End of Session   Activity Tolerance: Patient tolerated treatment well;Patient limited by fatigue Patient left: in chair;with call bell/phone within reach Nurse Communication: Mobility status PT Visit Diagnosis: Unsteadiness on feet (R26.81);Other abnormalities of gait and mobility (R26.89);Muscle weakness (generalized) (M62.81)  Time: 0913-0950 PT Time Calculation (min) (ACUTE ONLY): 37 min   Charges:   PT Evaluation $PT Eval Moderate Complexity: 1 Mod PT Treatments $Therapeutic Activity: 23-37 mins PT General Charges $$ ACUTE PT VISIT: 1 Visit         2:17 PM, 05/14/2024 Lynwood Music, MPT Physical Therapist with [facility] [address] [phone] office [phone] mobile phone

## 2024-05-14 NOTE — [place] FL2 (Signed)
 " [place]  MEDICAID FL2 LEVEL OF CARE FORM     IDENTIFICATION  Patient Name: Hector Lowe Birthdate: [birth year] Sex: male Admission Date (Current Location): 05/11/2024  [facility] and [place] Number:  [employer] and Address:  [facility],  [address] [place] [address]      Provider Number: [id number]  Attending Physician Name and Address:  Maree Adron BIRCH, DO  Relative Name and Phone Number:  Hector Lowe- Son [phone]    Current Level of Care: Hospital Recommended Level of Care: Skilled Nursing Facility Prior Approval Number:    Date Approved/Denied:   PASRR Number: [id number]  Discharge Plan: SNF    Current Diagnoses: Patient Active Problem List   Diagnosis Date Noted   Hypernatremia 05/11/2024   Influenza A 05/11/2024   Acute hypoxic respiratory failure (HCC) 05/11/2024   Atrial fibrillation with RVR (HCC) 05/11/2024   DNR (do not resuscitate) discussion 06/30/2021   Hyperlipidemia LDL goal <70 06/12/2021   CAD (coronary artery disease) 06/12/2021   Mild AKI 06/12/2021   NSTEMI (non-ST elevated myocardial infarction) (HCC) 06/10/2021   Lower abdominal pain 09/01/2020   Gout flare 03/09/2020   Constipation 03/08/2020   Hyponatremia 03/08/2020   Essential hypertension 03/08/2020   CKD (chronic kidney disease) stage 3, GFR 30-59 ml/min (HCC) 03/08/2020   GERD (gastroesophageal reflux disease) 03/08/2020   Nicotine dependence, chewing tobacco, w oth disorders 03/08/2020   Ileus (HCC) 03/07/2020    Orientation RESPIRATION BLADDER Height & Weight     Self, Time, Situation, Place  O2 (1L) Continent Weight: 102 lb 4.7 oz (46.4 kg) Height:  5' 10 (177.8 cm)  BEHAVIORAL SYMPTOMS/MOOD NEUROLOGICAL BOWEL NUTRITION STATUS      Continent Diet (See DC summary)  AMBULATORY STATUS COMMUNICATION OF NEEDS Skin   Extensive Assist Verbally Normal                       Personal Care Assistance Level of Assistance  Bathing,  Feeding, Dressing Bathing Assistance: Maximum assistance Feeding assistance: Independent Dressing Assistance: Maximum assistance     Functional Limitations Info  Sight, Hearing, Speech Sight Info: Impaired Hearing Info: Impaired (Hearing Aids) Speech Info: Adequate    SPECIAL CARE FACTORS FREQUENCY  PT (By licensed PT), OT (By licensed OT)     PT Frequency: 5 x a week OT Frequency: 5 x a week            Contractures Contractures Info: Not present    Additional Factors Info  Code Status, Allergies Code Status Info: DNR-Limited Allergies Info: NKA           Current Medications (05/14/2024):  This is the current hospital active medication list Current Facility-Administered Medications  Medication Dose Route Frequency Provider Last Rate Last Admin   acetaminophen  (TYLENOL ) tablet 650 mg  650 mg Oral Q6H PRN Emokpae, Ejiroghene E, MD       Or   acetaminophen  (TYLENOL ) suppository 650 mg  650 mg Rectal Q6H PRN Emokpae, Ejiroghene E, MD   650 mg at 05/13/24 2352   aspirin  chewable tablet 81 mg  81 mg Oral Daily Emokpae, Ejiroghene E, MD   81 mg at 05/14/24 0833   azithromycin  (ZITHROMAX ) 500 mg in sodium chloride  0.9 % 250 mL IVPB  500 mg Intravenous Q24H Kammerer, Megan L, DO   Stopped at 05/13/24 1637   cefTRIAXone  (ROCEPHIN ) 2 g in sodium chloride  0.9 % 100 mL IVPB  2 g Intravenous Q24H Emokpae, Ejiroghene E,  MD   Stopped at 05/13/24 1523   dextrose  5 % solution   Intravenous Continuous Shah, Pratik D, DO 100 mL/hr at 05/14/24 9077 New Bag at 05/14/24 9077   feeding supplement (ENSURE PLUS HIGH PROTEIN) liquid 237 mL  237 mL Oral BID BM Ricky Fines, MD   237 mL at 05/14/24 0833   heparin  injection 5,000 Units  5,000 Units Subcutaneous Q8H Emokpae, Ejiroghene E, MD   5,000 Units at 05/14/24 0530   metoprolol  tartrate (LOPRESSOR ) injection 5 mg  5 mg Intravenous Q5 min PRN Emokpae, Ejiroghene E, MD   5 mg at 05/11/24 1607   metoprolol  tartrate (LOPRESSOR ) tablet 25 mg  25  mg Oral BID Emokpae, Ejiroghene E, MD   25 mg at 05/14/24 0833   ondansetron  (ZOFRAN ) tablet 4 mg  4 mg Oral Q6H PRN Emokpae, Ejiroghene E, MD   4 mg at 05/12/24 9044   Or   ondansetron  (ZOFRAN ) injection 4 mg  4 mg Intravenous Q6H PRN Emokpae, Ejiroghene E, MD   4 mg at 05/13/24 2054   Oral care mouth rinse  15 mL Mouth Rinse 4 times per day Ricky Fines, MD   15 mL at 05/14/24 1243   Oral care mouth rinse  15 mL Mouth Rinse PRN Ricky Fines, MD       oseltamivir  (TAMIFLU ) capsule 30 mg  30 mg Oral Daily Emokpae, Ejiroghene E, MD   30 mg at 05/14/24 0918   polyethylene glycol (MIRALAX  / GLYCOLAX ) packet 17 g  17 g Oral Daily PRN Emokpae, Ejiroghene E, MD         Discharge Medications: Please see discharge summary for a list of discharge medications.  Relevant Imaging Results:  Relevant Lab Results:   Additional Information SS# [id number]  [name], LCSWA     "

## 2024-05-14 NOTE — Plan of Care (Signed)
" °  Problem: Clinical Measurements: Goal: Will remain free from infection Outcome: Not Progressing   Problem: Clinical Measurements: Goal: Respiratory complications will improve Outcome: Not Progressing   Problem: Nutrition: Goal: Adequate nutrition will be maintained Outcome: Not Progressing   Problem: Safety: Goal: Ability to remain free from injury will improve Outcome: Not Progressing   "

## 2024-05-14 NOTE — Progress Notes (Signed)
 " [name]  FMW:[id number] DOB: 05/03/1922 DOA: 05/11/2024 PCP: Hector Lowe   Brief Narrative:    Hector Lowe is a 89 y.o. male with medical history significant for CKD 3, hypertension, gout. Patient was brought to the ED via EMS with reports of 1 week of progressive generalized weakness, sore throat, cough, nausea and vomiting and lower abdominal pain.  Over the past week he has barely had any oral intake, he has had vomiting with every oral intake, and has not been able to stay hydrated.  Patient was admitted with acute hypoxemic respiratory failure secondary to influenza A infection with likely superimposed pneumonia.  He also has new onset atrial fibrillation with RVR.  He currently continues to have significant amounts of weakness and poor oral intake with hypernatremia.  PT/OT evaluation pending and likely will require SNF on discharge.  Assessment & Plan:   Principal Problem:   Hypernatremia Active Problems:   Influenza A   Acute hypoxic respiratory failure (HCC)   Atrial fibrillation with RVR (HCC)  Assessment and Plan:   1-influenza A infection with presumed superimposed pneumonia - Continue treatment with Tamiflu  - Continue IV antibiotics  - Continue mucolytics. - Continue oxygen supplementation and wean off as tolerated. - Continue supportive care - Continue to follow clinical response. - Maintain adequate hydration and nutrition. - Hopefully ready for discharge home on 05/14/2024. -Monitor lactic acidosis which is downward trending   2-acute hypoxic respiratory failure - Secondary to problem #1 - Desaturation screening demonstrating the need of 2 L nasal cannula supplementation - Will arrange oxygen at discharge.   3-new onset atrial fibrillation with RVR at time of admission - Excellent response to initial treatment using IV Lopressor  - Continue oral metoprolol  - 2D echo demonstrating preserved ejection fraction and no  significant valvular disorder. - Continue aspirin  for secondary prevention - Follow electrolytes and maintain magnesium above 2 and potassium above 4. - Telemetry evaluation demonstrating atrial fibrillation.   4-chronic kidney disease stage IIIb - Appears to be close to his baseline - Continue to maintain adequate hydration - Continue minimize nephrotoxic agents - Will continue to follow renal function trend especially with initiation of IV fluid   5-hypertension - Stable for the most part - Patient has not been taking medication for about a week prior to admission - Continue the use of metoprolol  for now - Continue holding Norvasc  and Imdur  for now; in order to prevent hypotension. - Follow vital signs.   6-severe protein calorie malnutrition -Body mass index is 14.68 kg/m.  -For now using liquid diet - Speech therapy has been contacted - Continue supportive care. - Feeding supplements twice a day between meals have been started.  7-hypernatremia - Started on D5W IV fluid  8-weakness/deconditioning -Appreciate PT/OT evaluation and likely will require SNF/rehab    DVT prophylaxis:Heparin  Code Status: DNR Family Communication: Son at bedside 12/24 Disposition Plan:  Status is: Inpatient Remains inpatient appropriate because: Need for IV medications and fluids.   Consultants:  None  Procedures:  None  Antimicrobials:  Anti-infectives (From admission, onward)    Start     Dose/Rate Route Frequency Ordered Stop   05/12/24 1500  cefTRIAXone  (ROCEPHIN ) 2 g in sodium chloride  0.9 % 100 mL IVPB        2 g 200 mL/hr over 30 Minutes Intravenous Every 24 hours 05/11/24 1700 05/17/24 1459   05/11/24 1615  oseltamivir  (TAMIFLU ) capsule 75 mg  Status:  Discontinued  Note to Pharmacy: Pls adjust dose as indicated, thanks.   75 mg Oral Daily 05/11/24 1600 05/11/24 1611   05/11/24 1615  oseltamivir  (TAMIFLU ) capsule 30 mg       Note to Pharmacy: Pls adjust dose as  indicated, thanks.   30 mg Oral Daily 05/11/24 1611 05/16/24 0959   05/11/24 1530  cefTRIAXone  (ROCEPHIN ) 1 g in sodium chloride  0.9 % 100 mL IVPB        1 g 200 mL/hr over 30 Minutes Intravenous  Once 05/11/24 1519 05/11/24 1604   05/11/24 1530  azithromycin  (ZITHROMAX ) 500 mg in sodium chloride  0.9 % 250 mL IVPB        500 mg 250 mL/hr over 60 Minutes Intravenous Every 24 hours 05/11/24 1519         Subjective: Patient seen and evaluated today with no new acute complaints or concerns. No acute concerns or events noted overnight.  Patient noted to be quite weak and still has poor oral intake.  Son at bedside concerned about discharge and states that he may require rehabilitation.  Objective: Vitals:   05/12/24 2015 05/13/24 0337 05/13/24 2002 05/14/24 0422  BP: 115/70 109/74 122/85 116/78  Pulse: 82 83 96 87  Resp: 19 18 18 16   Temp: (!) 97.3 F (36.3 C) 97.7 F (36.5 C) (!) 97.5 F (36.4 C) (!) 97.3 F (36.3 C)  TempSrc: Oral Oral Oral Oral  SpO2: 98% 96% 96% 95%  Weight:      Height:        Intake/Output Summary (Last 24 hours) at 05/14/2024 1048 Last data filed at 05/13/2024 2256 Gross per 24 hour  Intake 450 ml  Output --  Net 450 ml   Filed Weights   05/11/24 1710  Weight: 46.4 kg    Examination:  General exam: Appears calm and comfortable  Respiratory system: Clear to auscultation. Respiratory effort normal. Cardiovascular system: S1 & S2 heard, RRR.  Gastrointestinal system: Abdomen is soft Central nervous system: Alert and awake Extremities: No edema Skin: No significant lesions noted Psychiatry: Flat affect.    Data Reviewed: I have personally reviewed following labs and imaging studies  CBC: Recent Labs  Lab 05/11/24 1236 05/13/24 0451 05/14/24 0744  WBC 9.0 8.1 11.3*  NEUTROABS 6.9  --   --   HGB 15.6 12.3* 13.2  HCT 52.3* 40.4 43.7  MCV 101.2* 100.0 101.2*  PLT 161 128* 156   Basic Metabolic Panel: Recent Labs  Lab 05/11/24 1349  05/11/24 1434 05/13/24 0451 05/14/24 0744  NA 155*  --  149* 148*  K 4.6  --  4.5 4.9  CL 117*  --  117* 115*  CO2 18*  --  16* 18*  GLUCOSE 79  --  65* 92  BUN 37*  --  47* 55*  CREATININE 1.88*  --  1.87* 1.95*  CALCIUM  9.4  --  8.3* 8.8*  MG  --  2.3  --  2.4   GFR: Estimated Creatinine Clearance: 13.2 mL/min (A) (by C-G formula based on SCr of 1.95 mg/dL (H)). Liver Function Tests: Recent Labs  Lab 05/11/24 1349  AST 36  ALT 18  ALKPHOS 162*  BILITOT 1.2  PROT 7.5  ALBUMIN 3.6   Recent Labs  Lab 05/11/24 1349  LIPASE 37   No results for input(s): AMMONIA in the last 168 hours. Coagulation Profile: No results for input(s): INR, PROTIME in the last 168 hours. Cardiac Enzymes: No results for input(s): CKTOTAL, CKMB, CKMBINDEX, TROPONINI in  the last 168 hours. BNP (last 3 results) No results for input(s): PROBNP in the last 8760 hours. HbA1C: No results for input(s): HGBA1C in the last 72 hours. CBG: Recent Labs  Lab 05/13/24 2111 05/13/24 2122 05/13/24 2355 05/14/24 0422 05/14/24 0730  GLUCAP 69* 97 119* 92 83   Lipid Profile: No results for input(s): CHOL, HDL, LDLCALC, TRIG, CHOLHDL, LDLDIRECT in the last 72 hours. Thyroid  Function Tests: Recent Labs    05/11/24 1434  TSH 2.960   Anemia Panel: No results for input(s): VITAMINB12, FOLATE, FERRITIN, TIBC, IRON, RETICCTPCT in the last 72 hours. Sepsis Labs: Recent Labs  Lab 05/11/24 1236 05/11/24 1349 05/11/24 1434 05/11/24 1622 05/14/24 0744  PROCALCITON  --   --  0.25  --   --   LATICACIDVEN 3.1* 3.3*  --  3.1* 2.5*    Recent Results (from the past 240 hours)  Resp panel by RT-PCR (RSV, Flu A&B, Covid) Anterior Nasal Swab     Status: Abnormal   Collection Time: 05/11/24 12:36 PM   Specimen: Anterior Nasal Swab  Result Value Ref Range Status   SARS Coronavirus 2 by RT PCR NEGATIVE NEGATIVE Final    Comment: (NOTE) SARS-CoV-2 target nucleic acids  are NOT DETECTED.  The SARS-CoV-2 RNA is generally detectable in upper respiratory specimens during the acute phase of infection. The lowest concentration of SARS-CoV-2 viral copies this assay can detect is 138 copies/mL. A negative result does not preclude SARS-Cov-2 infection and should not be used as the sole basis for treatment or other patient management decisions. A negative result may occur with  improper specimen collection/handling, submission of specimen other than nasopharyngeal swab, presence of viral mutation(s) within the areas targeted by this assay, and inadequate number of viral copies(<138 copies/mL). A negative result must be combined with clinical observations, patient history, and epidemiological information. The expected result is Negative.  Fact Sheet for Patients:  bloggercourse.com  Fact Sheet for Healthcare Providers:  seriousbroker.it  This test is no t yet approved or cleared by the [place]  FDA and  has been authorized for detection and/or diagnosis of SARS-CoV-2 by FDA under an Emergency Use Authorization (EUA). This EUA will remain  in effect (meaning this test can be used) for the duration of the COVID-19 declaration under Section 564(b)(1) of the Act, 21 U.S.C.section 360bbb-3(b)(1), unless the authorization is terminated  or revoked sooner.       Influenza A by PCR POSITIVE (A) NEGATIVE Final   Influenza B by PCR NEGATIVE NEGATIVE Final    Comment: (NOTE) The Xpert Xpress SARS-CoV-2/FLU/RSV plus assay is intended as an aid in the diagnosis of influenza from Nasopharyngeal swab specimens and should not be used as a sole basis for treatment. Nasal washings and aspirates are unacceptable for Xpert Xpress SARS-CoV-2/FLU/RSV testing.  Fact Sheet for Patients: bloggercourse.com  Fact Sheet for Healthcare Providers: seriousbroker.it  This test  is not yet approved or cleared by the [place]  FDA and has been authorized for detection and/or diagnosis of SARS-CoV-2 by FDA under an Emergency Use Authorization (EUA). This EUA will remain in effect (meaning this test can be used) for the duration of the COVID-19 declaration under Section 564(b)(1) of the Act, 21 U.S.C. section 360bbb-3(b)(1), unless the authorization is terminated or revoked.     Resp Syncytial Virus by PCR NEGATIVE NEGATIVE Final    Comment: (NOTE) Fact Sheet for Patients: bloggercourse.com  Fact Sheet for Healthcare Providers: seriousbroker.it  This test is not yet approved or cleared by  the [place]  FDA and has been authorized for detection and/or diagnosis of SARS-CoV-2 by FDA under an Emergency Use Authorization (EUA). This EUA will remain in effect (meaning this test can be used) for the duration of the COVID-19 declaration under Section 564(b)(1) of the Act, 21 U.S.C. section 360bbb-3(b)(1), unless the authorization is terminated or revoked.  Performed at [facility], [address] [place], [place] [address]   Culture, blood (routine x 2)     Status: None (Preliminary result)   Collection Time: 05/11/24  4:22 PM   Specimen: BLOOD LEFT FOREARM  Result Value Ref Range Status   Specimen Description BLOOD LEFT FOREARM AEROBIC BOTTLE ONLY  Final   Special Requests   Final    Blood Culture results may not be optimal due to an inadequate volume of blood received in culture bottles   Culture   Final    NO GROWTH 2 DAYS Performed at [facility], [address], [place], [place] [address]    Report Status PENDING  Incomplete  Culture, blood (routine x 2)     Status: None (Preliminary result)   Collection Time: 05/11/24  4:22 PM   Specimen: Left Antecubital; Blood  Result Value Ref Range Status   Specimen Description   Final    LEFT ANTECUBITAL BOTTLES DRAWN AEROBIC AND ANAEROBIC   Special  Requests   Final    Blood Culture results may not be optimal due to an inadequate volume of blood received in culture bottles   Culture   Final    NO GROWTH 2 DAYS Performed at [facility], [address] [place], [place] [address]    Report Status PENDING  Incomplete         Radiology Studies: ECHOCARDIOGRAM COMPLETE Result Date: 05/12/2024    ECHOCARDIOGRAM REPORT   Patient Name:   Hector Lowe [facility] Date of Exam: 05/12/2024 Medical Rec #:  [id number]      Height:       70.0 in Accession #:    [id number]     Weight:       102.3 lb Date of Birth:  [birth year]     BSA:          1.570 m Patient Age:    89 years      BP:           124/87 mmHg Patient Gender: M              HR:           87 bpm. Exam Location:  [facility] Procedure: 2D Echo, Cardiac Doppler and Color Doppler (Both Spectral and Color            Flow Doppler were utilized during procedure). Indications:    Atrial Fibrillation I48.91  History:        Patient has prior history of Echocardiogram examinations. CAD                 and Previous Myocardial Infarction, Arrythmias:Atrial                 Fibrillation; Risk Factors:Hypertension and Dyslipidemia.  Sonographer:    Aida Pizza RCS Referring Phys: [phone] EJIROGHENE E EMOKPAE IMPRESSIONS  1. Left ventricular ejection fraction, by estimation, is 60 to 65%. The left ventricle has normal function. The left ventricle has no regional wall motion abnormalities. There is severe asymmetric left ventricular hypertrophy of the septal segment. Left  ventricular diastolic function could not be evaluated.  2. Right  ventricular systolic function is normal. The right ventricular size is normal. There is mildly elevated pulmonary artery systolic pressure. The estimated right ventricular systolic pressure is 42.5 mmHg.  3. Left atrial size was mild to moderately dilated.  4. Right atrial size was severely dilated.  5. The mitral valve is normal in structure. Mild to moderate mitral valve regurgitation. No  evidence of mitral stenosis.  6. The tricuspid valve is abnormal. Tricuspid valve regurgitation is moderate to severe.  7. The aortic valve is tricuspid. There is moderate calcification of the aortic valve. There is moderate thickening of the aortic valve. Aortic valve regurgitation is not visualized. No aortic stenosis is present. Aortic valve mean gradient measures 3.0  mmHg.  8. The inferior vena cava is dilated in size with <50% respiratory variability, suggesting right atrial pressure of 15 mmHg. Comparison(s): No significant change from prior study. FINDINGS  Left Ventricle: Left ventricular ejection fraction, by estimation, is 60 to 65%. The left ventricle has normal function. The left ventricle has no regional wall motion abnormalities. Strain was performed and the global longitudinal strain is indeterminate. The left ventricular internal cavity size was normal in size. There is severe asymmetric left ventricular hypertrophy of the septal segment. Left ventricular diastolic function could not be evaluated due to atrial fibrillation. Left ventricular diastolic function could not be evaluated. Right Ventricle: The right ventricular size is normal. No increase in right ventricular wall thickness. Right ventricular systolic function is normal. There is mildly elevated pulmonary artery systolic pressure. The tricuspid regurgitant velocity is 2.62  m/s, and with an assumed right atrial pressure of 15 mmHg, the estimated right ventricular systolic pressure is 42.5 mmHg. Left Atrium: Left atrial size was mild to moderately dilated. Right Atrium: Right atrial size was severely dilated. Pericardium: There is no evidence of pericardial effusion. Mitral Valve: The mitral valve is normal in structure. Mild to moderate mitral valve regurgitation. No evidence of mitral valve stenosis. Tricuspid Valve: The tricuspid valve is abnormal. Tricuspid valve regurgitation is moderate to severe. No evidence of tricuspid stenosis.  Aortic Valve: The aortic valve is tricuspid. There is moderate calcification of the aortic valve. There is moderate thickening of the aortic valve. Aortic valve regurgitation is not visualized. No aortic stenosis is present. Aortic valve mean gradient measures 3.0 mmHg. Aortic valve peak gradient measures 6.9 mmHg. Aortic valve area, by VTI measures 1.13 cm. Pulmonic Valve: The pulmonic valve was normal in structure. Pulmonic valve regurgitation is mild to moderate. No evidence of pulmonic stenosis. Aorta: The aortic root is normal in size and structure. Venous: The inferior vena cava is dilated in size with less than 50% respiratory variability, suggesting right atrial pressure of 15 mmHg. IAS/Shunts: No atrial level shunt detected by color flow Doppler. Additional Comments: 3D was performed not requiring image post processing on an independent workstation and was indeterminate.  LEFT VENTRICLE PLAX 2D LVIDd:         3.10 cm LVIDs:         2.20 cm LV PW:         1.00 cm LV IVS:        1.50 cm LVOT diam:     1.70 cm LV SV:         24 LV SV Index:   15 LVOT Area:     2.27 cm  RIGHT VENTRICLE RV S prime:     9.20 cm/s TAPSE (M-mode): 1.1 cm LEFT ATRIUM  Index        RIGHT ATRIUM           Index LA diam:        3.10 cm 1.97 cm/m   RA Area:     31.40 cm LA Vol (A2C):   60.6 ml 38.60 ml/m  RA Volume:   117.00 ml 74.53 ml/m LA Vol (A4C):   61.5 ml 39.18 ml/m LA Biplane Vol: 60.3 ml 38.41 ml/m  AORTIC VALVE AV Area (Vmax):    0.94 cm AV Area (Vmean):   1.11 cm AV Area (VTI):     1.13 cm AV Vmax:           131.00 cm/s AV Vmean:          75.400 cm/s AV VTI:            0.211 m AV Peak Grad:      6.9 mmHg AV Mean Grad:      3.0 mmHg LVOT Vmax:         54.00 cm/s LVOT Vmean:        36.900 cm/s LVOT VTI:          0.105 m LVOT/AV VTI ratio: 0.50  AORTA Ao Root diam: 3.20 cm MITRAL VALVE                  TRICUSPID VALVE MV Area (PHT): 4.80 cm       TR Peak grad:   27.5 mmHg MV Decel Time: 158 msec       TR  Vmax:        262.00 cm/s MR Peak grad:    117.9 mmHg MR Mean grad:    74.0 mmHg    SHUNTS MR Vmax:         543.00 cm/s  Systemic VTI:  0.10 m MR Vmean:        399.0 cm/s   Systemic Diam: 1.70 cm MR PISA:         4.02 cm MR PISA Eff ROA: 16 mm MR PISA Radius:  0.80 cm MV E velocity: 110.00 cm/s Vishnu Priya Mallipeddi Electronically signed by Diannah Late Mallipeddi Signature Date/Time: 05/12/2024/3:59:04 PM    Final         Scheduled Meds:  aspirin   81 mg Oral Daily   feeding supplement  237 mL Oral BID BM   heparin   5,000 Units Subcutaneous Q8H   metoprolol  tartrate  25 mg Oral BID   mouth rinse  15 mL Mouth Rinse 4 times per day   oseltamivir   30 mg Oral Daily   Continuous Infusions:  azithromycin  Stopped (05/13/24 1637)   cefTRIAXone  (ROCEPHIN )  IV Stopped (05/13/24 1523)   dextrose  100 mL/hr at 05/14/24 0922     LOS: 3 days    Time spent: 55 minutes    Ingris Pasquarella D Maree, DO [facility]  If 7PM-7AM, please contact night-coverage [email or web address] 05/14/2024, 10:48 AM   "

## 2024-05-14 NOTE — Evaluation (Signed)
 Occupational Therapy Evaluation Patient Details Name: Hector Lowe MRN: [id number] DOB: November 12, 1921 Today's Date: 05/14/2024   History of Present Illness   Hector Lowe is a 89 y.o. male with medical history significant for CKD 3, hypertension, gout.  Patient was brought to the ED via EMS with reports of 1 week of progressive generalized weakness, sore throat, cough, nausea and vomiting and lower abdominal pain.  Over the past week he has barely had any oral intake, he has had vomiting with every oral intake, and has not been able to stay hydrated.  He has also had cough productive of thick sputum.  No chest pain.  Per family, patient initially declined coming to the ED, today he was unable to get out of bed felt worse and so agreed to come in.  Saw his outpatient provider a few days ago, was diagnosed with a sinus infection, started on a course of penicillin. (per MD)     Clinical Impressions Pt agreeable to OT and PT co-evaluation. Pt lives alone at baseline and is mostly independent. Today pt was unsteady in standing needing min A for ambulatory transfers with RW. B UE generally weak with pt reporting inability to manage socks, which the pt's son reported the pt can usually so on his own. Pt also assisted for peri-care while standing at the toilet. Room air attempted, but a good O2 saturation reading could not be achieved, so the pt was placed back on 1 LPM supplemental O2. Pt left in the chair with call bell within reach and family present. Pt will benefit from continued OT in the hospital to increase strength, balance, and endurance for safe ADL's.        If plan is discharge home, recommend the following:   A little help with walking and/or transfers;A lot of help with bathing/dressing/bathroom     Functional Status Assessment   Patient has had a recent decline in their functional status and demonstrates the ability to make significant improvements in function in a reasonable  and predictable amount of time.     Equipment Recommendations   None recommended by OT             Precautions/Restrictions   Precautions Precautions: Fall Recall of Precautions/Restrictions: Intact Restrictions Weight Bearing Restrictions Per Provider Order: No     Mobility Bed Mobility Overal bed mobility: Needs Assistance Bed Mobility: Supine to Sit     Supine to sit: Supervision     General bed mobility comments: Mild labored movement.    Transfers Overall transfer level: Needs assistance Equipment used: Rolling walker (2 wheels) Transfers: Sit to/from Stand Sit to Stand: Contact guard assist, Min assist           General transfer comment: Sit to stand from EOB and toilet. Pt functional ambulating in the room after sit to stands.      Balance Overall balance assessment: Needs assistance Sitting-balance support: No upper extremity supported, Feet supported Sitting balance-Leahy Scale: Fair Sitting balance - Comments: seated at EOB   Standing balance support: Bilateral upper extremity supported, During functional activity, Reliant on assistive device for balance Standing balance-Leahy Scale: Poor Standing balance comment: poor to fair with RW                           ADL either performed or assessed with clinical judgement   ADL Overall ADL's : Needs assistance/impaired Eating/Feeding: Set up;Sitting   Grooming: Set up;Sitting  Upper Body Bathing: Set up;Sitting   Lower Body Bathing: Moderate assistance;Maximal assistance;Sitting/lateral leans   Upper Body Dressing : Set up;Sitting   Lower Body Dressing: Maximal assistance;Sitting/lateral leans Lower Body Dressing Details (indicate cue type and reason): Pt reported inability to manage his socks today while seated in the chair. Toilet Transfer: Minimal assistance;Ambulation;Rolling walker (2 wheels) Toilet Transfer Details (indicate cue type and reason): Ambulation to the  toilet from the EOB with RW. Toileting- Clothing Manipulation and Hygiene: Moderate assistance;Sit to/from stand Toileting - Clothing Manipulation Details (indicate cue type and reason): Pt able to complete peri-care partially while standing using the RW; assist given to ensure pt was clean. Pt likely needing more assist if having to manage undergarments and pants.     Functional mobility during ADLs: Minimal assistance;Rolling walker (2 wheels) General ADL Comments: Ambulation in the room with RW.     Vision Baseline Vision/History: 1 Wears glasses;6 Macular Degeneration Ability to See in Adequate Light: 3 Highly impaired Additional Comments: Vision highly impaired at baseline. Son reported the pt's vision as very poor.     Perception Perception: Not tested       Praxis Praxis: Not tested       Pertinent Vitals/Pain Pain Assessment Pain Assessment: No/denies pain     Extremity/Trunk Assessment Upper Extremity Assessment Upper Extremity Assessment: Generalized weakness   Lower Extremity Assessment Lower Extremity Assessment: Defer to PT evaluation   Cervical / Trunk Assessment Cervical / Trunk Assessment: Kyphotic   Communication Communication Communication: Impaired Factors Affecting Communication: Hearing impaired   Cognition Arousal: Alert Behavior During Therapy: WFL for tasks assessed/performed Cognition: No apparent impairments                               Following commands: Intact       Cueing  General Comments   Cueing Techniques: Verbal cues;Tactile cues;Gestural cues  Unable to get a consistent O2 reading. Pt trialed on room air but ultimately left on 1 LPM supplemental O2.              Home Living Family/patient expects to be discharged to:: Private residence Living Arrangements: Alone Available Help at Discharge: Family;Available PRN/intermittently Type of Home: House Home Access: Stairs to enter Entrance Stairs-Number of  Steps: 1.5 Entrance Stairs-Rails: None Home Layout: Two level;Laundry or work area in basement;Able to live on main level with bedroom/bathroom     Bathroom Shower/Tub: Producer, Television/film/video: Standard Bathroom Accessibility: Yes How Accessible: Accessible via wheelchair;Accessible via walker Home Equipment: Agricultural Consultant (2 wheels);Rollator (4 wheels);BSC/3in1;Cane - single point;Cane - quad          Prior Functioning/Environment Prior Level of Function : Independent/Modified Independent             Mobility Comments: SPC used for community ambulation; No AD used in the house until recently when pt started using rollator. ADLs Comments: Independent ADL's; able to cook simple meals.    OT Problem List: Decreased strength;Decreased activity tolerance;Impaired balance (sitting and/or standing);Impaired vision/perception   OT Treatment/Interventions: Self-care/ADL training;Therapeutic exercise;Patient/family education;Balance training;Therapeutic activities;DME and/or AE instruction;Energy conservation      OT Goals(Current goals can be found in the care plan section)   Acute Rehab OT Goals Patient Stated Goal: Improve function. OT Goal Formulation: With patient/family Time For Goal Achievement: 05/28/24 Potential to Achieve Goals: Good   OT Frequency:  Min 2X/week    Co-evaluation PT/OT/SLP Co-Evaluation/Treatment: Yes Reason for  Co-Treatment: To address functional/ADL transfers   OT goals addressed during session: ADL's and self-care      AM-PAC OT 6 Clicks Daily Activity     Outcome Measure                 End of Session Equipment Utilized During Treatment: Rolling walker (2 wheels);Oxygen  Activity Tolerance: Patient tolerated treatment well Patient left: in chair;with call bell/phone within reach;with family/visitor present  OT Visit Diagnosis: Unsteadiness on feet (R26.81);Other abnormalities of gait and mobility (R26.89);Muscle  weakness (generalized) (M62.81)                Time: 9086-9046 OT Time Calculation (min): 40 min Charges:  OT General Charges $OT Visit: 1 Visit OT Evaluation $OT Eval Low Complexity: 1 Low  Markez Dowland OT, MOT  Jayson Person 05/14/2024, 11:18 AM

## 2024-05-14 NOTE — Progress Notes (Signed)
 Pt has refused PO metoprolol  (made me sick earlier) and SQ heparin . Pt refusing most PO intake at this time, attending aware. Palliative consult placed today. BP-124/92 HR-67, afib on monitor. Pt has prn Metoprolol  IV if needed. Dr. Manfred notified.

## 2024-05-14 NOTE — TOC Initial Note (Signed)
 Transition of Care [facility]) - Initial/Assessment Note    Patient Details  Name: Hector Lowe MRN: [id number] Date of Birth: Dec 14, 1921  Transition of Care [facility]) CM/SW Contact:    Noreen KATHEE Cleotilde ISRAEL Phone Number: 05/14/2024, 12:53 PM  Clinical Narrative:                 CSW spoke with patient son about PT recommendation for SNF. Son agreeable and stated that he was present in the room when  patient was working with PT. He stated that patient was too agreeable to going to short-term rehab . Referral sent out via [facility] to [facility], [facility], and [facility]. CSW will continue to follow.   Expected Discharge Plan: Skilled Nursing Facility Barriers to Discharge: Continued Medical Work up   Patient Goals and CMS Choice Patient states their goals for this hospitalization and ongoing recovery are:: get stronger CMS Medicare.gov Compare Post Acute Care list provided to:: Patient Represenative (must comment) (Son- Engineering Geologist) Choice offered to / list presented to : Adult Children      Expected Discharge Plan and Services In-house Referral: Clinical Social Work Discharge Planning Services: CM Consult Post Acute Care Choice: Durable Medical Equipment Living arrangements for the past 2 months: Single Family Home                 DME Arranged: Oxygen DME Agency: [employer] Date DME Agency Contacted: 05/13/24 Time DME Agency Contacted: 1525 Representative spoke with at DME Agency: Darlyn            Prior Living Arrangements/Services Living arrangements for the past 2 months: Single Family Home Lives with:: Self Patient language and need for interpreter reviewed:: Yes Do you feel safe going back to the place where you live?: Yes      Need for Family Participation in Patient Care: Yes (Comment) Care giver support system in place?: No (comment)   Criminal Activity/Legal Involvement Pertinent to Current Situation/Hospitalization: No - Comment as needed  Activities of Daily Living   ADL  Screening (condition at time of admission) Independently performs ADLs?: Yes (appropriate for developmental age) Is the patient deaf or have difficulty hearing?: Yes Does the patient have difficulty seeing, even when wearing glasses/contacts?: Yes Does the patient have difficulty concentrating, remembering, or making decisions?: Yes  Permission Sought/Granted      Share Information with NAME: Engineering Geologist     Permission granted to share info w Relationship: Son     Emotional Assessment       Orientation: : Oriented to Self Alcohol  / Substance Use: Tobacco Use Psych Involvement: No (comment)  Admission diagnosis:  Dehydration [E86.0] Hypernatremia [E87.0] Influenza A [J10.1] Atrial fibrillation with rapid ventricular response (HCC) [I48.91] AKI (acute kidney injury) [N17.9] Pneumonia due to infectious organism, unspecified laterality, unspecified part of lung [J18.9] Vomiting without nausea, unspecified vomiting type [R11.11] Acute hypoxic respiratory failure (HCC) [J96.01] Patient Active Problem List   Diagnosis Date Noted   Hypernatremia 05/11/2024   Influenza A 05/11/2024   Acute hypoxic respiratory failure (HCC) 05/11/2024   Atrial fibrillation with RVR (HCC) 05/11/2024   DNR (do not resuscitate) discussion 06/30/2021   Hyperlipidemia LDL goal <70 06/12/2021   CAD (coronary artery disease) 06/12/2021   Mild AKI 06/12/2021   NSTEMI (non-ST elevated myocardial infarction) (HCC) 06/10/2021   Lower abdominal pain 09/01/2020   Gout flare 03/09/2020   Constipation 03/08/2020   Hyponatremia 03/08/2020   Essential hypertension 03/08/2020   CKD (chronic kidney disease) stage 3, GFR 30-59 ml/min (HCC) 03/08/2020  GERD (gastroesophageal reflux disease) 03/08/2020   Nicotine dependence, chewing tobacco, w oth disorders 03/08/2020   Ileus (HCC) 03/07/2020   PCP:  [name], MD Pharmacy:   [facility] [address], [place] - [address] [place]  [address] Phone: [phone] Fax: [phone]     Social Drivers of Health (SDOH) Social History: SDOH Screenings   Food Insecurity: Patient Unable To Answer (05/11/2024)  Housing: Unknown (05/11/2024)  Transportation Needs: Patient Unable To Answer (05/11/2024)  Utilities: Patient Unable To Answer (05/11/2024)  Social Connections: Patient Unable To Answer (05/11/2024)  Tobacco Use: High Risk (05/11/2024)   SDOH Interventions:     Readmission Risk Interventions    05/14/2024   12:50 PM  Readmission Risk Prevention Plan  Transportation Screening Complete  Home Care Screening Complete  Medication Review (RN CM) Complete

## 2024-05-14 NOTE — Progress Notes (Signed)
 Speech Language Pathology Treatment: Dysphagia  Patient Details Name: Hector Lowe MRN: [id number] DOB: [birth year] Today's Date: 05/14/2024 Time: 8493-8474 SLP Time Calculation (min) (ACUTE ONLY): 19 min  Assessment / Plan / Recommendation Clinical Impression  Continue current diet of Dysphagia 1(Puree)/thin liquids per pt request and unavailable dentition.  ST will f/u during acute stay for progression of diet, dysphagia tx and potential objective study prn.   Hector Lowe was seen for a dysphagia tx with delayed cough after large volume of thin liquids only, but this improved as session progressed with pt given min verbal cues for smaller sips and discussed respiratory precautions during po intake as pt with increased oxygen requirement d/t PNA dx/deconditioning.  Pt consumed thin via sips via cup/straw and puree with declination of solids d/t dentures being unavailable during this session.  ST will f/u for dysphagia management and potential for objective study.     HPI HPI: Hector Lowe is a 89 y.o. male with medical history significant for CKD 3, hypertension, gout. Patient was brought to the ED via EMS on 12/21 with reports of 1 week of progressive generalized weakness, sore throat, cough, nausea and vomiting and lower abdominal pain. Over the past week he has barely had any oral intake, he has had vomiting with every oral intake, and has not been able to stay hydrated. He has also had cough productive of thick sputum. No chest pain. Per family, patient initially declined coming to the ED, today he was unable to get out of bed felt worse and so agreed to come in. Saw his outpatient provider a few days ago, was diagnosed with a sinus infection, started on a course of penicillin. CT chest revealed Moderate right and small left pleural effusions with partial compressive atelectasis of the lower lobes. Pneumonia is not excluded. ST consulted for clinical swallow evaluation with pt progressed to  Dysphagia 1(Puree)/thin liquids partially d/t pt request d/t missing dentition.  ST f/u for diet progression/dysphagia tx.      SLP Plan  Continue with current plan of care;Other (Comment) (potential objective study prior to D/c)        Swallow Evaluation Recommendations   Recommendations: PO diet PO Diet Recommendation: Dysphagia 1 (Pureed);Thin liquids (Level 0) Liquid Administration via: Cup;Straw;Other (Comment) (small sips) Medication Administration: Crushed with puree (or whole with puree if pt able) Supervision: Patient able to self-feed;Staff to assist with self-feeding;Intermittent supervision/cueing for swallowing strategies Postural changes: Position pt fully upright for meals Oral care recommendations: Oral care BID (2x/day);Staff/trained caregiver to provide oral care     Recommendations   PO diet (Dysphagia 1(Puree)/thin liquids with A with meals prn for set up/safety                   Oral care BID   Other (comment) (TBD) Dysphagia, unspecified (R13.10)     Continue with current plan of care;Other (Comment) (potential objective study prior to D/c)     Pat Hector Lowe,M.S.,CCC-SLP  05/14/2024, 4:24 PM

## 2024-05-14 NOTE — Plan of Care (Signed)
" °  Problem: Acute Rehab OT Goals (only OT should resolve) Goal: Pt. Will Perform Grooming Flowsheets (Taken 05/14/2024 1122) Pt Will Perform Grooming:  with modified independence  standing Goal: Pt. Will Perform Lower Body Bathing Flowsheets (Taken 05/14/2024 1122) Pt Will Perform Lower Body Bathing:  with modified independence  sitting/lateral leans Goal: Pt. Will Perform Lower Body Dressing Flowsheets (Taken 05/14/2024 1122) Pt Will Perform Lower Body Dressing:  with modified independence  sitting/lateral leans Goal: Pt. Will Transfer To Toilet Flowsheets (Taken 05/14/2024 1122) Pt Will Transfer to Toilet:  with modified independence  ambulating Goal: Pt. Will Perform Toileting-Clothing Manipulation Flowsheets (Taken 05/14/2024 1122) Pt Will Perform Toileting - Clothing Manipulation and hygiene:  with modified independence  sitting/lateral leans  sit to/from stand Goal: Pt/Caregiver Will Perform Home Exercise Program Flowsheets (Taken 05/14/2024 1122) Pt/caregiver will Perform Home Exercise Program:  Increased strength  Both right and left upper extremity  Independently  Jahlisa Rossitto OT, MOT  "

## 2024-05-14 NOTE — Plan of Care (Signed)
" °  Problem: Acute Rehab PT Goals(only PT should resolve) Goal: Pt Will Go Supine/Side To Sit Outcome: Progressing Flowsheets (Taken 05/14/2024 1418) Pt will go Supine/Side to Sit:  with modified independence  with supervision Goal: Patient Will Transfer Sit To/From Stand Outcome: Progressing Flowsheets (Taken 05/14/2024 1418) Patient will transfer sit to/from stand: with minimal assist Goal: Pt Will Transfer Bed To Chair/Chair To Bed Outcome: Progressing Flowsheets (Taken 05/14/2024 1418) Pt will Transfer Bed to Chair/Chair to Bed: with min assist Goal: Pt Will Ambulate Outcome: Progressing Flowsheets (Taken 05/14/2024 1418) Pt will Ambulate:  25 feet  with minimal assist  with rolling walker   2:19 PM, 05/14/2024 Lynwood Music, MPT Physical Therapist with [facility] [phone] office [phone] mobile phone  "

## 2024-05-15 DIAGNOSIS — E870 Hyperosmolality and hypernatremia: Secondary | ICD-10-CM | POA: Diagnosis not present

## 2024-05-15 LAB — CBC
HCT: 39.2 % (ref 39.0–52.0)
Hemoglobin: 11.9 g/dL — ABNORMAL LOW (ref 13.0–17.0)
MCH: 30.6 pg (ref 26.0–34.0)
MCHC: 30.4 g/dL (ref 30.0–36.0)
MCV: 100.8 fL — ABNORMAL HIGH (ref 80.0–100.0)
Platelets: 139 K/uL — ABNORMAL LOW (ref 150–400)
RBC: 3.89 MIL/uL — ABNORMAL LOW (ref 4.22–5.81)
RDW: 16.8 % — ABNORMAL HIGH (ref 11.5–15.5)
WBC: 8.3 K/uL (ref 4.0–10.5)
nRBC: 0.4 % — ABNORMAL HIGH (ref 0.0–0.2)

## 2024-05-15 LAB — BASIC METABOLIC PANEL WITH GFR
Anion gap: 11 (ref 5–15)
BUN: 50 mg/dL — ABNORMAL HIGH (ref 8–23)
CO2: 22 mmol/L (ref 22–32)
Calcium: 8.3 mg/dL — ABNORMAL LOW (ref 8.9–10.3)
Chloride: 110 mmol/L (ref 98–111)
Creatinine, Ser: 2.04 mg/dL — ABNORMAL HIGH (ref 0.61–1.24)
GFR, Estimated: 29 mL/min — ABNORMAL LOW
Glucose, Bld: 126 mg/dL — ABNORMAL HIGH (ref 70–99)
Potassium: 4.2 mmol/L (ref 3.5–5.1)
Sodium: 143 mmol/L (ref 135–145)

## 2024-05-15 LAB — GLUCOSE, CAPILLARY
Glucose-Capillary: 103 mg/dL — ABNORMAL HIGH (ref 70–99)
Glucose-Capillary: 116 mg/dL — ABNORMAL HIGH (ref 70–99)
Glucose-Capillary: 122 mg/dL — ABNORMAL HIGH (ref 70–99)
Glucose-Capillary: 72 mg/dL (ref 70–99)
Glucose-Capillary: 83 mg/dL (ref 70–99)
Glucose-Capillary: 90 mg/dL (ref 70–99)
Glucose-Capillary: 96 mg/dL (ref 70–99)

## 2024-05-15 LAB — MAGNESIUM: Magnesium: 2.2 mg/dL (ref 1.7–2.4)

## 2024-05-15 LAB — LACTIC ACID, PLASMA: Lactic Acid, Venous: 2.3 mmol/L (ref 0.5–1.9)

## 2024-05-15 MED ORDER — NAPHAZOLINE-GLYCERIN 0.012-0.25 % OP SOLN
1.0000 [drp] | Freq: Four times a day (QID) | OPHTHALMIC | Status: DC | PRN
  Filled 2024-05-15: qty 15

## 2024-05-15 MED ORDER — LACTATED RINGERS IV SOLN: INTRAVENOUS | Status: DC

## 2024-05-15 MED ORDER — SODIUM CHLORIDE 0.9 % IV SOLN
2.0000 g | INTRAVENOUS | Status: AC
  Administered 2024-05-15: 2 g via INTRAVENOUS
  Filled 2024-05-15: qty 20

## 2024-05-15 NOTE — Progress Notes (Addendum)
 " [name]  FMW:[id number] DOB: [birth year] DOA: 05/11/2024 PCP: [name], MD   Brief Narrative:    Hector Lowe is a 89 y.o. male with medical history significant for CKD 3, hypertension, gout. Patient was brought to the ED via EMS with reports of 1 week of progressive generalized weakness, sore throat, cough, nausea and vomiting and lower abdominal pain.  Over the past week he has barely had any oral intake, he has had vomiting with every oral intake, and has not been able to stay hydrated.  Patient was admitted with acute hypoxemic respiratory failure secondary to influenza A infection with likely superimposed pneumonia.  He also has new onset atrial fibrillation with RVR.  He currently continues to have significant amounts of weakness and poor oral intake with hypernatremia.  PT/OT evaluation recommending SNF on discharge, but he continues to have poor oral intake and palliative consulted.  Family members understand that he may require hospice should his intake not improve.  Assessment & Plan:   Principal Problem:   Hypernatremia Active Problems:   Influenza A   Acute hypoxic respiratory failure (HCC)   Atrial fibrillation with RVR (HCC)  Assessment and Plan:   1-influenza A infection with presumed superimposed pneumonia - Treatment with Tamiflu  completed - Continue IV antibiotics currently day 5/5, can discontinue after today - Continue mucolytics. - Continue oxygen supplementation and wean off as tolerated. - Continue supportive care - Continue to follow clinical response. - Continue hydration due to poor oral intake -Monitor lactic acidosis   2-acute hypoxic respiratory failure - Secondary to problem #1 - Desaturation screening demonstrating the need of 2 L nasal cannula supplementation - Will need oxygen at discharge   3-new onset atrial fibrillation with RVR at time of admission - Excellent response to initial treatment using IV  Lopressor  - Continue oral metoprolol  - 2D echo demonstrating preserved ejection fraction and no significant valvular disorder. - Continue aspirin  for secondary prevention - Follow electrolytes and maintain magnesium above 2 and potassium above 4. - Telemetry evaluation demonstrating atrial fibrillation.   4-chronic kidney disease stage IIIb - Appears to be close to his baseline, but creatinine starting to elevate likely due to dehydration - Continue to maintain adequate hydration - Continue minimize nephrotoxic agents - Will continue to follow renal function trend especially with LR IV fluid   5-hypertension - Stable for the most part - Patient has not been taking medication for about a week prior to admission - Continue the use of metoprolol  for now - Continue holding Norvasc  and Imdur  for now; in order to prevent hypotension. - Follow vital signs.   6-severe protein calorie malnutrition -Body mass index is 14.68 kg/m.  -For now using liquid diet - Speech therapy has been contacted - Continue supportive care. - Feeding supplements twice a day between meals have been started. - Concern for failure to thrive noted and appreciate palliative consultation  7-hypernatremia - Resolved, continue now on LR  8-weakness/deconditioning/anorexia - Plan for potential SNF if starting to eat more    DVT prophylaxis:Heparin  Code Status: DNR Family Communication: Son as well as other family members at bedside 12/25 Disposition Plan:  Status is: Inpatient Remains inpatient appropriate because: Need for IV medications and fluids.   Consultants:  Palliative care  Procedures:  None  Antimicrobials:  Anti-infectives (From admission, onward)    Start     Dose/Rate Route Frequency Ordered Stop   05/15/24 1000  cefTRIAXone  (ROCEPHIN ) 2 g  in sodium chloride  0.9 % 100 mL IVPB        2 g 200 mL/hr over 30 Minutes Intravenous Every 24 hours 05/15/24 0848 05/15/24 0958   05/12/24 1500   cefTRIAXone  (ROCEPHIN ) 2 g in sodium chloride  0.9 % 100 mL IVPB  Status:  Discontinued        2 g 200 mL/hr over 30 Minutes Intravenous Every 24 hours 05/11/24 1700 05/15/24 0848   05/11/24 1615  oseltamivir  (TAMIFLU ) capsule 75 mg  Status:  Discontinued       Note to Pharmacy: Pls adjust dose as indicated, thanks.   75 mg Oral Daily 05/11/24 1600 05/11/24 1611   05/11/24 1615  oseltamivir  (TAMIFLU ) capsule 30 mg       Note to Pharmacy: Pls adjust dose as indicated, thanks.   30 mg Oral Daily 05/11/24 1611 05/15/24 0900   05/11/24 1530  cefTRIAXone  (ROCEPHIN ) 1 g in sodium chloride  0.9 % 100 mL IVPB        1 g 200 mL/hr over 30 Minutes Intravenous  Once 05/11/24 1519 05/11/24 1604   05/11/24 1530  azithromycin  (ZITHROMAX ) 500 mg in sodium chloride  0.9 % 250 mL IVPB        500 mg 250 mL/hr over 60 Minutes Intravenous Every 24 hours 05/11/24 1519 05/15/24 2300       Subjective: Patient seen and evaluated today with no new acute complaints or concerns. No acute concerns or events noted overnight.  Patient noted to be quite weak and still has poor oral intake.  Son at bedside concerned about discharge and states that he may require rehabilitation.  Objective: Vitals:   05/14/24 1226 05/14/24 1254 05/14/24 2031 05/15/24 1237  BP: 100/70  (!) 124/92 115/80  Pulse: 77  83 80  Resp:      Temp:  (!) 97.5 F (36.4 C) 98.3 F (36.8 C) 97.9 F (36.6 C)  TempSrc:  Rectal Oral Oral  SpO2: 100%  100% 98%  Weight:      Height:        Intake/Output Summary (Last 24 hours) at 05/15/2024 1254 Last data filed at 05/14/2024 1700 Gross per 24 hour  Intake 50 ml  Output --  Net 50 ml   Filed Weights   05/11/24 1710  Weight: 46.4 kg    Examination:  General exam: Appears calm and comfortable, hard of hearing Respiratory system: Clear to auscultation. Respiratory effort normal.  Cardiovascular system: S1 & S2 heard, RRR.  Gastrointestinal system: Abdomen is soft Central nervous system:  Alert and awake Extremities: No edema Skin: No significant lesions noted Psychiatry: Flat affect.    Data Reviewed: I have personally reviewed following labs and imaging studies  CBC: Recent Labs  Lab 05/11/24 1236 05/13/24 0451 05/14/24 0744 05/15/24 0451  WBC 9.0 8.1 11.3* 8.3  NEUTROABS 6.9  --   --   --   HGB 15.6 12.3* 13.2 11.9*  HCT 52.3* 40.4 43.7 39.2  MCV 101.2* 100.0 101.2* 100.8*  PLT 161 128* 156 139*   Basic Metabolic Panel: Recent Labs  Lab 05/11/24 1349 05/11/24 1434 05/13/24 0451 05/14/24 0744 05/15/24 0451  NA 155*  --  149* 148* 143  K 4.6  --  4.5 4.9 4.2  CL 117*  --  117* 115* 110  CO2 18*  --  16* 18* 22  GLUCOSE 79  --  65* 92 126*  BUN 37*  --  47* 55* 50*  CREATININE 1.88*  --  1.87* 1.95* 2.04*  CALCIUM  9.4  --  8.3* 8.8* 8.3*  MG  --  2.3  --  2.4 2.2   GFR: Estimated Creatinine Clearance: 12.6 mL/min (A) (by C-G formula based on SCr of 2.04 mg/dL (H)). Liver Function Tests: Recent Labs  Lab 05/11/24 1349  AST 36  ALT 18  ALKPHOS 162*  BILITOT 1.2  PROT 7.5  ALBUMIN 3.6   Recent Labs  Lab 05/11/24 1349  LIPASE 37   No results for input(s): AMMONIA in the last 168 hours. Coagulation Profile: No results for input(s): INR, PROTIME in the last 168 hours. Cardiac Enzymes: No results for input(s): CKTOTAL, CKMB, CKMBINDEX, TROPONINI in the last 168 hours. BNP (last 3 results) No results for input(s): PROBNP in the last 8760 hours. HbA1C: No results for input(s): HGBA1C in the last 72 hours. CBG: Recent Labs  Lab 05/14/24 1932 05/15/24 0006 05/15/24 0405 05/15/24 0718 05/15/24 1116  GLUCAP 111* 122* 103* 116* 83   Lipid Profile: No results for input(s): CHOL, HDL, LDLCALC, TRIG, CHOLHDL, LDLDIRECT in the last 72 hours. Thyroid  Function Tests: No results for input(s): TSH, T4TOTAL, FREET4, T3FREE, THYROIDAB in the last 72 hours.  Anemia Panel: No results for input(s):  VITAMINB12, FOLATE, FERRITIN, TIBC, IRON, RETICCTPCT in the last 72 hours. Sepsis Labs: Recent Labs  Lab 05/11/24 1349 05/11/24 1434 05/11/24 1622 05/14/24 0744 05/15/24 0451  PROCALCITON  --  0.25  --   --   --   LATICACIDVEN 3.3*  --  3.1* 2.5* 2.3*    Recent Results (from the past 240 hours)  Resp panel by RT-PCR (RSV, Flu A&B, Covid) Anterior Nasal Swab     Status: Abnormal   Collection Time: 05/11/24 12:36 PM   Specimen: Anterior Nasal Swab  Result Value Ref Range Status   SARS Coronavirus 2 by RT PCR NEGATIVE NEGATIVE Final    Comment: (NOTE) SARS-CoV-2 target nucleic acids are NOT DETECTED.  The SARS-CoV-2 RNA is generally detectable in upper respiratory specimens during the acute phase of infection. The lowest concentration of SARS-CoV-2 viral copies this assay can detect is 138 copies/mL. A negative result does not preclude SARS-Cov-2 infection and should not be used as the sole basis for treatment or other patient management decisions. A negative result may occur with  improper specimen collection/handling, submission of specimen other than nasopharyngeal swab, presence of viral mutation(s) within the areas targeted by this assay, and inadequate number of viral copies(<138 copies/mL). A negative result must be combined with clinical observations, patient history, and epidemiological information. The expected result is Negative.  Fact Sheet for Patients:  bloggercourse.com  Fact Sheet for Healthcare Providers:  seriousbroker.it  This test is no t yet approved or cleared by the [place]  FDA and  has been authorized for detection and/or diagnosis of SARS-CoV-2 by FDA under an Emergency Use Authorization (EUA). This EUA will remain  in effect (meaning this test can be used) for the duration of the COVID-19 declaration under Section 564(b)(1) of the Act, 21 U.S.C.section 360bbb-3(b)(1), unless the  authorization is terminated  or revoked sooner.       Influenza A by PCR POSITIVE (A) NEGATIVE Final   Influenza B by PCR NEGATIVE NEGATIVE Final    Comment: (NOTE) The Xpert Xpress SARS-CoV-2/FLU/RSV plus assay is intended as an aid in the diagnosis of influenza from Nasopharyngeal swab specimens and should not be used as a sole basis for treatment. Nasal washings and aspirates are unacceptable for Xpert Xpress SARS-CoV-2/FLU/RSV testing.  Fact Sheet for Patients:  bloggercourse.com  Fact Sheet for Healthcare Providers: seriousbroker.it  This test is not yet approved or cleared by the [place]  FDA and has been authorized for detection and/or diagnosis of SARS-CoV-2 by FDA under an Emergency Use Authorization (EUA). This EUA will remain in effect (meaning this test can be used) for the duration of the COVID-19 declaration under Section 564(b)(1) of the Act, 21 U.S.C. section 360bbb-3(b)(1), unless the authorization is terminated or revoked.     Resp Syncytial Virus by PCR NEGATIVE NEGATIVE Final    Comment: (NOTE) Fact Sheet for Patients: bloggercourse.com  Fact Sheet for Healthcare Providers: seriousbroker.it  This test is not yet approved or cleared by the [place]  FDA and has been authorized for detection and/or diagnosis of SARS-CoV-2 by FDA under an Emergency Use Authorization (EUA). This EUA will remain in effect (meaning this test can be used) for the duration of the COVID-19 declaration under Section 564(b)(1) of the Act, 21 U.S.C. section 360bbb-3(b)(1), unless the authorization is terminated or revoked.  Performed at [facility], [address], [place], [place] [address]   Culture, blood (routine x 2)     Status: None (Preliminary result)   Collection Time: 05/11/24  4:22 PM   Specimen: BLOOD LEFT FOREARM  Result Value Ref Range Status   Specimen  Description BLOOD LEFT FOREARM AEROBIC BOTTLE ONLY  Final   Special Requests   Final    Blood Culture results may not be optimal due to an inadequate volume of blood received in culture bottles   Culture   Final    NO GROWTH 4 DAYS Performed at [facility], [address] [place], [place] [address]    Report Status PENDING  Incomplete  Culture, blood (routine x 2)     Status: None (Preliminary result)   Collection Time: 05/11/24  4:22 PM   Specimen: Left Antecubital; Blood  Result Value Ref Range Status   Specimen Description   Final    LEFT ANTECUBITAL BOTTLES DRAWN AEROBIC AND ANAEROBIC   Special Requests   Final    Blood Culture results may not be optimal due to an inadequate volume of blood received in culture bottles   Culture   Final    NO GROWTH 4 DAYS Performed at [facility], [address], [place], [place] [address]    Report Status PENDING  Incomplete         Radiology Studies: No results found.       Scheduled Meds:  aspirin   81 mg Oral Daily   feeding supplement  237 mL Oral BID BM   heparin   5,000 Units Subcutaneous Q8H   metoprolol  tartrate  25 mg Oral BID   mouth rinse  15 mL Mouth Rinse 4 times per day   Continuous Infusions:  azithromycin  500 mg (05/14/24 1506)   lactated ringers  75 mL/hr at 05/15/24 1021     LOS: 4 days    Time spent: 55 minutes    Armanii Pressnell D Maree, DO [facility]  If 7PM-7AM, please contact night-coverage [email or web address] 05/15/2024, 12:54 PM   "

## 2024-05-15 NOTE — Plan of Care (Signed)
  Problem: Education: Goal: Knowledge of General Education information will improve Description: Including pain rating scale, medication(s)/side effects and non-pharmacologic comfort measures Outcome: Progressing   Problem: Clinical Measurements: Goal: Ability to maintain clinical measurements within normal limits will improve Outcome: Progressing   Problem: Health Behavior/Discharge Planning: Goal: Ability to manage health-related needs will improve Outcome: Not Progressing

## 2024-05-16 DIAGNOSIS — E870 Hyperosmolality and hypernatremia: Secondary | ICD-10-CM | POA: Diagnosis not present

## 2024-05-16 DIAGNOSIS — N1832 Chronic kidney disease, stage 3b: Secondary | ICD-10-CM

## 2024-05-16 DIAGNOSIS — Z681 Body mass index (BMI) 19 or less, adult: Secondary | ICD-10-CM

## 2024-05-16 DIAGNOSIS — Z66 Do not resuscitate: Secondary | ICD-10-CM

## 2024-05-16 DIAGNOSIS — E43 Unspecified severe protein-calorie malnutrition: Secondary | ICD-10-CM

## 2024-05-16 DIAGNOSIS — J1000 Influenza due to other identified influenza virus with unspecified type of pneumonia: Secondary | ICD-10-CM

## 2024-05-16 LAB — GLUCOSE, CAPILLARY
Glucose-Capillary: 66 mg/dL — ABNORMAL LOW (ref 70–99)
Glucose-Capillary: 56 mg/dL — ABNORMAL LOW (ref 70–99)
Glucose-Capillary: 133 mg/dL — ABNORMAL HIGH (ref 70–99)
Glucose-Capillary: 118 mg/dL — ABNORMAL HIGH (ref 70–99)
Glucose-Capillary: 85 mg/dL (ref 70–99)
Glucose-Capillary: 84 mg/dL (ref 70–99)
Glucose-Capillary: 67 mg/dL — ABNORMAL LOW (ref 70–99)
Glucose-Capillary: 76 mg/dL (ref 70–99)

## 2024-05-16 LAB — CULTURE, BLOOD (ROUTINE X 2)
Culture: NO GROWTH
Culture: NO GROWTH

## 2024-05-16 LAB — CBC
HCT: 39.8 % (ref 39.0–52.0)
Hemoglobin: 12 g/dL — ABNORMAL LOW (ref 13.0–17.0)
MCH: 30.3 pg (ref 26.0–34.0)
MCHC: 30.2 g/dL (ref 30.0–36.0)
MCV: 100.5 fL — ABNORMAL HIGH (ref 80.0–100.0)
Platelets: 151 K/uL (ref 150–400)
RBC: 3.96 MIL/uL — ABNORMAL LOW (ref 4.22–5.81)
RDW: 16.3 % — ABNORMAL HIGH (ref 11.5–15.5)
WBC: 8.1 K/uL (ref 4.0–10.5)
nRBC: 0.6 % — ABNORMAL HIGH (ref 0.0–0.2)

## 2024-05-16 LAB — BASIC METABOLIC PANEL WITH GFR
Anion gap: 11 (ref 5–15)
BUN: 41 mg/dL — ABNORMAL HIGH (ref 8–23)
CO2: 22 mmol/L (ref 22–32)
Calcium: 8.6 mg/dL — ABNORMAL LOW (ref 8.9–10.3)
Chloride: 110 mmol/L (ref 98–111)
Creatinine, Ser: 1.66 mg/dL — ABNORMAL HIGH (ref 0.61–1.24)
GFR, Estimated: 37 mL/min — ABNORMAL LOW
Glucose, Bld: 74 mg/dL (ref 70–99)
Potassium: 4.1 mmol/L (ref 3.5–5.1)
Sodium: 143 mmol/L (ref 135–145)

## 2024-05-16 LAB — MAGNESIUM: Magnesium: 2.3 mg/dL (ref 1.7–2.4)

## 2024-05-16 LAB — LACTIC ACID, PLASMA: Lactic Acid, Venous: 2.3 mmol/L (ref 0.5–1.9)

## 2024-05-16 MED ORDER — DEXTROSE 50 % IV SOLN
12.5000 g | INTRAVENOUS | Status: AC
  Administered 2024-05-16: 12.5 g via INTRAVENOUS
  Filled 2024-05-16: qty 50

## 2024-05-16 NOTE — Progress Notes (Signed)
 Mobility Specialist Progress Note:    05/16/24 0955  Mobility  Activity Ambulated with assistance  Level of Assistance Minimal assist, patient does 75% or more  Assistive Device Front wheel walker  Distance Ambulated (ft) 12 ft  Range of Motion/Exercises Active;All extremities  Activity Response Tolerated well  Mobility Referral Yes  Mobility visit 1 Mobility  Mobility Specialist Start Time (ACUTE ONLY) 0955  Mobility Specialist Stop Time (ACUTE ONLY) 1015  Mobility Specialist Time Calculation (min) (ACUTE ONLY) 20 min   Pt received supine, requesting assistance to bathroom. Required MinA to stand and ambulate with RW. Tolerated well, family in room. Left in chair, alarm on. All needs met.  Tamon Parkerson Mobility Specialist Please contact via Special Educational Needs Teacher or  Rehab office at [phone]

## 2024-05-16 NOTE — TOC Progression Note (Signed)
 Transition of Care [facility]) - Progression Note    Patient Details  Name: Hector Lowe MRN: [id number] Date of Birth: 09/22/1921  Transition of [facility]) CM/SW Contact  Sharlyne Stabs, RN Phone Number: 05/16/2024, 1:36 PM  Clinical Narrative:   Patient only has one bed offer. IPCM following. Palliative and family discussed reassessing in a couple of days and if no improvement patient may need hospice.    Expected Discharge Plan: Skilled Nursing Facility Barriers to Discharge: Continued Medical Work up      Expected Discharge Plan and Services In-house Referral: Clinical Social Work Discharge Planning Services: CM Consult Post Acute Care Choice: Durable Medical Equipment Living arrangements for the past 2 months: Single Family Home                 DME Arranged: Oxygen DME Agency: [employer] Date DME Agency Contacted: 05/13/24 Time DME Agency Contacted: 1525 Representative spoke with at DME Agency: Darlyn             Social Drivers of Health (SDOH) Interventions SDOH Screenings   Food Insecurity: Patient Unable To Answer (05/11/2024)  Housing: Unknown (05/11/2024)  Transportation Needs: Patient Unable To Answer (05/11/2024)  Utilities: Patient Unable To Answer (05/11/2024)  Social Connections: Patient Unable To Answer (05/11/2024)  Tobacco Use: High Risk (05/11/2024)    Readmission Risk Interventions    05/14/2024   12:50 PM  Readmission Risk Prevention Plan  Transportation Screening Complete  Home Care Screening Complete  Medication Review (RN CM) Complete

## 2024-05-16 NOTE — Plan of Care (Signed)
" °  Problem: Education: Goal: Knowledge of General Education information will improve Description: Including pain rating scale, medication(s)/side effects and non-pharmacologic comfort measures Outcome: Progressing   Problem: Health Behavior/Discharge Planning: Goal: Ability to manage health-related needs will improve Outcome: Progressing   Problem: Clinical Measurements: Goal: Ability to maintain clinical measurements within normal limits will improve Outcome: Progressing Goal: Will remain free from infection Outcome: Progressing Goal: Diagnostic test results will improve Outcome: Progressing Goal: Respiratory complications will improve Outcome: Progressing   Problem: Activity: Goal: Risk for activity intolerance will decrease Outcome: Progressing   Problem: Nutrition: Goal: Adequate nutrition will be maintained Outcome: Progressing   Problem: Coping: Goal: Level of anxiety will decrease Outcome: Progressing   Problem: Elimination: Goal: Will not experience complications related to urinary retention Outcome: Progressing   Problem: Safety: Goal: Ability to remain free from injury will improve Outcome: Progressing   Problem: Skin Integrity: Goal: Risk for impaired skin integrity will decrease Outcome: Progressing   Problem: Activity: Goal: Ability to tolerate increased activity will improve Outcome: Progressing   Problem: Clinical Measurements: Goal: Ability to maintain a body temperature in the normal range will improve Outcome: Progressing   Problem: Respiratory: Goal: Ability to maintain adequate ventilation will improve Outcome: Progressing Goal: Ability to maintain a clear airway will improve Outcome: Progressing   Problem: Education: Goal: Knowledge of disease or condition will improve Outcome: Progressing Goal: Understanding of medication regimen will improve Outcome: Progressing Goal: Individualized Educational Video(s) Outcome: Progressing    Problem: Activity: Goal: Ability to tolerate increased activity will improve Outcome: Progressing   Problem: Cardiac: Goal: Ability to achieve and maintain adequate cardiopulmonary perfusion will improve Outcome: Progressing   "

## 2024-05-16 NOTE — Progress Notes (Signed)
 " [name]  FMW:[id number] DOB: [birth year] DOA: 05/11/2024 PCP: [name], MD   Brief Narrative:    Hector Lowe is a 89 y.o. male with medical history significant for CKD 3, hypertension, gout. Patient was brought to the ED via EMS with reports of 1 week of progressive generalized weakness, sore throat, cough, nausea and vomiting and lower abdominal pain.  Over the past week he has barely had any oral intake, he has had vomiting with every oral intake, and has not been able to stay hydrated.  Patient was admitted with acute hypoxemic respiratory failure secondary to influenza A infection with likely superimposed pneumonia.  He also has new onset atrial fibrillation with RVR.  He currently continues to have significant amounts of weakness and poor oral intake with hypernatremia.  PT/OT evaluation recommending SNF on discharge, but he continues to have poor oral intake and palliative consulted.  Family members understand that he may require hospice should his intake not improve.  Assessment & Plan:   Principal Problem:   Hypernatremia Active Problems:   Influenza A   Acute hypoxic respiratory failure (HCC)   Atrial fibrillation with RVR (HCC)  Assessment and Plan:   1-influenza A infection with presumed superimposed pneumonia - Treatment with Tamiflu  completed -Completed 5 days of appropriate antibiotics for pneumonia - Continue mucolytics. - Continue oxygen supplementation and wean off as tolerated. - Continue supportive care - Continue to follow clinical response. - Continue IV LR/hydration due to poor oral intake - Oral intake remains poor as of 05/16/2024   2-acute hypoxic respiratory failure - Secondary to problem #1 -Currently requiring 2 L of oxygen via nasal cannula - Try to wean off O2 as able   3-new onset atrial fibrillation with RVR at time of admission - Excellent response to initial treatment using IV Lopressor  - Continue oral  metoprolol  - 2D echo demonstrating preserved ejection fraction and no significant valvular disorder. - Continue aspirin  for secondary prevention - Follow electrolytes and maintain magnesium above 2 and potassium above 4.  4-chronic kidney disease stage IIIb - Appears to be close to his baseline,  Renally adjust medications, avoid nephrotoxic agents / dehydration  / hypotension   5-hypertension - Stable for the most part - Patient has not been taking medication for about a week prior to admission - Continue the use of metoprolol  for now - Continue holding Norvasc  and Imdur  for now; in order to prevent hypotension. - Follow vital signs.   6-severe protein calorie malnutrition -Body mass index is 14.68 kg/m.  -For now using liquid diet - Speech therapy has been contacted - Continue supportive care. - Feeding supplements twice a day between meals have been started. - Concern for failure to thrive noted and appreciate palliative consultation  7-hypernatremia - Resolved, continue now on LR due to poor oral intake and risk for dehydration  8-weakness/deconditioning/anorexia - Plan for potential SNF if starting to eat more, otherwise may transition to comfort care/hospice    DVT prophylaxis:Heparin  Code Status: DNR Family Communication: Son and granddaughter are primary contact disposition Plan: Possible transition to hospice/comfort care if unable to tolerate oral intake Status is: Inpatient  Consultants:  Palliative care  Antimicrobials:  Anti-infectives (From admission, onward)    Start     Dose/Rate Route Frequency Ordered Stop   05/15/24 1000  cefTRIAXone  (ROCEPHIN ) 2 g in sodium chloride  0.9 % 100 mL IVPB        2 g 200 mL/hr over  30 Minutes Intravenous Every 24 hours 05/15/24 0848 05/15/24 0958   05/12/24 1500  cefTRIAXone  (ROCEPHIN ) 2 g in sodium chloride  0.9 % 100 mL IVPB  Status:  Discontinued        2 g 200 mL/hr over 30 Minutes Intravenous Every 24 hours 05/11/24  1700 05/15/24 0848   05/11/24 1615  oseltamivir  (TAMIFLU ) capsule 75 mg  Status:  Discontinued       Note to Pharmacy: Pls adjust dose as indicated, thanks.   75 mg Oral Daily 05/11/24 1600 05/11/24 1611   05/11/24 1615  oseltamivir  (TAMIFLU ) capsule 30 mg       Note to Pharmacy: Pls adjust dose as indicated, thanks.   30 mg Oral Daily 05/11/24 1611 05/15/24 0900   05/11/24 1530  cefTRIAXone  (ROCEPHIN ) 1 g in sodium chloride  0.9 % 100 mL IVPB        1 g 200 mL/hr over 30 Minutes Intravenous  Once 05/11/24 1519 05/11/24 1604   05/11/24 1530  azithromycin  (ZITHROMAX ) 500 mg in sodium chloride  0.9 % 250 mL IVPB        500 mg 250 mL/hr over 60 Minutes Intravenous Every 24 hours 05/11/24 1519 05/15/24 1616       Subjective: -Granddaughter and palliative care provider at bedside =-Oral intake remains poor/challenging  No Nausea, Vomiting or Diarrhea  Objective: Vitals:   05/15/24 1237 05/15/24 1936 05/16/24 0343 05/16/24 1438  BP: 115/80 121/82 119/82 112/84  Pulse: 80 84 78 (!) 55  Resp:  16    Temp: 97.9 F (36.6 C) 97.6 F (36.4 C) (!) 97.3 F (36.3 C) 98.6 F (37 C)  TempSrc: Oral Axillary Axillary Oral  SpO2: 98% 100%  94%  Weight:      Height:        Intake/Output Summary (Last 24 hours) at 05/16/2024 1923 Last data filed at 05/16/2024 0252 Gross per 24 hour  Intake 861.41 ml  Output --  Net 861.41 ml   Filed Weights   05/11/24 1710  Weight: 46.4 kg    Physical Exam  Gen:-Awake Alert, in no acute distress,frail, emaciated/cachectic appearing HEENT:- [place].AT, No sclera icterus Nose- [place] 2L/min Neck-Supple Neck,No JVD,.  Lungs-  CTAB , fair air movement bilaterally  CV- S1, S2 normal, RRR Abd-  +ve B.Sounds, Abd Soft, No tenderness,    Extremity/Skin:- No  edema,   good pedal pulses  Psych-affect is flat,, cognitive and memory deficits, Neuro-generalized weakness, no new focal deficits, no tremors  Data Reviewed: I have personally reviewed following labs  and imaging studies  CBC: Recent Labs  Lab 05/11/24 1236 05/13/24 0451 05/14/24 0744 05/15/24 0451 05/16/24 0441  WBC 9.0 8.1 11.3* 8.3 8.1  NEUTROABS 6.9  --   --   --   --   HGB 15.6 12.3* 13.2 11.9* 12.0*  HCT 52.3* 40.4 43.7 39.2 39.8  MCV 101.2* 100.0 101.2* 100.8* 100.5*  PLT 161 128* 156 139* 151   Basic Metabolic Panel: Recent Labs  Lab 05/11/24 1349 05/11/24 1434 05/13/24 0451 05/14/24 0744 05/15/24 0451 05/16/24 0441  NA 155*  --  149* 148* 143 143  K 4.6  --  4.5 4.9 4.2 4.1  CL 117*  --  117* 115* 110 110  CO2 18*  --  16* 18* 22 22  GLUCOSE 79  --  65* 92 126* 74  BUN 37*  --  47* 55* 50* 41*  CREATININE 1.88*  --  1.87* 1.95* 2.04* 1.66*  CALCIUM  9.4  --  8.3* 8.8* 8.3* 8.6*  MG  --  2.3  --  2.4 2.2 2.3   GFR: Estimated Creatinine Clearance: 15.5 mL/min (A) (by C-G formula based on SCr of 1.66 mg/dL (H)). Liver Function Tests: Recent Labs  Lab 05/11/24 1349  AST 36  ALT 18  ALKPHOS 162*  BILITOT 1.2  PROT 7.5  ALBUMIN 3.6   Recent Labs  Lab 05/11/24 1349  LIPASE 37   CBG: Recent Labs  Lab 05/16/24 0451 05/16/24 0546 05/16/24 0707 05/16/24 1120 05/16/24 1737  GLUCAP 56* 133* 118* 85 84   Sepsis Labs: Recent Labs  Lab 05/11/24 1434 05/11/24 1622 05/14/24 0744 05/15/24 0451 05/16/24 0441  PROCALCITON 0.25  --   --   --   --   LATICACIDVEN  --  3.1* 2.5* 2.3* 2.3*    Recent Results (from the past 240 hours)  Resp panel by RT-PCR (RSV, Flu A&B, Covid) Anterior Nasal Swab     Status: Abnormal   Collection Time: 05/11/24 12:36 PM   Specimen: Anterior Nasal Swab  Result Value Ref Range Status   SARS Coronavirus 2 by RT PCR NEGATIVE NEGATIVE Final    Comment: (NOTE) SARS-CoV-2 target nucleic acids are NOT DETECTED.  The SARS-CoV-2 RNA is generally detectable in upper respiratory specimens during the acute phase of infection. The lowest concentration of SARS-CoV-2 viral copies this assay can detect is 138 copies/mL. A  negative result does not preclude SARS-Cov-2 infection and should not be used as the sole basis for treatment or other patient management decisions. A negative result may occur with  improper specimen collection/handling, submission of specimen other than nasopharyngeal swab, presence of viral mutation(s) within the areas targeted by this assay, and inadequate number of viral copies(<138 copies/mL). A negative result must be combined with clinical observations, patient history, and epidemiological information. The expected result is Negative.  Fact Sheet for Patients:  bloggercourse.com  Fact Sheet for Healthcare Providers:  seriousbroker.it  This test is no t yet approved or cleared by the [place]  FDA and  has been authorized for detection and/or diagnosis of SARS-CoV-2 by FDA under an Emergency Use Authorization (EUA). This EUA will remain  in effect (meaning this test can be used) for the duration of the COVID-19 declaration under Section 564(b)(1) of the Act, 21 U.S.C.section 360bbb-3(b)(1), unless the authorization is terminated  or revoked sooner.       Influenza A by PCR POSITIVE (A) NEGATIVE Final   Influenza B by PCR NEGATIVE NEGATIVE Final    Comment: (NOTE) The Xpert Xpress SARS-CoV-2/FLU/RSV plus assay is intended as an aid in the diagnosis of influenza from Nasopharyngeal swab specimens and should not be used as a sole basis for treatment. Nasal washings and aspirates are unacceptable for Xpert Xpress SARS-CoV-2/FLU/RSV testing.  Fact Sheet for Patients: bloggercourse.com  Fact Sheet for Healthcare Providers: seriousbroker.it  This test is not yet approved or cleared by the [place]  FDA and has been authorized for detection and/or diagnosis of SARS-CoV-2 by FDA under an Emergency Use Authorization (EUA). This EUA will remain in effect (meaning this test  can be used) for the duration of the COVID-19 declaration under Section 564(b)(1) of the Act, 21 U.S.C. section 360bbb-3(b)(1), unless the authorization is terminated or revoked.     Resp Syncytial Virus by PCR NEGATIVE NEGATIVE Final    Comment: (NOTE) Fact Sheet for Patients: bloggercourse.com  Fact Sheet for Healthcare Providers: seriousbroker.it  This test is not yet approved or cleared by the [place]   FDA and has been authorized for detection and/or diagnosis of SARS-CoV-2 by FDA under an Emergency Use Authorization (EUA). This EUA will remain in effect (meaning this test can be used) for the duration of the COVID-19 declaration under Section 564(b)(1) of the Act, 21 U.S.C. section 360bbb-3(b)(1), unless the authorization is terminated or revoked.  Performed at [facility], [address], [place], [place] [address]   Culture, blood (routine x 2)     Status: None   Collection Time: 05/11/24  4:22 PM   Specimen: BLOOD LEFT FOREARM  Result Value Ref Range Status   Specimen Description BLOOD LEFT FOREARM AEROBIC BOTTLE ONLY  Final   Special Requests   Final    Blood Culture results may not be optimal due to an inadequate volume of blood received in culture bottles   Culture   Final    NO GROWTH 5 DAYS Performed at [facility], [address], [place], [place] [address]    Report Status 05/16/2024 FINAL  Final  Culture, blood (routine x 2)     Status: None   Collection Time: 05/11/24  4:22 PM   Specimen: Left Antecubital; Blood  Result Value Ref Range Status   Specimen Description   Final    LEFT ANTECUBITAL BOTTLES DRAWN AEROBIC AND ANAEROBIC   Special Requests   Final    Blood Culture results may not be optimal due to an inadequate volume of blood received in culture bottles   Culture   Final    NO GROWTH 5 DAYS Performed at [facility], [address], [place], [place] [address]    Report Status 05/16/2024 FINAL   Final    Scheduled Meds:  aspirin   81 mg Oral Daily   feeding supplement  237 mL Oral BID BM   heparin   5,000 Units Subcutaneous Q8H   metoprolol  tartrate  25 mg Oral BID   mouth rinse  15 mL Mouth Rinse 4 times per day   Continuous Infusions:  lactated ringers  Stopped (05/15/24 1510)     LOS: 5 days   Dnasia Gauna,MD [facility]  If 7PM-7AM, please contact night-coverage [email or web address] 05/16/2024, 7:23 PM   "

## 2024-05-16 NOTE — Consult Note (Signed)
 "                                                  Palliative Care Consult Note                                  Date: 05/16/2024   Patient Name: Hector Lowe  DOB: 25-Dec-1921  MRN: [id number]  Age / Sex: 89 y.o., male  PCP: [name], MD Referring Physician: Pearlean Manus, MD  Reason for Consultation: Establishing goals of care  Past Medical History:  Diagnosis Date   Acid reflux    CAD (coronary artery disease) 06/12/2021   CKD (chronic kidney disease) stage 3, GFR 30-59 ml/min (HCC) 03/08/2020   Constipation 03/08/2020   Essential hypertension 03/08/2020   GERD (gastroesophageal reflux disease) 03/08/2020   Gout flare 03/09/2020   Hyperlipidemia LDL goal <70 06/12/2021   Hypertension    Hyponatremia 03/08/2020   Ileus (HCC) 03/07/2020   Lower abdominal pain 09/01/2020   Mild AKI 06/12/2021   Nicotine dependence, chewing tobacco, w oth disorders 03/08/2020   NSTEMI (non-ST elevated myocardial infarction) (HCC) 06/10/2021    Subjective:   This NP Camellia Kays reviewed medical records, received report from team, assessed the patient and then meet at the patient's bedside to discuss diagnosis, prognosis, GOC, EOL wishes disposition and options.  Before meeting with the patient/family, I spent time reviewing the chart notes including nursing note from yesterday, hospitalist note from yesterday, nurse note from today, mobility note from today. I also reviewed vital signs, nursing flowsheets, medication administrations record, labs, and imaging. Labs reviewed include CBC which shows normalized white count at 8.1 (was elevated 11.3 two days ago in the setting of flu A with superimposed pneumonia).  BMP does show some improvement in creatinine at 1.66 (was 2.04 yesterday) in the setting of poor oral intake and dehydration with CKD 3B at baseline  I met with the patient at the bedside, although he is not meaningfully communicating currently.  Family states that he was or out by  getting into the bedside chair.  His granddaughter is present and she initiated a FaceTime call with the patient's son.   We meet to discuss diagnosis prognosis, GOC, EOL wishes, disposition and options. Concept of Palliative Care was introduced as specialized medical care for people and their families living with serious illness.  If focuses on providing relief from the symptoms and stress of a serious illness.  The goal is to improve quality of life for both the patient and the family. Values and goals of care important to patient and family were attempted to be elicited.  Created space and opportunity for patient  and family to explore thoughts and feelings regarding current medical situation   Natural trajectory and current clinical status were discussed. Questions and concerns addressed. Patient  encouraged to call with questions or concerns.    Patient/Family Understanding of Illness: Family knows that his kidney levels are not good, he is on fluids but still dehydrated.  Son states that he can see him getting weaker.  However, they note that his mind is still sound.  We spent time talking about chronic illness and acute presentation.  We talked about his frailty and cachexia as well as limited reserve likely making  it difficult for him to bounce back from acute illness such as this.  Life Review: The patient has a son and a granddaughter who lives locally.  Today's Discussion: In addition to discussions described above we spent substantial time having discussions on various topics.  We spent a lot of time talking about the patient at his baseline with progressive weakness, malnutrition, and the major obstacle currently of poor oral intake.  Patient's family shares that they come to the hospital at least twice a day during mealtimes and try to encourage him to eat.  They are bringing in outside food as well.  Granddaughter shares that she brought him pancakes for [employer] and he would not eat  even a single bite.  He only had 2 sips of coffee.  They note that his mind is clear and sound.  We talked about the difficulty in bouncing back from acute illness especially with advanced age.  We talked about the poor prognosis that poor appetite portends, especially given sound mind.  It appears that his body may be in the process of shutting down and poor appetite is consistent with this.  Patient's son shares that they talk to the hospitalist over the past several days and yesterday they were told to maybe give it 2-3 more days to see how he does.  I shared that this is a reasonable expectation to allow another 24 to 48 hours to see how he does.  However, I shared that if his oral intake does not substantially improve this is likely indicative of approaching end-of-life.  The patient's son states he understands this and accepts medical opinions and expertise.  We shared that if his father is approaching end-of-life we do have options on how to proceed.  We talked about possible comfort care.  I explained comfort care as care where the patient would no longer receive aggressive medical interventions such as continuous vital signs, lab work, radiology testing, or medications not focused on comfort, peace, and dignity. This includes stopping antibiotics and weaning oxygen to room air, as these are generally not accepted as providing comfort but only prolonging the dying process artificially. All care would focus on how the patient is looking and feeling. This would include management of any symptoms that may cause discomfort, pain, shortness of breath/air hunger, increased work of breathing, cough, nausea, agitation/restlessness, anxiety, and/or secretions etc. Symptoms would be managed with medications and other non-pharmacological interventions such as spiritual support if requested, repositioning, music therapy, or therapeutic listening. Family verbalized understanding and agreement.  The patient's son  shares that if it is determined that his father is approaching end-of-life and he would want him to be kept comfortable consistent with comfort care as explained.  We also discussed the possibility of hospice care, which is comfort care outside of the hospital. I described hospice as a service for patients who have a life expectancy of 6 months or less. The goal of hospice is the preservation of dignity and quality at the end phases of life. Under hospice care, the focus changes from curative to symptom relief. I explained the three setting where hospice services can be provided including the home, at a living facility (such as LTC SNF, Assisted Living, etc), and a hospice facility. I explained that acceptance to hospice in any specific location is the final decision of the hospice medical director and bed availability, if applicable. They verbalized understanding.  Son agrees that if the decision is made for comfort care that he would  be open to discussions on possibilities of hospice.  I shared that I would debrief with the medical team and nursing teams and encouraged them to continue conversations with them over the weekend.  I shared that palliative medicine would be back on Tuesday and could follow-up if the patient remains in the hospital.    I provided contact card with our information for any questions or concerns while the patient remains in the hospital. I provided emotional and general support through therapeutic listening, empathy, sharing of stories, and other techniques. I answered all questions and addressed all concerns to the best of my ability.  Goals: DNR-limited (DNR/DNI), continue to encourage oral intake, another 24 to 48 hours for progress, ongoing GOC conversations considering quality of life and possibility of comfort care/hospice care pending how he does in the coming days.  Review of Systems  Unable to perform ROS   Objective:   Primary Diagnoses: Present on Admission:   Hypernatremia  Influenza A  Acute hypoxic respiratory failure (HCC)  Atrial fibrillation with RVR (HCC)   Vital Signs:  BP 119/82 (BP Location: Left Arm)   Pulse 78   Temp (!) 97.3 F (36.3 C) (Axillary)   Resp 16   Ht 5' 10 (1.778 m)   Wt 46.4 kg   SpO2 100%   BMI 14.68 kg/m   Physical Exam Vitals and nursing note reviewed.  Constitutional:      General: He is sleeping. He is not in acute distress.    Appearance: He is cachectic. He is ill-appearing. He is not toxic-appearing.  HENT:     Head: Normocephalic and atraumatic.  Cardiovascular:     Rate and Rhythm: Normal rate.  Pulmonary:     Effort: Pulmonary effort is normal. No respiratory distress.  Abdominal:     General: Abdomen is flat. There is no distension.     Palpations: Abdomen is soft.  Skin:    General: Skin is warm and dry.     Palliative Assessment/Data: 20%   Assessment & Plan:   HPI/Patient Profile: 89 y.o. male  with past medical history of CKD 3, hypertension, gout. Patient was brought to the ED via EMS with reports of 1 week of progressive generalized weakness, sore throat, cough, nausea and vomiting and lower abdominal pain.  He was admitted on 05/11/2024 with influenza A with presumed superimposed pneumonia, acute hypoxic respiratory failure, new onset A-fib with RVR at time of admission, CKD 3B, severe protein calorie malnutrition (BMI 14.68), weakness/deconditioning/anorexia, and others.   Palliative medicine was consulted for GOC conversations.  SUMMARY OF RECOMMENDATIONS   DNR-Limited (DNR/DNI) Continue strong encouragement of oral intake Continue IV fluids as a temporary measure Allow another 24 to 48 hours for progress Continue GOC conversations over the weekend with hospital team If no improvement, family open to consideration of comfort care and hospice care Palliative medicine will follow-up 05/21/2019 2525 concerns  Symptom Management:  Per primary team Palliative medicine is  available to assist as needed  Code Status: DNR - Limited (DNR/DNI)  Prognosis:  < 6 weeks  Discharge Planning:  To Be Determined   Discussed with: Patient's  family, medical team, nursing team    Thank you for allowing us  to participate in the care of PER BEAGLEY PMT will continue to support holistically.  Time Total: 80 min  Detailed review of medical records (labs, imaging, vital signs), medically appropriate exam, discussed with treatment team, counseling and education to patient, family, & staff, documenting clinical information,  medication management, coordination of care  Signed by: Camellia Kays, NP Palliative Medicine Team  Team Phone # [phone] (Nights/Weekends)  05/16/2024, 1:13 PM  "

## 2024-05-17 DIAGNOSIS — E870 Hyperosmolality and hypernatremia: Secondary | ICD-10-CM | POA: Diagnosis not present

## 2024-05-17 LAB — GLUCOSE, CAPILLARY
Glucose-Capillary: 133 mg/dL — ABNORMAL HIGH (ref 70–99)
Glucose-Capillary: 144 mg/dL — ABNORMAL HIGH (ref 70–99)
Glucose-Capillary: 56 mg/dL — ABNORMAL LOW (ref 70–99)
Glucose-Capillary: 60 mg/dL — ABNORMAL LOW (ref 70–99)
Glucose-Capillary: 81 mg/dL (ref 70–99)
Glucose-Capillary: 86 mg/dL (ref 70–99)
Glucose-Capillary: 89 mg/dL (ref 70–99)
Glucose-Capillary: 95 mg/dL (ref 70–99)

## 2024-05-17 MED ORDER — DEXTROSE 50 % IV SOLN: 25.0000 mL | Freq: Once | INTRAVENOUS | Status: DC

## 2024-05-17 MED ORDER — MAGIC MOUTHWASH W/LIDOCAINE
5.0000 mL | Freq: Four times a day (QID) | ORAL | Status: DC | PRN
  Administered 2024-05-18: 5 mL via ORAL
  Filled 2024-05-17 (×3): qty 5

## 2024-05-17 MED ORDER — DEXTROSE 50 % IV SOLN
12.5000 g | INTRAVENOUS | Status: AC
  Administered 2024-05-17: 12.5 g via INTRAVENOUS
  Filled 2024-05-17: qty 50

## 2024-05-17 MED ORDER — DEXTROSE 50 % IV SOLN
INTRAVENOUS | Status: AC
  Administered 2024-05-17: 12.5 g
  Filled 2024-05-17: qty 50

## 2024-05-17 NOTE — Progress Notes (Signed)
 " [name]  FMW:[id number] DOB: 03-30-1922 DOA: 05/11/2024 PCP: [name], MD   Brief Narrative:    Hector Lowe is a 89 y.o. male with medical history significant for CKD 3, hypertension, gout. Patient was brought to the ED via EMS with reports of 1 week of progressive generalized weakness, sore throat, cough, nausea and vomiting and lower abdominal pain.  Over the past week he has barely had any oral intake, he has had vomiting with every oral intake, and has not been able to stay hydrated.  Patient was admitted with acute hypoxemic respiratory failure secondary to influenza A infection with likely superimposed pneumonia.  He also has new onset atrial fibrillation with RVR.  He currently continues to have significant amounts of weakness and poor oral intake with hypernatremia.  PT/OT evaluation recommending SNF on discharge, but he continues to have poor oral intake and palliative consulted.  Family members understand that he may require hospice should his intake not improve.  Assessment & Plan:   Principal Problem:   Hypernatremia Active Problems:   Influenza A   Acute hypoxic respiratory failure (HCC)   Atrial fibrillation with RVR (HCC)  Assessment and Plan:   1-influenza A infection with presumed superimposed pneumonia - Treatment with Tamiflu  completed -Completed 5 days of appropriate antibiotics for pneumonia - Continue mucolytics. - Continue oxygen supplementation and wean off as tolerated. - Continue supportive care - Continue to follow clinical response. - Oral intake remains poor   2-acute hypoxic respiratory failure - Secondary to problem #1 -Currently requiring 2 L of oxygen via nasal cannula - Try to wean off O2 as able   3-new onset atrial fibrillation with RVR at time of admission - Excellent response to initial treatment using IV Lopressor  - Continue oral metoprolol  - 2D echo demonstrating preserved ejection fraction and no  significant valvular disorder. - Continue aspirin  for secondary prevention - Follow electrolytes and maintain magnesium above 2 and potassium above # 4.  4-chronic kidney disease stage IIIb - Appears to be close to his baseline,  Renally adjust medications, avoid nephrotoxic agents / dehydration  / hypotension   5-hypertension - Stable for the most part - Patient has not been taking medication for about a week prior to admission - Continue the use of metoprolol  for now - Continue holding Norvasc  and Imdur  for now; in order to prevent hypotension. - Follow vital signs.   6-severe protein calorie malnutrition -Body mass index is 14.68 kg/m.  - Continue supportive care. -Speech pathology consult appreciated - Feeding supplements twice a day between meals have been started. - Concern for failure to thrive noted and appreciate palliative consultation -=-Oral intake remains poor/challenging resulting in occasional episodes of hypoglycemia requiring IV dextrose  infusion No Nausea, Vomiting or Diarrhea  7-hypernatremia - Resolved, continue now on LR due to poor oral intake and risk for dehydration  8-weakness/deconditioning/anorexia - Plan for potential SNF if starting to eat more, otherwise may transition to comfort care/hospice  9) social/ethics--- palliative care consult appreciated - Anticipate transition to hospice/comfort care over the next day or 2 if oral intake does not improve     DVT prophylaxis:Heparin  Code Status: DNR Family Communication: Son and granddaughter are primary contact disposition Plan: Possible transition to hospice/comfort care if unable to tolerate oral intake Status is: Inpatient  Consultants:  Palliative care  Antimicrobials:  Anti-infectives (From admission, onward)    Start     Dose/Rate Route Frequency Ordered Stop  05/15/24 1000  cefTRIAXone  (ROCEPHIN ) 2 g in sodium chloride  0.9 % 100 mL IVPB        2 g 200 mL/hr over 30 Minutes  Intravenous Every 24 hours 05/15/24 0848 05/15/24 0958   05/12/24 1500  cefTRIAXone  (ROCEPHIN ) 2 g in sodium chloride  0.9 % 100 mL IVPB  Status:  Discontinued        2 g 200 mL/hr over 30 Minutes Intravenous Every 24 hours 05/11/24 1700 05/15/24 0848   05/11/24 1615  oseltamivir  (TAMIFLU ) capsule 75 mg  Status:  Discontinued       Note to Pharmacy: Pls adjust dose as indicated, thanks.   75 mg Oral Daily 05/11/24 1600 05/11/24 1611   05/11/24 1615  oseltamivir  (TAMIFLU ) capsule 30 mg       Note to Pharmacy: Pls adjust dose as indicated, thanks.   30 mg Oral Daily 05/11/24 1611 05/15/24 0900   05/11/24 1530  cefTRIAXone  (ROCEPHIN ) 1 g in sodium chloride  0.9 % 100 mL IVPB        1 g 200 mL/hr over 30 Minutes Intravenous  Once 05/11/24 1519 05/11/24 1604   05/11/24 1530  azithromycin  (ZITHROMAX ) 500 mg in sodium chloride  0.9 % 250 mL IVPB        500 mg 250 mL/hr over 60 Minutes Intravenous Every 24 hours 05/11/24 1519 05/15/24 1616       Subjective: -  =-Oral intake remains poor/challenging resulting in occasional episodes of hypoglycemia requiring IV dextrose  infusion No Nausea, Vomiting or Diarrhea -- Anticipate transition to hospice/comfort care over the next day or 2 if oral intake does not improve  Objective: Vitals:   05/16/24 1438 05/16/24 1953 05/17/24 0410 05/17/24 1225  BP: 112/84 (!) 133/99 124/80 113/78  Pulse: (!) 55 96 82 81  Resp:  16 18   Temp: 98.6 F (37 C) 97.8 F (36.6 C) 97.8 F (36.6 C) 97.6 F (36.4 C)  TempSrc: Oral  Oral Oral  SpO2: 94% 99% 98% 96%  Weight:      Height:        Intake/Output Summary (Last 24 hours) at 05/17/2024 1740 Last data filed at 05/17/2024 1329 Gross per 24 hour  Intake 80 ml  Output --  Net 80 ml   Filed Weights   05/11/24 1710  Weight: 46.4 kg    Physical Exam  Gen:-Awake Alert, in no acute distress,frail, emaciated/cachectic appearing HEENT:- [place].AT, No sclera icterus Nose- [place] 2L/min Neck-Supple Neck,No JVD,.   Lungs-  CTAB , fair air movement bilaterally  CV- S1, S2 normal, RRR Abd-  +ve B.Sounds, Abd Soft, No tenderness,    Extremity/Skin:- No  edema,   good pedal pulses  Psych-affect is flat,, cognitive and memory deficits, Neuro-generalized weakness, no new focal deficits, no tremors  Data Reviewed: I have personally reviewed following labs and imaging studies  CBC: Recent Labs  Lab 05/11/24 1236 05/13/24 0451 05/14/24 0744 05/15/24 0451 05/16/24 0441  WBC 9.0 8.1 11.3* 8.3 8.1  NEUTROABS 6.9  --   --   --   --   HGB 15.6 12.3* 13.2 11.9* 12.0*  HCT 52.3* 40.4 43.7 39.2 39.8  MCV 101.2* 100.0 101.2* 100.8* 100.5*  PLT 161 128* 156 139* 151   Basic Metabolic Panel: Recent Labs  Lab 05/11/24 1349 05/11/24 1434 05/13/24 0451 05/14/24 0744 05/15/24 0451 05/16/24 0441  NA 155*  --  149* 148* 143 143  K 4.6  --  4.5 4.9 4.2 4.1  CL 117*  --  117*  115* 110 110  CO2 18*  --  16* 18* 22 22  GLUCOSE 79  --  65* 92 126* 74  BUN 37*  --  47* 55* 50* 41*  CREATININE 1.88*  --  1.87* 1.95* 2.04* 1.66*  CALCIUM  9.4  --  8.3* 8.8* 8.3* 8.6*  MG  --  2.3  --  2.4 2.2 2.3   GFR: Estimated Creatinine Clearance: 15.5 mL/min (A) (by C-G formula based on SCr of 1.66 mg/dL (H)). Liver Function Tests: Recent Labs  Lab 05/11/24 1349  AST 36  ALT 18  ALKPHOS 162*  BILITOT 1.2  PROT 7.5  ALBUMIN 3.6   Recent Labs  Lab 05/11/24 1349  LIPASE 37   CBG: Recent Labs  Lab 05/17/24 0411 05/17/24 0717 05/17/24 1134 05/17/24 1159 05/17/24 1623  GLUCAP 95 81 56* 144* 89   Sepsis Labs: Recent Labs  Lab 05/11/24 1434 05/11/24 1622 05/14/24 0744 05/15/24 0451 05/16/24 0441  PROCALCITON 0.25  --   --   --   --   LATICACIDVEN  --  3.1* 2.5* 2.3* 2.3*    Recent Results (from the past 240 hours)  Resp panel by RT-PCR (RSV, Flu A&B, Covid) Anterior Nasal Swab     Status: Abnormal   Collection Time: 05/11/24 12:36 PM   Specimen: Anterior Nasal Swab  Result Value Ref Range  Status   SARS Coronavirus 2 by RT PCR NEGATIVE NEGATIVE Final    Comment: (NOTE) SARS-CoV-2 target nucleic acids are NOT DETECTED.  The SARS-CoV-2 RNA is generally detectable in upper respiratory specimens during the acute phase of infection. The lowest concentration of SARS-CoV-2 viral copies this assay can detect is 138 copies/mL. A negative result does not preclude SARS-Cov-2 infection and should not be used as the sole basis for treatment or other patient management decisions. A negative result may occur with  improper specimen collection/handling, submission of specimen other than nasopharyngeal swab, presence of viral mutation(s) within the areas targeted by this assay, and inadequate number of viral copies(<138 copies/mL). A negative result must be combined with clinical observations, patient history, and epidemiological information. The expected result is Negative.  Fact Sheet for Patients:  bloggercourse.com  Fact Sheet for Healthcare Providers:  seriousbroker.it  This test is no t yet approved or cleared by the [place]  FDA and  has been authorized for detection and/or diagnosis of SARS-CoV-2 by FDA under an Emergency Use Authorization (EUA). This EUA will remain  in effect (meaning this test can be used) for the duration of the COVID-19 declaration under Section 564(b)(1) of the Act, 21 U.S.C.section 360bbb-3(b)(1), unless the authorization is terminated  or revoked sooner.       Influenza A by PCR POSITIVE (A) NEGATIVE Final   Influenza B by PCR NEGATIVE NEGATIVE Final    Comment: (NOTE) The Xpert Xpress SARS-CoV-2/FLU/RSV plus assay is intended as an aid in the diagnosis of influenza from Nasopharyngeal swab specimens and should not be used as a sole basis for treatment. Nasal washings and aspirates are unacceptable for Xpert Xpress SARS-CoV-2/FLU/RSV testing.  Fact Sheet for  Patients: bloggercourse.com  Fact Sheet for Healthcare Providers: seriousbroker.it  This test is not yet approved or cleared by the [place]  FDA and has been authorized for detection and/or diagnosis of SARS-CoV-2 by FDA under an Emergency Use Authorization (EUA). This EUA will remain in effect (meaning this test can be used) for the duration of the COVID-19 declaration under Section 564(b)(1) of the Act, 21 U.S.C. section 360bbb-3(b)(1),  unless the authorization is terminated or revoked.     Resp Syncytial Virus by PCR NEGATIVE NEGATIVE Final    Comment: (NOTE) Fact Sheet for Patients: bloggercourse.com  Fact Sheet for Healthcare Providers: seriousbroker.it  This test is not yet approved or cleared by the [place]  FDA and has been authorized for detection and/or diagnosis of SARS-CoV-2 by FDA under an Emergency Use Authorization (EUA). This EUA will remain in effect (meaning this test can be used) for the duration of the COVID-19 declaration under Section 564(b)(1) of the Act, 21 U.S.C. section 360bbb-3(b)(1), unless the authorization is terminated or revoked.  Performed at [facility], [address] [place], [place] [address]   Culture, blood (routine x 2)     Status: None   Collection Time: 05/11/24  4:22 PM   Specimen: BLOOD LEFT FOREARM  Result Value Ref Range Status   Specimen Description BLOOD LEFT FOREARM AEROBIC BOTTLE ONLY  Final   Special Requests   Final    Blood Culture results may not be optimal due to an inadequate volume of blood received in culture bottles   Culture   Final    NO GROWTH 5 DAYS Performed at [facility], [address], [place], [place] [address]    Report Status 05/16/2024 FINAL  Final  Culture, blood (routine x 2)     Status: None   Collection Time: 05/11/24  4:22 PM   Specimen: Left Antecubital; Blood  Result Value Ref Range  Status   Specimen Description   Final    LEFT ANTECUBITAL BOTTLES DRAWN AEROBIC AND ANAEROBIC   Special Requests   Final    Blood Culture results may not be optimal due to an inadequate volume of blood received in culture bottles   Culture   Final    NO GROWTH 5 DAYS Performed at [facility], [address], [place], [place] [address]    Report Status 05/16/2024 FINAL  Final    Scheduled Meds:  aspirin   81 mg Oral Daily   dextrose   25 mL Intravenous Once   feeding supplement  237 mL Oral BID BM   heparin   5,000 Units Subcutaneous Q8H   metoprolol  tartrate  25 mg Oral BID   mouth rinse  15 mL Mouth Rinse 4 times per day   Continuous Infusions:  lactated ringers  Stopped (05/15/24 1510)     LOS: 6 days   Taydem Cavagnaro,MD [facility]  If 7PM-7AM, please contact night-coverage [email or web address] 05/17/2024, 5:40 PM   "

## 2024-05-17 NOTE — Plan of Care (Signed)
°  Problem: Education: °Goal: Knowledge of General Education information will improve °Description: Including pain rating scale, medication(s)/side effects and non-pharmacologic comfort measures °Outcome: Progressing °  °Problem: Clinical Measurements: °Goal: Diagnostic test results will improve °Outcome: Progressing °  °Problem: Clinical Measurements: °Goal: Respiratory complications will improve °Outcome: Progressing °  °Problem: Activity: °Goal: Risk for activity intolerance will decrease °Outcome: Progressing °  °

## 2024-05-17 NOTE — Progress Notes (Signed)
" °  Interdisciplinary Goals of Care Family Meeting   Date carried out: 05/17/2024  Location of the meeting: Bedside  Member's involved: Physician and Family Member or next of kin  Durable Power of Attorney or acting medical decision maker:   Conference with patient's son Mr. Hector Lowe-- Oral intake remains poor/challenging resulting in occasional episodes of hypoglycemia requiring IV dextrose  infusion No Nausea, Vomiting or Diarrhea - Patient's son would like to talk to hospice again on Monday, 05/19/2024 with plans of transitioning to full comfort care/hospice protocols and possible transfer to residential hospice if patient continues to be unable to eat enough to sustain himself  Discussion: We discussed goals of care for [name] .    Code status:   Code Status: Limited: Do not attempt resuscitation (DNR) -DNR-LIMITED -Do Not Intubate/DNI    Disposition: Continue current acute care  Time spent for the meeting: 36 mins  Rendall Carwin, MD  05/17/2024, 6:15 PM   "

## 2024-05-17 NOTE — Progress Notes (Signed)
 Notified by NT that patient's CBG was 60. NP B. Jesus notified. Hypoglycemic protocol activated. Orange juice given, patient unable to tolerate. Per patient it was burning his tongue. 1/2 amp d50 given per protocol. CBG rechecked, 133 now.

## 2024-05-18 DIAGNOSIS — E870 Hyperosmolality and hypernatremia: Secondary | ICD-10-CM | POA: Diagnosis not present

## 2024-05-18 LAB — GLUCOSE, CAPILLARY
Glucose-Capillary: 64 mg/dL — ABNORMAL LOW (ref 70–99)
Glucose-Capillary: 78 mg/dL (ref 70–99)
Glucose-Capillary: 83 mg/dL (ref 70–99)
Glucose-Capillary: 88 mg/dL (ref 70–99)
Glucose-Capillary: 90 mg/dL (ref 70–99)

## 2024-05-18 MED ORDER — DIPHENHYDRAMINE HCL 50 MG/ML IJ SOLN
12.5000 mg | INTRAMUSCULAR | Status: DC | PRN
  Administered 2024-06-03: 12.5 mg via INTRAVENOUS
  Filled 2024-05-18: qty 1

## 2024-05-18 MED ORDER — LORAZEPAM 1 MG PO TABS
1.0000 mg | ORAL_TABLET | ORAL | Status: DC | PRN
  Administered 2024-06-02: 1 mg via ORAL
  Filled 2024-05-18: qty 1

## 2024-05-18 MED ORDER — MORPHINE SULFATE (PF) 2 MG/ML IV SOLN
1.0000 mg | INTRAVENOUS | Status: DC | PRN
  Administered 2024-05-30: 4 mg via INTRAVENOUS
  Filled 2024-05-18: qty 2

## 2024-05-18 MED ORDER — BIOTENE DRY MOUTH MT LIQD: 15.0000 mL | OROMUCOSAL | Status: DC | PRN

## 2024-05-18 MED ORDER — MORPHINE SULFATE (CONCENTRATE) 10 MG /0.5 ML PO SOLN
5.0000 mg | ORAL | Status: DC | PRN
  Administered 2024-06-01: 5 mg via SUBLINGUAL
  Filled 2024-05-18: qty 0.5

## 2024-05-18 MED ORDER — LORAZEPAM 2 MG/ML PO CONC: 1.0000 mg | ORAL | Status: DC | PRN

## 2024-05-18 MED ORDER — ONDANSETRON HCL 4 MG/2ML IJ SOLN
4.0000 mg | Freq: Four times a day (QID) | INTRAMUSCULAR | Status: DC | PRN
  Administered 2024-05-27 – 2024-05-29 (×2): 4 mg via INTRAVENOUS
  Filled 2024-05-18 (×2): qty 2

## 2024-05-18 MED ORDER — GLYCOPYRROLATE 0.2 MG/ML IJ SOLN: 0.2000 mg | INTRAMUSCULAR | Status: DC | PRN

## 2024-05-18 MED ORDER — POLYVINYL ALCOHOL 1.4 % OP SOLN: 1.0000 [drp] | Freq: Four times a day (QID) | OPHTHALMIC | Status: DC | PRN

## 2024-05-18 MED ORDER — LORAZEPAM 2 MG/ML IJ SOLN
1.0000 mg | INTRAMUSCULAR | Status: DC | PRN
  Filled 2024-05-18: qty 1

## 2024-05-18 MED ORDER — ALBUTEROL SULFATE (2.5 MG/3ML) 0.083% IN NEBU: 2.5000 mg | INHALATION_SOLUTION | RESPIRATORY_TRACT | Status: DC | PRN

## 2024-05-18 MED ORDER — ACETAMINOPHEN 325 MG PO TABS: 650.0000 mg | ORAL_TABLET | Freq: Four times a day (QID) | ORAL | Status: DC | PRN

## 2024-05-18 MED ORDER — ACETAMINOPHEN 650 MG RE SUPP: 650.0000 mg | Freq: Four times a day (QID) | RECTAL | Status: DC | PRN

## 2024-05-18 MED ORDER — MORPHINE SULFATE (CONCENTRATE) 10 MG /0.5 ML PO SOLN: 5.0000 mg | ORAL | Status: DC | PRN

## 2024-05-18 MED ORDER — ONDANSETRON 4 MG PO TBDP: 4.0000 mg | ORAL_TABLET | Freq: Four times a day (QID) | ORAL | Status: DC | PRN

## 2024-05-18 MED ORDER — GLYCOPYRROLATE 1 MG PO TABS: 1.0000 mg | ORAL_TABLET | ORAL | Status: DC | PRN

## 2024-05-18 NOTE — Progress Notes (Signed)
 Dr. Rendall talked to patient's son and granddaughter concerning making patient comfort care. Patient's family agreed that they would like to make patient comfort care and expressed knowledge of what it would mean in terms of patient care.

## 2024-05-18 NOTE — TOC Progression Note (Signed)
 Transition of Care [facility]) - Progression Note    Patient Details  Name: Hector Lowe MRN: [id number] Date of Birth: 10-Jan-1922  [facility] CM/SW Contact  Lucie Lunger, [place] Phone Number: 05/18/2024, 11:01 AM  Clinical Narrative:    CSW spoke with pts son who states they prefer hospice with [facility]. At this time referral has been sent for review. CSW to follow up with [facility] tomorrow to see if RN can assess for [place] house placement vs GIP here at hospital. TOC to follow.   Expected Discharge Plan: Skilled Nursing Facility Barriers to Discharge: Continued Medical Work up               Expected Discharge Plan and Services In-house Referral: Clinical Social Work Discharge Planning Services: CM Consult Post Acute Care Choice: Durable Medical Equipment Living arrangements for the past 2 months: Single Family Home                 DME Arranged: Oxygen DME Agency: [employer] Date DME Agency Contacted: 05/13/24 Time DME Agency Contacted: 1525 Representative spoke with at DME Agency: Darlyn             Social Drivers of Health (SDOH) Interventions SDOH Screenings   Food Insecurity: Patient Unable To Answer (05/11/2024)  Housing: Unknown (05/11/2024)  Transportation Needs: Patient Unable To Answer (05/11/2024)  Utilities: Patient Unable To Answer (05/11/2024)  Social Connections: Patient Unable To Answer (05/11/2024)  Tobacco Use: High Risk (05/11/2024)    Readmission Risk Interventions    05/14/2024   12:50 PM  Readmission Risk Prevention Plan  Transportation Screening Complete  Home Care Screening Complete  Medication Review (RN CM) Complete

## 2024-05-18 NOTE — Progress Notes (Signed)
 " [name]  FMW:[id number] DOB: 03/03/1922 DOA: 05/11/2024 PCP: [name], MD   Brief Narrative:    Hector Lowe is a 89 y.o. male with medical history significant for CKD 3, hypertension, gout. Patient was brought to the ED via EMS with reports of 1 week of progressive generalized weakness, sore throat, cough, nausea and vomiting and lower abdominal pain.  Over the past week he has barely had any oral intake, he has had vomiting with every oral intake, and has not been able to stay hydrated.  Patient was admitted with acute hypoxemic respiratory failure secondary to influenza A infection with likely superimposed pneumonia.  He also has new onset atrial fibrillation with RVR.  He currently continues to have significant amounts of weakness and poor oral intake with hypernatremia.  PT/OT evaluation recommending SNF on discharge, but he continues to have poor oral intake and palliative consulted.  Family members understand that he may require hospice should his intake not improve.  05/18/24 Conference with patient's son Mr. Hector Malcomb, RN Ronal Rather Oral intake remains poor/challenging resulting in occasional episodes of hypoglycemia requiring IV dextrose  infusion No Nausea, Vomiting or Diarrhea - Patient's son and grand daughter asked the RN to call me to discuss transitioning to comfort care/hospice today 05/18/2024, rather than  waiting another day  -- They are requesting transition to full comfort care/hospice protocols and possible transfer to residential hospice if patient continues to be unable to eat enough to sustain himself -- Social worker notified of family's decision to transition to comfort care as of 05/18/2024 and need to notify hospice team  Assessment & Plan:   Principal Problem:   Hypernatremia Active Problems:   Influenza A   Acute hypoxic respiratory failure (HCC)   Atrial fibrillation with RVR (HCC)  Assessment and  Plan:   1-influenza A infection with presumed superimposed pneumonia - Treatment with Tamiflu  completed -Completed 5 days of appropriate antibiotics for pneumonia - -- Oral intake remains poor with recurrent episodes of hypoglycemia requiring IV dextrose  couple times a day --At family request transition patient to full comfort care on 05/18/2024   2-acute hypoxic respiratory failure - Secondary to problem #1 - Comfort care as  below in #9   3-new onset atrial fibrillation with RVR at time of admission - Excellent response to initial treatment using IV Lopressor  - - 2D echo demonstrating preserved ejection fraction and no significant valvular disorder. -- Comfort care as  below in #9  4-chronic kidney disease stage IIIb - Appears to be close to his baseline,  -- Comfort care as  below in #9   5-hypertension - - Comfort care as  below in #9   6-severe protein calorie malnutrition -Body mass index is 14.68 kg/m.  - Continue supportive care. -Speech pathology consult appreciated - Feeding supplements twice a day between meals have been started. - Concern for failure to thrive noted and appreciate palliative consultation -=-Oral intake remains poor/challenging resulting in occasional episodes of hypoglycemia requiring IV dextrose  infusion No Nausea, Vomiting or Diarrhea -- Comfort care as  below in #9  7-hypernatremia - Resolved, continue now on LR due to poor oral intake and risk for dehydration  8-weakness/deconditioning/anorexia -- Comfort care as  below in #9  9) social/ethics--- palliative care consult appreciated --05/18/24 Conference with patient's son Mr. Hector Clausen, RN Ronal Rather Oral intake remains poor/challenging resulting in occasional episodes of hypoglycemia requiring IV dextrose  infusion No Nausea, Vomiting or Diarrhea -  Patient's son and grand daughter asked the RN to call me to discuss transitioning to comfort care/hospice today 05/18/2024, rather  than  waiting another day  -- They are requesting transition to full comfort care/hospice protocols and possible transfer to residential hospice if patient continues to be unable to eat enough to sustain himself -- Social worker notified of family's decision to transition to comfort care as of 05/18/2024 and need to notify hospice team     DVT prophylaxis:Heparin  Code Status: DNR Family Communication: Son and granddaughter are primary contact disposition Plan: - Comfort care as above in #9 Status is: Inpatient  Consultants:  Palliative care  Antimicrobials:  Anti-infectives (From admission, onward)    Start     Dose/Rate Route Frequency Ordered Stop   05/15/24 1000  cefTRIAXone  (ROCEPHIN ) 2 g in sodium chloride  0.9 % 100 mL IVPB        2 g 200 mL/hr over 30 Minutes Intravenous Every 24 hours 05/15/24 0848 05/15/24 0958   05/12/24 1500  cefTRIAXone  (ROCEPHIN ) 2 g in sodium chloride  0.9 % 100 mL IVPB  Status:  Discontinued        2 g 200 mL/hr over 30 Minutes Intravenous Every 24 hours 05/11/24 1700 05/15/24 0848   05/11/24 1615  oseltamivir  (TAMIFLU ) capsule 75 mg  Status:  Discontinued       Note to Pharmacy: Pls adjust dose as indicated, thanks.   75 mg Oral Daily 05/11/24 1600 05/11/24 1611   05/11/24 1615  oseltamivir  (TAMIFLU ) capsule 30 mg       Note to Pharmacy: Pls adjust dose as indicated, thanks.   30 mg Oral Daily 05/11/24 1611 05/15/24 0900   05/11/24 1530  cefTRIAXone  (ROCEPHIN ) 1 g in sodium chloride  0.9 % 100 mL IVPB        1 g 200 mL/hr over 30 Minutes Intravenous  Once 05/11/24 1519 05/11/24 1604   05/11/24 1530  azithromycin  (ZITHROMAX ) 500 mg in sodium chloride  0.9 % 250 mL IVPB        500 mg 250 mL/hr over 60 Minutes Intravenous Every 24 hours 05/11/24 1519 05/15/24 1616       Subjective: -  =-Oral intake remains poor/challenging resulting in occasional episodes of hypoglycemia requiring IV dextrose  infusion No Nausea, Vomiting or Diarrhea -Patient's son  and granddaughter at bedside requesting transition to full comfort care Comfort care as above in #9  Objective: Vitals:   05/17/24 0410 05/17/24 1225 05/17/24 1930 05/18/24 0420  BP: 124/80 113/78 (!) 131/93 116/78  Pulse: 82 81 91 83  Resp: 18  18 18   Temp: 97.8 F (36.6 C) 97.6 F (36.4 C) 97.6 F (36.4 C)   TempSrc: Oral Oral Oral   SpO2: 98% 96% 98% 94%  Weight:      Height:        Intake/Output Summary (Last 24 hours) at 05/18/2024 1304 Last data filed at 05/17/2024 1849 Gross per 24 hour  Intake 100 ml  Output --  Net 100 ml   Filed Weights   05/11/24 1710  Weight: 46.4 kg    Physical Exam  Gen:-Awake Alert, in no acute distress,frail, emaciated/cachectic appearing HEENT:- [place].AT, No sclera icterus Neck-Supple Neck,No JVD,.  Lungs-  CTAB , fair air movement bilaterally  CV- S1, S2 normal, RRR Abd-  +ve B.Sounds, Abd Soft, No tenderness,    Extremity/Skin:- No  edema,   good pedal pulses  Psych-affect is flat,, cognitive and memory deficits, Neuro-generalized weakness, no new focal deficits, no tremors  Data  Reviewed: I have personally reviewed following labs and imaging studies  CBC: Recent Labs  Lab 05/13/24 0451 05/14/24 0744 05/15/24 0451 05/16/24 0441  WBC 8.1 11.3* 8.3 8.1  HGB 12.3* 13.2 11.9* 12.0*  HCT 40.4 43.7 39.2 39.8  MCV 100.0 101.2* 100.8* 100.5*  PLT 128* 156 139* 151   Basic Metabolic Panel: Recent Labs  Lab 05/11/24 1349 05/11/24 1434 05/13/24 0451 05/14/24 0744 05/15/24 0451 05/16/24 0441  NA 155*  --  149* 148* 143 143  K 4.6  --  4.5 4.9 4.2 4.1  CL 117*  --  117* 115* 110 110  CO2 18*  --  16* 18* 22 22  GLUCOSE 79  --  65* 92 126* 74  BUN 37*  --  47* 55* 50* 41*  CREATININE 1.88*  --  1.87* 1.95* 2.04* 1.66*  CALCIUM  9.4  --  8.3* 8.8* 8.3* 8.6*  MG  --  2.3  --  2.4 2.2 2.3   GFR: Estimated Creatinine Clearance: 15.5 mL/min (A) (by C-G formula based on SCr of 1.66 mg/dL (H)). Liver Function Tests: Recent  Labs  Lab 05/11/24 1349  AST 36  ALT 18  ALKPHOS 162*  BILITOT 1.2  PROT 7.5  ALBUMIN 3.6   Recent Labs  Lab 05/11/24 1349  LIPASE 37   CBG: Recent Labs  Lab 05/17/24 1934 05/18/24 0006 05/18/24 0421 05/18/24 0741 05/18/24 0831  GLUCAP 86 83 78 64* 88   Sepsis Labs: Recent Labs  Lab 05/11/24 1434 05/11/24 1622 05/14/24 0744 05/15/24 0451 05/16/24 0441  PROCALCITON 0.25  --   --   --   --   LATICACIDVEN  --  3.1* 2.5* 2.3* 2.3*    Recent Results (from the past 240 hours)  Resp panel by RT-PCR (RSV, Flu A&B, Covid) Anterior Nasal Swab     Status: Abnormal   Collection Time: 05/11/24 12:36 PM   Specimen: Anterior Nasal Swab  Result Value Ref Range Status   SARS Coronavirus 2 by RT PCR NEGATIVE NEGATIVE Final    Comment: (NOTE) SARS-CoV-2 target nucleic acids are NOT DETECTED.  The SARS-CoV-2 RNA is generally detectable in upper respiratory specimens during the acute phase of infection. The lowest concentration of SARS-CoV-2 viral copies this assay can detect is 138 copies/mL. A negative result does not preclude SARS-Cov-2 infection and should not be used as the sole basis for treatment or other patient management decisions. A negative result may occur with  improper specimen collection/handling, submission of specimen other than nasopharyngeal swab, presence of viral mutation(s) within the areas targeted by this assay, and inadequate number of viral copies(<138 copies/mL). A negative result must be combined with clinical observations, patient history, and epidemiological information. The expected result is Negative.  Fact Sheet for Patients:  bloggercourse.com  Fact Sheet for Healthcare Providers:  seriousbroker.it  This test is no t yet approved or cleared by the [place]  FDA and  has been authorized for detection and/or diagnosis of SARS-CoV-2 by FDA under an Emergency Use Authorization (EUA). This  EUA will remain  in effect (meaning this test can be used) for the duration of the COVID-19 declaration under Section 564(b)(1) of the Act, 21 U.S.C.section 360bbb-3(b)(1), unless the authorization is terminated  or revoked sooner.       Influenza A by PCR POSITIVE (A) NEGATIVE Final   Influenza B by PCR NEGATIVE NEGATIVE Final    Comment: (NOTE) The Xpert Xpress SARS-CoV-2/FLU/RSV plus assay is intended as an aid in the diagnosis  of influenza from Nasopharyngeal swab specimens and should not be used as a sole basis for treatment. Nasal washings and aspirates are unacceptable for Xpert Xpress SARS-CoV-2/FLU/RSV testing.  Fact Sheet for Patients: bloggercourse.com  Fact Sheet for Healthcare Providers: seriousbroker.it  This test is not yet approved or cleared by the [place]  FDA and has been authorized for detection and/or diagnosis of SARS-CoV-2 by FDA under an Emergency Use Authorization (EUA). This EUA will remain in effect (meaning this test can be used) for the duration of the COVID-19 declaration under Section 564(b)(1) of the Act, 21 U.S.C. section 360bbb-3(b)(1), unless the authorization is terminated or revoked.     Resp Syncytial Virus by PCR NEGATIVE NEGATIVE Final    Comment: (NOTE) Fact Sheet for Patients: bloggercourse.com  Fact Sheet for Healthcare Providers: seriousbroker.it  This test is not yet approved or cleared by the [place]  FDA and has been authorized for detection and/or diagnosis of SARS-CoV-2 by FDA under an Emergency Use Authorization (EUA). This EUA will remain in effect (meaning this test can be used) for the duration of the COVID-19 declaration under Section 564(b)(1) of the Act, 21 U.S.C. section 360bbb-3(b)(1), unless the authorization is terminated or revoked.  Performed at [facility], [address], [place], [place] [address]    Culture, blood (routine x 2)     Status: None   Collection Time: 05/11/24  4:22 PM   Specimen: BLOOD LEFT FOREARM  Result Value Ref Range Status   Specimen Description BLOOD LEFT FOREARM AEROBIC BOTTLE ONLY  Final   Special Requests   Final    Blood Culture results may not be optimal due to an inadequate volume of blood received in culture bottles   Culture   Final    NO GROWTH 5 DAYS Performed at [facility], [address] [place], [place] [address]    Report Status 05/16/2024 FINAL  Final  Culture, blood (routine x 2)     Status: None   Collection Time: 05/11/24  4:22 PM   Specimen: Left Antecubital; Blood  Result Value Ref Range Status   Specimen Description   Final    LEFT ANTECUBITAL BOTTLES DRAWN AEROBIC AND ANAEROBIC   Special Requests   Final    Blood Culture results may not be optimal due to an inadequate volume of blood received in culture bottles   Culture   Final    NO GROWTH 5 DAYS Performed at [facility], [address], [place], [place] [address]    Report Status 05/16/2024 FINAL  Final    Scheduled Meds:  aspirin   81 mg Oral Daily   feeding supplement  237 mL Oral BID BM   metoprolol  tartrate  25 mg Oral BID   mouth rinse  15 mL Mouth Rinse 4 times per day    LOS: 7 days   Sevon Rotert,MD [facility]  If 7PM-7AM, please contact night-coverage [email or web address] 05/18/2024, 1:04 PM   "

## 2024-05-18 NOTE — Progress Notes (Signed)
" °  Interdisciplinary Goals of Care Family Meeting   Date carried out: 05/18/2024  Location of the meeting: Bedside  Member's involved: Physician, Bedside Registered Nurse, and Family Member or next of kin  Durable Power of Attorney or acting medical decision maker:   Conference with patient's son Mr. Hector Seier, RN [facility] [place] Oral intake remains poor/challenging resulting in occasional episodes of hypoglycemia requiring IV dextrose  infusion No Nausea, Vomiting or Diarrhea - Patient's son and grand daughter asked the RN to call me to discuss transitioning to comfort care/hospice today 05/18/2024, rather than  waiting another day  -- They are requesting transition to full comfort care/hospice protocols and possible transfer to residential hospice if patient continues to be unable to eat enough to sustain himself -- Social worker notified of family's decision to transition to comfort care as of 05/18/2024 and need to notify hospice team  Discussion: We discussed goals of care for [facility] .  Code status:   Code Status: Do not attempt resuscitation (DNR) - Comfort care   Disposition: In-patient comfort care  Time spent for the meeting: 41 mins  Rendall Carwin, MD  05/18/2024, 1:00 PM   "

## 2024-05-18 NOTE — Plan of Care (Signed)
" °  Problem: Education: Goal: Knowledge of General Education information will improve Description: Including pain rating scale, medication(s)/side effects and non-pharmacologic comfort measures Outcome: Progressing   Problem: Health Behavior/Discharge Planning: Goal: Ability to manage health-related needs will improve Outcome: Progressing   Problem: Clinical Measurements: Goal: Ability to maintain clinical measurements within normal limits will improve Outcome: Progressing Goal: Will remain free from infection Outcome: Progressing Goal: Diagnostic test results will improve Outcome: Progressing Goal: Respiratory complications will improve Outcome: Progressing   Problem: Activity: Goal: Risk for activity intolerance will decrease Outcome: Progressing   Problem: Nutrition: Goal: Adequate nutrition will be maintained Outcome: Progressing   Problem: Coping: Goal: Level of anxiety will decrease Outcome: Progressing   Problem: Elimination: Goal: Will not experience complications related to urinary retention Outcome: Progressing   Problem: Safety: Goal: Ability to remain free from injury will improve Outcome: Progressing   Problem: Skin Integrity: Goal: Risk for impaired skin integrity will decrease Outcome: Progressing   Problem: Activity: Goal: Ability to tolerate increased activity will improve Outcome: Progressing   Problem: Clinical Measurements: Goal: Ability to maintain a body temperature in the normal range will improve Outcome: Progressing   Problem: Respiratory: Goal: Ability to maintain adequate ventilation will improve Outcome: Progressing Goal: Ability to maintain a clear airway will improve Outcome: Progressing   Problem: Education: Goal: Knowledge of disease or condition will improve Outcome: Progressing Goal: Understanding of medication regimen will improve Outcome: Progressing Goal: Individualized Educational Video(s) Outcome: Progressing    Problem: Activity: Goal: Ability to tolerate increased activity will improve Outcome: Progressing   Problem: Cardiac: Goal: Ability to achieve and maintain adequate cardiopulmonary perfusion will improve Outcome: Progressing   "

## 2024-05-19 DIAGNOSIS — E870 Hyperosmolality and hypernatremia: Secondary | ICD-10-CM | POA: Diagnosis not present

## 2024-05-19 LAB — GLUCOSE, CAPILLARY: Glucose-Capillary: 108 mg/dL — ABNORMAL HIGH (ref 70–99)

## 2024-05-19 LAB — RESP PANEL BY RT-PCR (RSV, FLU A&B, COVID)  RVPGX2
Influenza A by PCR: POSITIVE — AB
Influenza B by PCR: NEGATIVE
Resp Syncytial Virus by PCR: NEGATIVE
SARS Coronavirus 2 by RT PCR: NEGATIVE

## 2024-05-19 NOTE — TOC Progression Note (Signed)
 Transition of Care [facility]) - Progression Note    Patient Details  Name: Hector Lowe MRN: [id number] Date of Birth: 1922-03-02  [facility] CM/SW Contact  [place] Saddie Kim, [place] Phone Number: 05/19/2024, 2:14 PM  Clinical Narrative:  [facility] assessed pt and is [facility] for [facility]. However, repeat flu test still positive. Per Dorina at [facility], pt will need repeat flu test on Wednesday. If negative they can accept pt. They may still take pt on Wednesday if he remains asymptomatic.  They cannot consider GIP at this time as there is a bed open at [facility]. Pt's son and MD updated.     Expected Discharge Plan: Skilled Nursing Facility Barriers to Discharge: Other (must enter comment) [facility] unable to take pt since flu+ until Wednesday)               Expected Discharge Plan and Services In-house Referral: Clinical Social Work Discharge Planning Services: CM Consult Post Acute Care Choice: Durable Medical Equipment Living arrangements for the past 2 months: Single Family Home Expected Discharge Date: 05/19/24               DME Arranged: Oxygen DME Agency: [employer] Date DME Agency Contacted: 05/13/24 Time DME Agency Contacted: 1525 Representative spoke with at DME Agency: Darlyn             Social Drivers of Health (SDOH) Interventions SDOH Screenings   Food Insecurity: Patient Unable To Answer (05/11/2024)  Housing: Unknown (05/11/2024)  Transportation Needs: Patient Unable To Answer (05/11/2024)  Utilities: Patient Unable To Answer (05/11/2024)  Social Connections: Patient Unable To Answer (05/11/2024)  Tobacco Use: High Risk (05/11/2024)    Readmission Risk Interventions    05/14/2024   12:50 PM  Readmission Risk Prevention Plan  Transportation Screening Complete  Home Care Screening Complete  Medication Review (RN CM) Complete

## 2024-05-19 NOTE — Progress Notes (Signed)
 " [name]  FMW:[id number] DOB: [birth year] DOA: 05/11/2024 PCP: Hector Jerel [name], MD   Brief Narrative:    Hector Lowe is a 89 y.o. male with medical history significant for CKD 3, hypertension, gout. Patient was brought to the ED via EMS with reports of 1 week of progressive generalized weakness, sore throat, cough, nausea and vomiting and lower abdominal pain.  Over the past week he has barely had any oral intake, he has had vomiting with every oral intake, and has not been able to stay hydrated.  Patient was admitted with acute hypoxemic respiratory failure secondary to influenza A infection with likely superimposed pneumonia.  He also has new onset atrial fibrillation with RVR.  He currently continues to have significant amounts of weakness and poor oral intake with hypernatremia.  PT/OT evaluation recommending SNF on discharge, but he continues to have poor oral intake and palliative consulted.  Family members understand that he may require hospice should his intake not improve.  05/18/24 Conference with patient's son Mr. Hector Recker, RN Hector Lowe Oral intake remains poor/challenging resulting in occasional episodes of hypoglycemia requiring IV dextrose  infusion No Nausea, Vomiting or Diarrhea - Patient's son and grand daughter asked the RN to call me to discuss transitioning to comfort care/hospice today 05/18/2024, Lowe than  waiting another day  -- They are requesting transition to full comfort care/hospice protocols and possible transfer to residential hospice if patient continues to be unable to eat enough to sustain himself - [facility] hosp[facility] unable to take patient until 05/21/2024 due to influenza positive status  Assessment & Plan:   Principal Problem:   Hypernatremia Active Problems:   Influenza A   Acute hypoxic respiratory failure (HCC)   Atrial fibrillation with RVR (HCC)  Assessment and Plan:   1-influenza A  infection with presumed superimposed pneumonia - Treatment with Tamiflu  completed -Completed 5 days of appropriate antibiotics for pneumonia -Feed liberally as able,  no further iv dextrose  --At family request transition patient to full comfort care on 05/18/2024 -- [facility] hosp[facility] unable to take patient until 05/21/2024 due to influenza positive status   2-acute hypoxic respiratory failure - Secondary to problem #1 - Comfort care as  below in #9   3-new onset atrial fibrillation with RVR at time of admission - Excellent response to initial treatment using IV Lopressor  - - 2D echo demonstrating preserved ejection fraction and no significant valvular disorder. -- Comfort care as  below in #9  4-chronic kidney disease stage IIIb - Appears to be close to his baseline,  -- Comfort care as  below in #9   5-hypertension - - Comfort care as  below in #9   6-severe protein calorie malnutrition -Body mass index is 14.68 kg/m.  - Continue supportive care. -Speech pathology consult appreciated - Feeding supplements twice a day between meals have been started. - Concern for failure to thrive noted and appreciate palliative consultation -=-Oral intake remains poor/challenging resulting in occasional episodes of hypoglycemia requiring IV dextrose  infusion No Nausea, Vomiting or Diarrhea -- Comfort care as  below in #9  7-hypernatremia - Resolved, continue now on LR due to poor oral intake and risk for dehydration  8-weakness/deconditioning/anorexia -- Comfort care as  below in #9  9) social/ethics--- palliative care consult appreciated --05/18/24 Conference with patient's son Mr. Hector Frampton, RN Hector Lowe Oral intake remains poor/challenging resulting in occasional episodes of hypoglycemia requiring IV dextrose  infusion No Nausea, Vomiting or Diarrhea -  Patient's son and grand daughter asked the RN to call me to discuss transitioning to comfort care/hospice  today 05/18/2024, Lowe than  waiting another day  -- They are requesting transition to full comfort care/hospice protocols and possible transfer to residential hospice if patient continues to be unable to eat enough to sustain himself - [facility] unable to take patient until 05/21/2024 due to influenza positive status     DVT prophylaxis:Heparin  Code Status: DNR Family Communication: Son and granddaughter are primary contact disposition Plan: - Comfort care as above in #9 Status is: Inpatient  Consultants:  Palliative care  Antimicrobials:  Anti-infectives (From admission, onward)    Start     Dose/Rate Route Frequency Ordered Stop   05/15/24 1000  cefTRIAXone  (ROCEPHIN ) 2 g in sodium chloride  0.9 % 100 mL IVPB        2 g 200 mL/hr over 30 Minutes Intravenous Every 24 hours 05/15/24 0848 05/15/24 0958   05/12/24 1500  cefTRIAXone  (ROCEPHIN ) 2 g in sodium chloride  0.9 % 100 mL IVPB  Status:  Discontinued        2 g 200 mL/hr over 30 Minutes Intravenous Every 24 hours 05/11/24 1700 05/15/24 0848   05/11/24 1615  oseltamivir  (TAMIFLU ) capsule 75 mg  Status:  Discontinued       Note to Pharmacy: Pls adjust dose as indicated, thanks.   75 mg Oral Daily 05/11/24 1600 05/11/24 1611   05/11/24 1615  oseltamivir  (TAMIFLU ) capsule 30 mg       Note to Pharmacy: Pls adjust dose as indicated, thanks.   30 mg Oral Daily 05/11/24 1611 05/15/24 0900   05/11/24 1530  cefTRIAXone  (ROCEPHIN ) 1 g in sodium chloride  0.9 % 100 mL IVPB        1 g 200 mL/hr over 30 Minutes Intravenous  Once 05/11/24 1519 05/11/24 1604   05/11/24 1530  azithromycin  (ZITHROMAX ) 500 mg in sodium chloride  0.9 % 250 mL IVPB        500 mg 250 mL/hr over 60 Minutes Intravenous Every 24 hours 05/11/24 1519 05/15/24 1616       Subjective: -  -Patient's son and granddaughter Hector Lowe at bedside, questions -- [facility] unable to take patient until 05/21/2024 due to influenza  positive status  Objective: Vitals:   05/18/24 0420 05/18/24 1304 05/18/24 2005 05/19/24 0432  BP: 116/78 109/86 113/78 128/79  Pulse: 83 89 79 91  Resp: 18 17 19 18   Temp:  98 F (36.7 C) 98.1 F (36.7 C) 97.7 F (36.5 C)  TempSrc:   Oral Oral  SpO2: 94% 99% 95% 98%  Weight:      Height:       No intake or output data in the 24 hours ending 05/19/24 1823  Filed Weights   05/11/24 1710  Weight: 46.4 kg    Physical Exam  Gen:-Awake Alert, in no acute distress,frail, emaciated/cachectic appearing HEENT:- [place].AT, No sclera icterus Neck-Supple Neck,No JVD,.  Lungs-  CTAB , fair air movement bilaterally  CV- S1, S2 normal, RRR Abd-  +ve B.Sounds, Abd Soft, No tenderness,    Extremity/Skin:- No  edema,   good pedal pulses  Psych-affect is flat,, cognitive and memory deficits, Neuro-generalized weakness, no new focal deficits, no tremors  Data Reviewed: I have personally reviewed following labs and imaging studies  CBC: Recent Labs  Lab 05/13/24 0451 05/14/24 0744 05/15/24 0451 05/16/24 0441  WBC 8.1 11.3* 8.3 8.1  HGB 12.3* 13.2 11.9* 12.0*  HCT 40.4 43.7  39.2 39.8  MCV 100.0 101.2* 100.8* 100.5*  PLT 128* 156 139* 151   Basic Metabolic Panel: Recent Labs  Lab 05/13/24 0451 05/14/24 0744 05/15/24 0451 05/16/24 0441  NA 149* 148* 143 143  K 4.5 4.9 4.2 4.1  CL 117* 115* 110 110  CO2 16* 18* 22 22  GLUCOSE 65* 92 126* 74  BUN 47* 55* 50* 41*  CREATININE 1.87* 1.95* 2.04* 1.66*  CALCIUM  8.3* 8.8* 8.3* 8.6*  MG  --  2.4 2.2 2.3   GFR: Estimated Creatinine Clearance: 15.5 mL/min (A) (by C-G formula based on SCr of 1.66 mg/dL (H)).  CBG: Recent Labs  Lab 05/18/24 0006 05/18/24 0421 05/18/24 0741 05/18/24 0831 05/18/24 2230  GLUCAP 83 78 64* 88 90   Sepsis Labs: Recent Labs  Lab 05/14/24 0744 05/15/24 0451 05/16/24 0441  LATICACIDVEN 2.5* 2.3* 2.3*    Recent Results (from the past 240 hours)  Resp panel by RT-PCR (RSV, Flu A&B, Covid)  Anterior Nasal Swab     Status: Abnormal   Collection Time: 05/11/24 12:36 PM   Specimen: Anterior Nasal Swab  Result Value Ref Range Status   SARS Coronavirus 2 by RT PCR NEGATIVE NEGATIVE Final    Comment: (NOTE) SARS-CoV-2 target nucleic acids are NOT DETECTED.  The SARS-CoV-2 RNA is generally detectable in upper respiratory specimens during the acute phase of infection. The lowest concentration of SARS-CoV-2 viral copies this assay can detect is 138 copies/mL. A negative result does not preclude SARS-Cov-2 infection and should not be used as the sole basis for treatment or other patient management decisions. A negative result may occur with  improper specimen collection/handling, submission of specimen other than nasopharyngeal swab, presence of viral mutation(s) within the areas targeted by this assay, and inadequate number of viral copies(<138 copies/mL). A negative result must be combined with clinical observations, patient history, and epidemiological information. The expected result is Negative.  Fact Sheet for Patients:  bloggercourse.com  Fact Sheet for Healthcare Providers:  seriousbroker.it  This test is no t yet approved or cleared by the [place]  FDA and  has been authorized for detection and/or diagnosis of SARS-CoV-2 by FDA under an Emergency Use Authorization (EUA). This EUA will remain  in effect (meaning this test can be used) for the duration of the COVID-19 declaration under Section 564(b)(1) of the Act, 21 U.S.C.section 360bbb-3(b)(1), unless the authorization is terminated  or revoked sooner.       Influenza A by PCR POSITIVE (A) NEGATIVE Final   Influenza B by PCR NEGATIVE NEGATIVE Final    Comment: (NOTE) The Xpert Xpress SARS-CoV-2/FLU/RSV plus assay is intended as an aid in the diagnosis of influenza from Nasopharyngeal swab specimens and should not be used as a sole basis for treatment. Nasal  washings and aspirates are unacceptable for Xpert Xpress SARS-CoV-2/FLU/RSV testing.  Fact Sheet for Patients: bloggercourse.com  Fact Sheet for Healthcare Providers: seriousbroker.it  This test is not yet approved or cleared by the [place]  FDA and has been authorized for detection and/or diagnosis of SARS-CoV-2 by FDA under an Emergency Use Authorization (EUA). This EUA will remain in effect (meaning this test can be used) for the duration of the COVID-19 declaration under Section 564(b)(1) of the Act, 21 U.S.C. section 360bbb-3(b)(1), unless the authorization is terminated or revoked.     Resp Syncytial Virus by PCR NEGATIVE NEGATIVE Final    Comment: (NOTE) Fact Sheet for Patients: bloggercourse.com  Fact Sheet for Healthcare Providers: seriousbroker.it  This test is  not yet approved or cleared by the [place]  FDA and has been authorized for detection and/or diagnosis of SARS-CoV-2 by FDA under an Emergency Use Authorization (EUA). This EUA will remain in effect (meaning this test can be used) for the duration of the COVID-19 declaration under Section 564(b)(1) of the Act, 21 U.S.C. section 360bbb-3(b)(1), unless the authorization is terminated or revoked.  Performed at [facility], [address] [place], [place] [address]   Culture, blood (routine x 2)     Status: None   Collection Time: 05/11/24  4:22 PM   Specimen: BLOOD LEFT FOREARM  Result Value Ref Range Status   Specimen Description BLOOD LEFT FOREARM AEROBIC BOTTLE ONLY  Final   Special Requests   Final    Blood Culture results may not be optimal due to an inadequate volume of blood received in culture bottles   Culture   Final    NO GROWTH 5 DAYS Performed at [facility], [address], [place], [place] [address]    Report Status 05/16/2024 FINAL  Final  Culture, blood (routine x 2)     Status:  None   Collection Time: 05/11/24  4:22 PM   Specimen: Left Antecubital; Blood  Result Value Ref Range Status   Specimen Description   Final    LEFT ANTECUBITAL BOTTLES DRAWN AEROBIC AND ANAEROBIC   Special Requests   Final    Blood Culture results may not be optimal due to an inadequate volume of blood received in culture bottles   Culture   Final    NO GROWTH 5 DAYS Performed at [facility], [address] [place], [place] [address]    Report Status 05/16/2024 FINAL  Final  Resp panel by RT-PCR (RSV, Flu A&B, Covid) Anterior Nasal Swab     Status: Abnormal   Collection Time: 05/19/24 11:46 AM   Specimen: Anterior Nasal Swab  Result Value Ref Range Status   SARS Coronavirus 2 by RT PCR NEGATIVE NEGATIVE Final    Comment: (NOTE) SARS-CoV-2 target nucleic acids are NOT DETECTED.  The SARS-CoV-2 RNA is generally detectable in upper respiratory specimens during the acute phase of infection. The lowest concentration of SARS-CoV-2 viral copies this assay can detect is 138 copies/mL. A negative result does not preclude SARS-Cov-2 infection and should not be used as the sole basis for treatment or other patient management decisions. A negative result may occur with  improper specimen collection/handling, submission of specimen other than nasopharyngeal swab, presence of viral mutation(s) within the areas targeted by this assay, and inadequate number of viral copies(<138 copies/mL). A negative result must be combined with clinical observations, patient history, and epidemiological information. The expected result is Negative.  Fact Sheet for Patients:  bloggercourse.com  Fact Sheet for Healthcare Providers:  seriousbroker.it  This test is no t yet approved or cleared by the [place]  FDA and  has been authorized for detection and/or diagnosis of SARS-CoV-2 by FDA under an Emergency Use Authorization (EUA). This EUA will remain   in effect (meaning this test can be used) for the duration of the COVID-19 declaration under Section 564(b)(1) of the Act, 21 U.S.C.section 360bbb-3(b)(1), unless the authorization is terminated  or revoked sooner.       Influenza A by PCR POSITIVE (A) NEGATIVE Final   Influenza B by PCR NEGATIVE NEGATIVE Final    Comment: (NOTE) The Xpert Xpress SARS-CoV-2/FLU/RSV plus assay is intended as an aid in the diagnosis of influenza from Nasopharyngeal swab specimens and should  not be used as a sole basis for treatment. Nasal washings and aspirates are unacceptable for Xpert Xpress SARS-CoV-2/FLU/RSV testing.  Fact Sheet for Patients: bloggercourse.com  Fact Sheet for Healthcare Providers: seriousbroker.it  This test is not yet approved or cleared by the [place]  FDA and has been authorized for detection and/or diagnosis of SARS-CoV-2 by FDA under an Emergency Use Authorization (EUA). This EUA will remain in effect (meaning this test can be used) for the duration of the COVID-19 declaration under Section 564(b)(1) of the Act, 21 U.S.C. section 360bbb-3(b)(1), unless the authorization is terminated or revoked.     Resp Syncytial Virus by PCR NEGATIVE NEGATIVE Final    Comment: (NOTE) Fact Sheet for Patients: bloggercourse.com  Fact Sheet for Healthcare Providers: seriousbroker.it  This test is not yet approved or cleared by the [place]  FDA and has been authorized for detection and/or diagnosis of SARS-CoV-2 by FDA under an Emergency Use Authorization (EUA). This EUA will remain in effect (meaning this test can be used) for the duration of the COVID-19 declaration under Section 564(b)(1) of the Act, 21 U.S.C. section 360bbb-3(b)(1), unless the authorization is terminated or revoked.  Performed at [facility], [address], [place], [place] [address]     Scheduled  Meds:  aspirin   81 mg Oral Daily   feeding supplement  237 mL Oral BID BM   metoprolol  tartrate  25 mg Oral BID   mouth rinse  15 mL Mouth Rinse 4 times per day    LOS: 8 days   Callahan Wild,MD [facility]  If 7PM-7AM, please contact night-coverage [email or web address] 05/19/2024, 6:23 PM   "

## 2024-05-19 NOTE — Progress Notes (Signed)
" °  Patient is ready for discharge to residential hospice, however,- [facility] unable to take patient until 05/21/2024 due to influenza positive status  Rendall Carwin, MD  "

## 2024-05-19 NOTE — Plan of Care (Signed)
" °  Problem: Education: Goal: Knowledge of General Education information will improve Description: Including pain rating scale, medication(s)/side effects and non-pharmacologic comfort measures Outcome: Progressing   Problem: Health Behavior/Discharge Planning: Goal: Ability to manage health-related needs will improve Outcome: Progressing   Problem: Clinical Measurements: Goal: Ability to maintain clinical measurements within normal limits will improve Outcome: Progressing Goal: Will remain free from infection Outcome: Progressing Goal: Diagnostic test results will improve Outcome: Progressing Goal: Respiratory complications will improve Outcome: Progressing   Problem: Activity: Goal: Risk for activity intolerance will decrease Outcome: Progressing   Problem: Nutrition: Goal: Adequate nutrition will be maintained Outcome: Progressing   Problem: Coping: Goal: Level of anxiety will decrease Outcome: Progressing   Problem: Elimination: Goal: Will not experience complications related to urinary retention Outcome: Progressing   Problem: Safety: Goal: Ability to remain free from injury will improve Outcome: Progressing   Problem: Skin Integrity: Goal: Risk for impaired skin integrity will decrease Outcome: Progressing   Problem: Activity: Goal: Ability to tolerate increased activity will improve Outcome: Progressing   Problem: Clinical Measurements: Goal: Ability to maintain a body temperature in the normal range will improve Outcome: Progressing   Problem: Respiratory: Goal: Ability to maintain adequate ventilation will improve Outcome: Progressing Goal: Ability to maintain a clear airway will improve Outcome: Progressing   Problem: Education: Goal: Knowledge of disease or condition will improve Outcome: Progressing Goal: Understanding of medication regimen will improve Outcome: Progressing Goal: Individualized Educational Video(s) Outcome: Progressing    Problem: Activity: Goal: Ability to tolerate increased activity will improve Outcome: Progressing   Problem: Cardiac: Goal: Ability to achieve and maintain adequate cardiopulmonary perfusion will improve Outcome: Progressing   Problem: Education: Goal: Knowledge of the prescribed therapeutic regimen will improve Outcome: Progressing   Problem: Coping: Goal: Ability to identify and develop effective coping behavior will improve Outcome: Progressing   Problem: Clinical Measurements: Goal: Quality of life will improve Outcome: Progressing   Problem: Respiratory: Goal: Verbalizations of increased ease of respirations will increase Outcome: Progressing   Problem: Role Relationship: Goal: Family's ability to cope with current situation will improve Outcome: Progressing Goal: Ability to verbalize concerns, feelings, and thoughts to partner or family member will improve Outcome: Progressing   Problem: Pain Management: Goal: Satisfaction with pain management regimen will improve Outcome: Progressing   "

## 2024-05-20 DIAGNOSIS — E870 Hyperosmolality and hypernatremia: Secondary | ICD-10-CM | POA: Diagnosis not present

## 2024-05-20 MED ORDER — MAGIC MOUTHWASH W/LIDOCAINE
5.0000 mL | Freq: Three times a day (TID) | ORAL | Status: DC
  Administered 2024-05-20 – 2024-05-23 (×9): 5 mL via ORAL
  Filled 2024-05-20 (×12): qty 5

## 2024-05-20 NOTE — Plan of Care (Signed)
 Palliative:  Reviewed records and MAR. Noted full comfort care. Awaiting hospice facility placement.   Bernarda Kitty, NP Palliative Medicine Team Pager [phone] (Please see [email or web address] for schedule) Team Phone [phone]

## 2024-05-20 NOTE — Progress Notes (Signed)
" ° °  Brief Progress Note   _____________________________________________________________________________________________________________  Patient Name: Hector Lowe Patient DOB: 1921/09/15 Date: @TODAY @   Pt needs a neg Flu swab and needs to be repeated on 05/21/24, Pt going to [facility]  _____________________________________________________________________________________________________________  [facility] RN Expeditor Ronal DELENA Bald Please contact us  directly via secure chat (search for [facility]) or by calling us  at [phone] [facility]).  "

## 2024-05-20 NOTE — Plan of Care (Signed)
" °  Problem: Education: Goal: Knowledge of General Education information will improve Description: Including pain rating scale, medication(s)/side effects and non-pharmacologic comfort measures Outcome: Progressing   Problem: Health Behavior/Discharge Planning: Goal: Ability to manage health-related needs will improve Outcome: Progressing   Problem: Clinical Measurements: Goal: Ability to maintain clinical measurements within normal limits will improve Outcome: Progressing Goal: Will remain free from infection Outcome: Progressing Goal: Diagnostic test results will improve Outcome: Progressing Goal: Respiratory complications will improve Outcome: Progressing   Problem: Activity: Goal: Risk for activity intolerance will decrease Outcome: Progressing   Problem: Nutrition: Goal: Adequate nutrition will be maintained Outcome: Progressing   Problem: Coping: Goal: Level of anxiety will decrease Outcome: Progressing   Problem: Elimination: Goal: Will not experience complications related to urinary retention Outcome: Progressing   Problem: Safety: Goal: Ability to remain free from injury will improve Outcome: Progressing   Problem: Skin Integrity: Goal: Risk for impaired skin integrity will decrease Outcome: Progressing   Problem: Activity: Goal: Ability to tolerate increased activity will improve Outcome: Progressing   Problem: Clinical Measurements: Goal: Ability to maintain a body temperature in the normal range will improve Outcome: Progressing   Problem: Respiratory: Goal: Ability to maintain adequate ventilation will improve Outcome: Progressing Goal: Ability to maintain a clear airway will improve Outcome: Progressing   Problem: Education: Goal: Knowledge of disease or condition will improve Outcome: Progressing Goal: Understanding of medication regimen will improve Outcome: Progressing Goal: Individualized Educational Video(s) Outcome: Progressing    Problem: Activity: Goal: Ability to tolerate increased activity will improve Outcome: Progressing   Problem: Cardiac: Goal: Ability to achieve and maintain adequate cardiopulmonary perfusion will improve Outcome: Progressing   Problem: Education: Goal: Knowledge of the prescribed therapeutic regimen will improve Outcome: Progressing   Problem: Coping: Goal: Ability to identify and develop effective coping behavior will improve Outcome: Progressing   Problem: Clinical Measurements: Goal: Quality of life will improve Outcome: Progressing   Problem: Respiratory: Goal: Verbalizations of increased ease of respirations will increase Outcome: Progressing   Problem: Role Relationship: Goal: Family's ability to cope with current situation will improve Outcome: Progressing Goal: Ability to verbalize concerns, feelings, and thoughts to partner or family member will improve Outcome: Progressing   Problem: Pain Management: Goal: Satisfaction with pain management regimen will improve Outcome: Progressing   "

## 2024-05-20 NOTE — Progress Notes (Addendum)
 " [name]  FMW:[id number] DOB: [birth year] DOA: 05/11/2024 PCP: [name], MD   Brief Narrative:    Hector Lowe is a 89 y.o. male with medical history significant for CKD 3, hypertension, gout. Patient was brought to the ED via EMS with reports of 1 week of progressive generalized weakness, sore throat, cough, nausea and vomiting and lower abdominal pain.  Over the past week he has barely had any oral intake, he has had vomiting with every oral intake, and has not been able to stay hydrated.  Patient was admitted with acute hypoxemic respiratory failure secondary to influenza A infection with likely superimposed pneumonia.  He also has new onset atrial fibrillation with RVR.  He currently continues to have significant amounts of weakness and poor oral intake with hypernatremia.  PT/OT evaluation recommending SNF on discharge, but he continues to have poor oral intake and palliative consulted.  Family members understand that he may require hospice should his intake not improve.  NB!! continues to be unable to eat enough to sustain himself - At family request patient transition to full comfort care on 05/18/2024  - [facility] unable to take patient due to positive flu test  - Hospice will reevaluate patient on 05/21/2024, hospice may decide to repeat flu test on 05/21/2024. -Patient without fevers or any other influenza symptoms for several days now  Assessment & Plan:   Principal Problem:   Hypernatremia Active Problems:   Influenza A   Acute hypoxic respiratory failure (HCC)   Atrial fibrillation with RVR (HCC)  Assessment and Plan:   1-influenza A infection with presumed superimposed pneumonia - Treatment with Tamiflu  completed -Completed 5 days of appropriate antibiotics for pneumonia -Feed liberally as able,  no further iv dextrose  --At family request transitioned patient to full comfort care on 05/18/2024 -- continues to  be unable to eat enough to sustain himself -Patient without fevers or any other influenza symptoms for several days now   2-acute hypoxic respiratory failure - Secondary to problem #1 -Hypoxia has resolved patient has been weaned off oxygen completely - Comfort care as  below in #9   3-new onset atrial fibrillation with RVR at time of admission - Excellent response to initial treatment using IV Lopressor  - - 2D echo demonstrating preserved ejection fraction and no significant valvular disorder. -- Comfort care as  below in #9  4-chronic kidney disease stage IIIb - Appears to be close to his baseline,  -- Comfort care as  below in #9  5-hypertension - - Comfort care as  below in #9   6-severe protein calorie malnutrition -Body mass index is 14.68 kg/m.  - Continue supportive care. -Speech pathology consult appreciated - Feeding supplements twice a day between meals have been started. - Concern for failure to thrive noted and appreciate palliative consultation -=-Oral intake remains poor/challenging resulting in occasional episodes of hypoglycemia requiring IV dextrose  infusion -No further IV dextrose  as patient is now full comfort care -continues to be unable to eat enough to sustain himself No Nausea, Vomiting or Diarrhea -- Comfort care as  below in #9  7-hypernatremia - Resolved, continue now on LR due to poor oral intake and risk for dehydration  8-weakness/deconditioning/anorexia -- Comfort care as  below in #9  9) social/ethics--- palliative care consult appreciated Conference with patient's son Mr. Dallan Schonberg, RN [facility] Oral intake remains poor/challenging resulting in occasional episodes of hypoglycemia requiring IV dextrose  infusion No Nausea, Vomiting  or Diarrhea -continues to be unable to eat enough to sustain himself - At family request patient transitioned to full comfort care on 05/18/2024  - [facility] unable to take patient  due to positive flu test  - Hospice will reevaluate patient on 05/21/2024, hospice may decide to repeat flu test on 05/21/2024. -Patient without fevers or any other influenza symptoms for several days now     DVT prophylaxis:Heparin  Code Status: DNR Family Communication: Son and granddaughter are primary contact disposition Plan: - Comfort care as above in #9 -[facility] unable to take patient due to positive flu test. - Hospice will reevaluate patient on 05/21/2024, hospice may decide to repeat flu test on 05/21/2024.  Status is: Inpatient  Consultants:  Palliative care/hospice services  Antimicrobials:  Anti-infectives (From admission, onward)    Start     Dose/Rate Route Frequency Ordered Stop   05/15/24 1000  cefTRIAXone  (ROCEPHIN ) 2 g in sodium chloride  0.9 % 100 mL IVPB        2 g 200 mL/hr over 30 Minutes Intravenous Every 24 hours 05/15/24 0848 05/15/24 0958   05/12/24 1500  cefTRIAXone  (ROCEPHIN ) 2 g in sodium chloride  0.9 % 100 mL IVPB  Status:  Discontinued        2 g 200 mL/hr over 30 Minutes Intravenous Every 24 hours 05/11/24 1700 05/15/24 0848   05/11/24 1615  oseltamivir  (TAMIFLU ) capsule 75 mg  Status:  Discontinued       Note to Pharmacy: Pls adjust dose as indicated, thanks.   75 mg Oral Daily 05/11/24 1600 05/11/24 1611   05/11/24 1615  oseltamivir  (TAMIFLU ) capsule 30 mg       Note to Pharmacy: Pls adjust dose as indicated, thanks.   30 mg Oral Daily 05/11/24 1611 05/15/24 0900   05/11/24 1530  cefTRIAXone  (ROCEPHIN ) 1 g in sodium chloride  0.9 % 100 mL IVPB        1 g 200 mL/hr over 30 Minutes Intravenous  Once 05/11/24 1519 05/11/24 1604   05/11/24 1530  azithromycin  (ZITHROMAX ) 500 mg in sodium chloride  0.9 % 250 mL IVPB        500 mg 250 mL/hr over 60 Minutes Intravenous Every 24 hours 05/11/24 1519 05/15/24 1616       Subjective: -  -Patient's son and granddaughter Brittany at bedside, questions  [facility] unable to take patient due to positive flu test  - Hospice will reevaluate patient on 05/21/2024, hospice may decide to repeat flu test on 05/21/2024. - Patient without fevers or any other influenza symptoms for several days now -  Objective: Vitals:   05/18/24 2005 05/19/24 0432 05/19/24 2045 05/20/24 1406  BP: 113/78 128/79 (!) 142/95 129/84  Pulse: 79 91 (!) 109 (!) 102  Resp: 19 18  18   Temp: 98.1 F (36.7 C) 97.7 F (36.5 C) 98.2 F (36.8 C) 98 F (36.7 C)  TempSrc: Oral Oral Oral Oral  SpO2: 95% 98%  97%  Weight:      Height:        Intake/Output Summary (Last 24 hours) at 05/20/2024 1758 Last data filed at 05/20/2024 0919 Gross per 24 hour  Intake 300 ml  Output 300 ml  Net 0 ml    Filed Weights   05/11/24 1710  Weight: 46.4 kg    Physical Exam  Gen:-Awake Alert, in no acute distress,frail, emaciated/cachectic appearing HEENT:- [place].AT, No sclera icterus Neck-Supple Neck,No JVD,.  Lungs-  CTAB , fair air movement bilaterally  CV- S1, S2 normal, RRR Abd-  +ve B.Sounds, Abd Soft, No tenderness,    Extremity/Skin:- No  edema,   good pedal pulses  Psych-affect is flat,, cognitive and memory deficits, Neuro-generalized weakness, no new focal deficits, no tremors  Data Reviewed: I have personally reviewed following labs and imaging studies  CBC: Recent Labs  Lab 05/14/24 0744 05/15/24 0451 05/16/24 0441  WBC 11.3* 8.3 8.1  HGB 13.2 11.9* 12.0*  HCT 43.7 39.2 39.8  MCV 101.2* 100.8* 100.5*  PLT 156 139* 151   Basic Metabolic Panel: Recent Labs  Lab 05/14/24 0744 05/15/24 0451 05/16/24 0441  NA 148* 143 143  K 4.9 4.2 4.1  CL 115* 110 110  CO2 18* 22 22  GLUCOSE 92 126* 74  BUN 55* 50* 41*  CREATININE 1.95* 2.04* 1.66*  CALCIUM  8.8* 8.3* 8.6*  MG 2.4 2.2 2.3   GFR: Estimated Creatinine Clearance: 15.5 mL/min (A) (by C-G formula based on SCr of 1.66 mg/dL (H)).  CBG: Recent Labs  Lab 05/18/24 0421 05/18/24 0741 05/18/24 0831  05/18/24 2230 05/19/24 2031  GLUCAP 78 64* 88 90 108*   Sepsis Labs: Recent Labs  Lab 05/14/24 0744 05/15/24 0451 05/16/24 0441  LATICACIDVEN 2.5* 2.3* 2.3*    Recent Results (from the past 240 hours)  Resp panel by RT-PCR (RSV, Flu A&B, Covid) Anterior Nasal Swab     Status: Abnormal   Collection Time: 05/11/24 12:36 PM   Specimen: Anterior Nasal Swab  Result Value Ref Range Status   SARS Coronavirus 2 by RT PCR NEGATIVE NEGATIVE Final    Comment: (NOTE) SARS-CoV-2 target nucleic acids are NOT DETECTED.  The SARS-CoV-2 RNA is generally detectable in upper respiratory specimens during the acute phase of infection. The lowest concentration of SARS-CoV-2 viral copies this assay can detect is 138 copies/mL. A negative result does not preclude SARS-Cov-2 infection and should not be used as the sole basis for treatment or other patient management decisions. A negative result may occur with  improper specimen collection/handling, submission of specimen other than nasopharyngeal swab, presence of viral mutation(s) within the areas targeted by this assay, and inadequate number of viral copies(<138 copies/mL). A negative result must be combined with clinical observations, patient history, and epidemiological information. The expected result is Negative.  Fact Sheet for Patients:  bloggercourse.com  Fact Sheet for Healthcare Providers:  seriousbroker.it  This test is no t yet approved or cleared by the [place]  FDA and  has been authorized for detection and/or diagnosis of SARS-CoV-2 by FDA under an Emergency Use Authorization (EUA). This EUA will remain  in effect (meaning this test can be used) for the duration of the COVID-19 declaration under Section 564(b)(1) of the Act, 21 U.S.C.section 360bbb-3(b)(1), unless the authorization is terminated  or revoked sooner.       Influenza A by PCR POSITIVE (A) NEGATIVE Final    Influenza B by PCR NEGATIVE NEGATIVE Final    Comment: (NOTE) The Xpert Xpress SARS-CoV-2/FLU/RSV plus assay is intended as an aid in the diagnosis of influenza from Nasopharyngeal swab specimens and should not be used as a sole basis for treatment. Nasal washings and aspirates are unacceptable for Xpert Xpress SARS-CoV-2/FLU/RSV testing.  Fact Sheet for Patients: bloggercourse.com  Fact Sheet for Healthcare Providers: seriousbroker.it  This test is not yet approved or cleared by the [place]  FDA and has been authorized for detection and/or diagnosis of SARS-CoV-2 by FDA under an Emergency Use Authorization (EUA). This EUA will remain in effect (  meaning this test can be used) for the duration of the COVID-19 declaration under Section 564(b)(1) of the Act, 21 U.S.C. section 360bbb-3(b)(1), unless the authorization is terminated or revoked.     Resp Syncytial Virus by PCR NEGATIVE NEGATIVE Final    Comment: (NOTE) Fact Sheet for Patients: bloggercourse.com  Fact Sheet for Healthcare Providers: seriousbroker.it  This test is not yet approved or cleared by the [place]  FDA and has been authorized for detection and/or diagnosis of SARS-CoV-2 by FDA under an Emergency Use Authorization (EUA). This EUA will remain in effect (meaning this test can be used) for the duration of the COVID-19 declaration under Section 564(b)(1) of the Act, 21 U.S.C. section 360bbb-3(b)(1), unless the authorization is terminated or revoked.  Performed at [facility], [address], [place], [place] [address]   Culture, blood (routine x 2)     Status: None   Collection Time: 05/11/24  4:22 PM   Specimen: BLOOD LEFT FOREARM  Result Value Ref Range Status   Specimen Description BLOOD LEFT FOREARM AEROBIC BOTTLE ONLY  Final   Special Requests   Final    Blood Culture results may not be optimal  due to an inadequate volume of blood received in culture bottles   Culture   Final    NO GROWTH 5 DAYS Performed at [facility], [address] [place], [place] [address]    Report Status 05/16/2024 FINAL  Final  Culture, blood (routine x 2)     Status: None   Collection Time: 05/11/24  4:22 PM   Specimen: Left Antecubital; Blood  Result Value Ref Range Status   Specimen Description   Final    LEFT ANTECUBITAL BOTTLES DRAWN AEROBIC AND ANAEROBIC   Special Requests   Final    Blood Culture results may not be optimal due to an inadequate volume of blood received in culture bottles   Culture   Final    NO GROWTH 5 DAYS Performed at [facility], [address], [place], [place] [address]    Report Status 05/16/2024 FINAL  Final  Resp panel by RT-PCR (RSV, Flu A&B, Covid) Anterior Nasal Swab     Status: Abnormal   Collection Time: 05/19/24 11:46 AM   Specimen: Anterior Nasal Swab  Result Value Ref Range Status   SARS Coronavirus 2 by RT PCR NEGATIVE NEGATIVE Final    Comment: (NOTE) SARS-CoV-2 target nucleic acids are NOT DETECTED.  The SARS-CoV-2 RNA is generally detectable in upper respiratory specimens during the acute phase of infection. The lowest concentration of SARS-CoV-2 viral copies this assay can detect is 138 copies/mL. A negative result does not preclude SARS-Cov-2 infection and should not be used as the sole basis for treatment or other patient management decisions. A negative result may occur with  improper specimen collection/handling, submission of specimen other than nasopharyngeal swab, presence of viral mutation(s) within the areas targeted by this assay, and inadequate number of viral copies(<138 copies/mL). A negative result must be combined with clinical observations, patient history, and epidemiological information. The expected result is Negative.  Fact Sheet for Patients:  bloggercourse.com  Fact Sheet for Healthcare Providers:   seriousbroker.it  This test is no t yet approved or cleared by the [place]  FDA and  has been authorized for detection and/or diagnosis of SARS-CoV-2 by FDA under an Emergency Use Authorization (EUA). This EUA will remain  in effect (meaning this test can be used) for the duration of the COVID-19 declaration under Section 564(b)(1) of the  Act, 21 U.S.C.section 360bbb-3(b)(1), unless the authorization is terminated  or revoked sooner.       Influenza A by PCR POSITIVE (A) NEGATIVE Final   Influenza B by PCR NEGATIVE NEGATIVE Final    Comment: (NOTE) The Xpert Xpress SARS-CoV-2/FLU/RSV plus assay is intended as an aid in the diagnosis of influenza from Nasopharyngeal swab specimens and should not be used as a sole basis for treatment. Nasal washings and aspirates are unacceptable for Xpert Xpress SARS-CoV-2/FLU/RSV testing.  Fact Sheet for Patients: bloggercourse.com  Fact Sheet for Healthcare Providers: seriousbroker.it  This test is not yet approved or cleared by the [place]  FDA and has been authorized for detection and/or diagnosis of SARS-CoV-2 by FDA under an Emergency Use Authorization (EUA). This EUA will remain in effect (meaning this test can be used) for the duration of the COVID-19 declaration under Section 564(b)(1) of the Act, 21 U.S.C. section 360bbb-3(b)(1), unless the authorization is terminated or revoked.     Resp Syncytial Virus by PCR NEGATIVE NEGATIVE Final    Comment: (NOTE) Fact Sheet for Patients: bloggercourse.com  Fact Sheet for Healthcare Providers: seriousbroker.it  This test is not yet approved or cleared by the [place]  FDA and has been authorized for detection and/or diagnosis of SARS-CoV-2 by FDA under an Emergency Use Authorization (EUA). This EUA will remain in effect (meaning this test can be used)  for the duration of the COVID-19 declaration under Section 564(b)(1) of the Act, 21 U.S.C. section 360bbb-3(b)(1), unless the authorization is terminated or revoked.  Performed at [facility], [address], [place], [place] [address]     Scheduled Meds:  aspirin   81 mg Oral Daily   feeding supplement  237 mL Oral BID BM   magic mouthwash w/lidocaine   5 mL Oral TID AC   metoprolol  tartrate  25 mg Oral BID   mouth rinse  15 mL Mouth Rinse 4 times per day    LOS: 9 days   Bridgitt Raggio,MD [facility]  If 7PM-7AM, please contact night-coverage [email or web address] 05/20/2024, 5:58 PM   "

## 2024-05-21 DIAGNOSIS — E870 Hyperosmolality and hypernatremia: Secondary | ICD-10-CM | POA: Diagnosis not present

## 2024-05-21 LAB — RESP PANEL BY RT-PCR (RSV, FLU A&B, COVID)  RVPGX2
Influenza A by PCR: POSITIVE — AB
Influenza B by PCR: NEGATIVE
Resp Syncytial Virus by PCR: NEGATIVE
SARS Coronavirus 2 by RT PCR: NEGATIVE

## 2024-05-21 NOTE — TOC Progression Note (Signed)
 Transition of Care [facility]) - Progression Note    Patient Details  Name: Hector Lowe MRN: [id number] Date of Birth: [birth year]  [facility] CM/SW Contact  Sharlyne Stabs, RN Phone Number: 05/21/2024, 10:44 AM  Clinical Narrative:   [facility] completed assessment, repeat flu test is positive. Per their medical director they will reassess on Friday. Team updated.   Expected Discharge Plan: Skilled Nursing Facility Barriers to Discharge: Other (must enter comment) (Due to Flu waiting on Hospice to accept)  Expected Discharge Plan and Services In-house Referral: Clinical Social Work Discharge Planning Services: CM Consult Post Acute Care Choice: Durable Medical Equipment Living arrangements for the past 2 months: Single Family Home Expected Discharge Date: 05/19/24               DME Arranged: Oxygen DME Agency: [employer] Date DME Agency Contacted: 05/13/24 Time DME Agency Contacted: 1525 Representative spoke with at DME Agency: Darlyn             Social Drivers of Health (SDOH) Interventions SDOH Screenings   Food Insecurity: Patient Unable To Answer (05/11/2024)  Housing: Unknown (05/11/2024)  Transportation Needs: Patient Unable To Answer (05/11/2024)  Utilities: Patient Unable To Answer (05/11/2024)  Social Connections: Patient Unable To Answer (05/11/2024)  Tobacco Use: High Risk (05/11/2024)    Readmission Risk Interventions    05/14/2024   12:50 PM  Readmission Risk Prevention Plan  Transportation Screening Complete  Home Care Screening Complete  Medication Review (RN CM) Complete

## 2024-05-21 NOTE — Plan of Care (Signed)
" °  Problem: Education: Goal: Knowledge of General Education information will improve Description: Including pain rating scale, medication(s)/side effects and non-pharmacologic comfort measures Outcome: Progressing   Problem: Health Behavior/Discharge Planning: Goal: Ability to manage health-related needs will improve Outcome: Progressing   Problem: Clinical Measurements: Goal: Ability to maintain clinical measurements within normal limits will improve Outcome: Progressing Goal: Will remain free from infection Outcome: Progressing Goal: Diagnostic test results will improve Outcome: Progressing Goal: Respiratory complications will improve Outcome: Progressing   Problem: Activity: Goal: Risk for activity intolerance will decrease Outcome: Progressing   Problem: Nutrition: Goal: Adequate nutrition will be maintained Outcome: Progressing   Problem: Coping: Goal: Level of anxiety will decrease Outcome: Progressing   Problem: Elimination: Goal: Will not experience complications related to urinary retention Outcome: Progressing   Problem: Safety: Goal: Ability to remain free from injury will improve Outcome: Progressing   Problem: Skin Integrity: Goal: Risk for impaired skin integrity will decrease Outcome: Progressing   Problem: Activity: Goal: Ability to tolerate increased activity will improve Outcome: Progressing   Problem: Clinical Measurements: Goal: Ability to maintain a body temperature in the normal range will improve Outcome: Progressing   Problem: Respiratory: Goal: Ability to maintain adequate ventilation will improve Outcome: Progressing Goal: Ability to maintain a clear airway will improve Outcome: Progressing   Problem: Education: Goal: Knowledge of disease or condition will improve Outcome: Progressing Goal: Understanding of medication regimen will improve Outcome: Progressing Goal: Individualized Educational Video(s) Outcome: Progressing    Problem: Activity: Goal: Ability to tolerate increased activity will improve Outcome: Progressing   Problem: Cardiac: Goal: Ability to achieve and maintain adequate cardiopulmonary perfusion will improve Outcome: Progressing   Problem: Education: Goal: Knowledge of the prescribed therapeutic regimen will improve Outcome: Progressing   Problem: Coping: Goal: Ability to identify and develop effective coping behavior will improve Outcome: Progressing   Problem: Clinical Measurements: Goal: Quality of life will improve Outcome: Progressing   Problem: Respiratory: Goal: Verbalizations of increased ease of respirations will increase Outcome: Progressing   Problem: Role Relationship: Goal: Family's ability to cope with current situation will improve Outcome: Progressing Goal: Ability to verbalize concerns, feelings, and thoughts to partner or family member will improve Outcome: Progressing   Problem: Pain Management: Goal: Satisfaction with pain management regimen will improve Outcome: Progressing   "

## 2024-05-21 NOTE — Progress Notes (Addendum)
 " [name]  FMW:[id number] DOB: 08-15-1921 DOA: 05/11/2024 PCP: [name], MD   Brief Narrative:   89 y.o. male with medical history significant for CKD 3, hypertension, gout. Patient was brought to the ED via EMS with reports of 1 week of progressive generalized weakness, sore throat, cough, nausea and vomiting and lower abdominal pain.  Over the past week he has barely had any oral intake, he has had vomiting with every oral intake, and has not been able to stay hydrated.  Patient was admitted with acute hypoxemic respiratory failure secondary to influenza A infection with likely superimposed pneumonia.  He also has new onset atrial fibrillation with RVR.  He currently continues to have significant amounts of weakness and poor oral intake with hypernatremia.  Transitioned to full comfort care on 05/18/24. Residential hospice[facility] unable to take patient due to positive flu test   12/31: Medical director at [facility] said they will reassess on Friday   Assessment & Plan:  Principal Problem:   Hypernatremia Active Problems:   Influenza A   Acute hypoxic respiratory failure (HCC)   Atrial fibrillation with RVR (HCC)   -influenza A infection with presumed superimposed pneumonia - Treatment with Tamiflu  completed -Completed 5 days of appropriate antibiotics for pneumonia --At family request transitioned patient to full comfort care on 05/18/2024 -- continues to be unable to eat enough to sustain himself -Patient without fevers or any other influenza symptoms for several days now -Repeat testing done on 12/31 is positive for Flu A   Acute hypoxic respiratory failure - Secondary to above -Hypoxia has resolved patient has been weaned off oxygen completely - Comfort care    New onset atrial fibrillation with RVR:  - 2D echo demonstrating preserved ejection fraction and no significant valvular disorder. -- Comfort care   Chronic kidney disease stage  IIIb - Appears to be close to his baseline,  -- On comfort care   Hypertension   Severe protein calorie malnutrition -Body mass index is 14.68 kg/m.  -On comfort care now  Hypernatremia - Resolved   weakness/deconditioning/anorexia -- Comfort care now   Disposition: Hospice residential facility   DVT prophylaxis: Not indicated     Code Status: Do not attempt resuscitation (DNR) - Comfort care Family Communication:  None at the bedside Status is: Inpatient Remains inpatient appropriate because: on comfort care    Subjective:    Examination:  General exam: Obtunded Respiratory system: Clear to auscultation. Respiratory effort normal. Cardiovascular system: S1 & S2 heard, RRR. No JVD, murmurs, rubs, gallops or clicks. No pedal edema. Gastrointestinal system: Abdomen is nondistended, soft and nontender. No organomegaly or masses felt. Normal bowel sounds heard. Central nervous system: Obtunded Extremities: unable to assess power Skin: No rashes, lesions or ulcers     Diet Orders (From admission, onward)     Start     Ordered   05/13/24 1449  DIET - DYS 1 Room service appropriate? Yes; Fluid consistency: Thin  Diet effective now       Question Answer Comment  Room service appropriate? Yes   Fluid consistency: Thin      05/13/24 1449            Objective: Vitals:   05/18/24 2005 05/19/24 0432 05/19/24 2045 05/20/24 1406  BP: 113/78 128/79 (!) 142/95 129/84  Pulse: 79 91 (!) 109 (!) 102  Resp: 19 18  18   Temp: 98.1 F (36.7 C) 97.7 F (36.5 C) 98.2 F (  36.8 C) 98 F (36.7 C)  TempSrc: Oral Oral Oral Oral  SpO2: 95% 98%  97%  Weight:      Height:        Intake/Output Summary (Last 24 hours) at 05/21/2024 0946 Last data filed at 05/21/2024 0500 Gross per 24 hour  Intake 340 ml  Output 300 ml  Net 40 ml   Filed Weights   05/11/24 1710  Weight: 46.4 kg    Scheduled Meds:  aspirin   81 mg Oral Daily   feeding supplement  237 mL Oral BID  BM   magic mouthwash w/lidocaine   5 mL Oral TID AC   metoprolol  tartrate  25 mg Oral BID   mouth rinse  15 mL Mouth Rinse 4 times per day   Continuous Infusions:  Nutritional status     Body mass index is 14.68 kg/m.  Data Reviewed:   CBC: Recent Labs  Lab 05/15/24 0451 05/16/24 0441  WBC 8.3 8.1  HGB 11.9* 12.0*  HCT 39.2 39.8  MCV 100.8* 100.5*  PLT 139* 151   Basic Metabolic Panel: Recent Labs  Lab 05/15/24 0451 05/16/24 0441  NA 143 143  K 4.2 4.1  CL 110 110  CO2 22 22  GLUCOSE 126* 74  BUN 50* 41*  CREATININE 2.04* 1.66*  CALCIUM  8.3* 8.6*  MG 2.2 2.3   GFR: Estimated Creatinine Clearance: 15.5 mL/min (A) (by C-G formula based on SCr of 1.66 mg/dL (H)). Liver Function Tests: No results for input(s): AST, ALT, ALKPHOS, BILITOT, PROT, ALBUMIN in the last 168 hours. No results for input(s): LIPASE, AMYLASE in the last 168 hours. No results for input(s): AMMONIA in the last 168 hours. Coagulation Profile: No results for input(s): INR, PROTIME in the last 168 hours. Cardiac Enzymes: No results for input(s): CKTOTAL, CKMB, CKMBINDEX, TROPONINI in the last 168 hours. BNP (last 3 results) No results for input(s): PROBNP in the last 8760 hours. HbA1C: No results for input(s): HGBA1C in the last 72 hours. CBG: Recent Labs  Lab 05/18/24 0421 05/18/24 0741 05/18/24 0831 05/18/24 2230 05/19/24 2031  GLUCAP 78 64* 88 90 108*   Lipid Profile: No results for input(s): CHOL, HDL, LDLCALC, TRIG, CHOLHDL, LDLDIRECT in the last 72 hours. Thyroid  Function Tests: No results for input(s): TSH, T4TOTAL, FREET4, T3FREE, THYROIDAB in the last 72 hours. Anemia Panel: No results for input(s): VITAMINB12, FOLATE, FERRITIN, TIBC, IRON, RETICCTPCT in the last 72 hours. Sepsis Labs: Recent Labs  Lab 05/15/24 0451 05/16/24 0441  LATICACIDVEN 2.3* 2.3*    Recent Results (from the past 240 hours)   Resp panel by RT-PCR (RSV, Flu A&B, Covid) Anterior Nasal Swab     Status: Abnormal   Collection Time: 05/11/24 12:36 PM   Specimen: Anterior Nasal Swab  Result Value Ref Range Status   SARS Coronavirus 2 by RT PCR NEGATIVE NEGATIVE Final    Comment: (NOTE) SARS-CoV-2 target nucleic acids are NOT DETECTED.  The SARS-CoV-2 RNA is generally detectable in upper respiratory specimens during the acute phase of infection. The lowest concentration of SARS-CoV-2 viral copies this assay can detect is 138 copies/mL. A negative result does not preclude SARS-Cov-2 infection and should not be used as the sole basis for treatment or other patient management decisions. A negative result may occur with  improper specimen collection/handling, submission of specimen other than nasopharyngeal swab, presence of viral mutation(s) within the areas targeted by this assay, and inadequate number of viral copies(<138 copies/mL). A negative result must be combined with clinical  observations, patient history, and epidemiological information. The expected result is Negative.  Fact Sheet for Patients:  bloggercourse.com  Fact Sheet for Healthcare Providers:  seriousbroker.it  This test is no t yet approved or cleared by the [place]  FDA and  has been authorized for detection and/or diagnosis of SARS-CoV-2 by FDA under an Emergency Use Authorization (EUA). This EUA will remain  in effect (meaning this test can be used) for the duration of the COVID-19 declaration under Section 564(b)(1) of the Act, 21 U.S.C.section 360bbb-3(b)(1), unless the authorization is terminated  or revoked sooner.       Influenza A by PCR POSITIVE (A) NEGATIVE Final   Influenza B by PCR NEGATIVE NEGATIVE Final    Comment: (NOTE) The Xpert Xpress SARS-CoV-2/FLU/RSV plus assay is intended as an aid in the diagnosis of influenza from Nasopharyngeal swab specimens and should not  be used as a sole basis for treatment. Nasal washings and aspirates are unacceptable for Xpert Xpress SARS-CoV-2/FLU/RSV testing.  Fact Sheet for Patients: bloggercourse.com  Fact Sheet for Healthcare Providers: seriousbroker.it  This test is not yet approved or cleared by the [place]  FDA and has been authorized for detection and/or diagnosis of SARS-CoV-2 by FDA under an Emergency Use Authorization (EUA). This EUA will remain in effect (meaning this test can be used) for the duration of the COVID-19 declaration under Section 564(b)(1) of the Act, 21 U.S.C. section 360bbb-3(b)(1), unless the authorization is terminated or revoked.     Resp Syncytial Virus by PCR NEGATIVE NEGATIVE Final    Comment: (NOTE) Fact Sheet for Patients: bloggercourse.com  Fact Sheet for Healthcare Providers: seriousbroker.it  This test is not yet approved or cleared by the [place]  FDA and has been authorized for detection and/or diagnosis of SARS-CoV-2 by FDA under an Emergency Use Authorization (EUA). This EUA will remain in effect (meaning this test can be used) for the duration of the COVID-19 declaration under Section 564(b)(1) of the Act, 21 U.S.C. section 360bbb-3(b)(1), unless the authorization is terminated or revoked.  Performed at [facility], [address] [place], [place] [address]   Culture, blood (routine x 2)     Status: None   Collection Time: 05/11/24  4:22 PM   Specimen: BLOOD LEFT FOREARM  Result Value Ref Range Status   Specimen Description BLOOD LEFT FOREARM AEROBIC BOTTLE ONLY  Final   Special Requests   Final    Blood Culture results may not be optimal due to an inadequate volume of blood received in culture bottles   Culture   Final    NO GROWTH 5 DAYS Performed at [facility], [address] [place], [place] [address]    Report Status 05/16/2024 FINAL  Final   Culture, blood (routine x 2)     Status: None   Collection Time: 05/11/24  4:22 PM   Specimen: Left Antecubital; Blood  Result Value Ref Range Status   Specimen Description   Final    LEFT ANTECUBITAL BOTTLES DRAWN AEROBIC AND ANAEROBIC   Special Requests   Final    Blood Culture results may not be optimal due to an inadequate volume of blood received in culture bottles   Culture   Final    NO GROWTH 5 DAYS Performed at [facility], [address], [place], [place] [address]    Report Status 05/16/2024 FINAL  Final  Resp panel by RT-PCR (RSV, Flu A&B, Covid) Anterior Nasal Swab     Status: Abnormal   Collection Time: 05/19/24 11:46  AM   Specimen: Anterior Nasal Swab  Result Value Ref Range Status   SARS Coronavirus 2 by RT PCR NEGATIVE NEGATIVE Final    Comment: (NOTE) SARS-CoV-2 target nucleic acids are NOT DETECTED.  The SARS-CoV-2 RNA is generally detectable in upper respiratory specimens during the acute phase of infection. The lowest concentration of SARS-CoV-2 viral copies this assay can detect is 138 copies/mL. A negative result does not preclude SARS-Cov-2 infection and should not be used as the sole basis for treatment or other patient management decisions. A negative result may occur with  improper specimen collection/handling, submission of specimen other than nasopharyngeal swab, presence of viral mutation(s) within the areas targeted by this assay, and inadequate number of viral copies(<138 copies/mL). A negative result must be combined with clinical observations, patient history, and epidemiological information. The expected result is Negative.  Fact Sheet for Patients:  bloggercourse.com  Fact Sheet for Healthcare Providers:  seriousbroker.it  This test is no t yet approved or cleared by the [place]  FDA and  has been authorized for detection and/or diagnosis of SARS-CoV-2 by FDA under an Emergency Use  Authorization (EUA). This EUA will remain  in effect (meaning this test can be used) for the duration of the COVID-19 declaration under Section 564(b)(1) of the Act, 21 U.S.C.section 360bbb-3(b)(1), unless the authorization is terminated  or revoked sooner.       Influenza A by PCR POSITIVE (A) NEGATIVE Final   Influenza B by PCR NEGATIVE NEGATIVE Final    Comment: (NOTE) The Xpert Xpress SARS-CoV-2/FLU/RSV plus assay is intended as an aid in the diagnosis of influenza from Nasopharyngeal swab specimens and should not be used as a sole basis for treatment. Nasal washings and aspirates are unacceptable for Xpert Xpress SARS-CoV-2/FLU/RSV testing.  Fact Sheet for Patients: bloggercourse.com  Fact Sheet for Healthcare Providers: seriousbroker.it  This test is not yet approved or cleared by the [place]  FDA and has been authorized for detection and/or diagnosis of SARS-CoV-2 by FDA under an Emergency Use Authorization (EUA). This EUA will remain in effect (meaning this test can be used) for the duration of the COVID-19 declaration under Section 564(b)(1) of the Act, 21 U.S.C. section 360bbb-3(b)(1), unless the authorization is terminated or revoked.     Resp Syncytial Virus by PCR NEGATIVE NEGATIVE Final    Comment: (NOTE) Fact Sheet for Patients: bloggercourse.com  Fact Sheet for Healthcare Providers: seriousbroker.it  This test is not yet approved or cleared by the [place]  FDA and has been authorized for detection and/or diagnosis of SARS-CoV-2 by FDA under an Emergency Use Authorization (EUA). This EUA will remain in effect (meaning this test can be used) for the duration of the COVID-19 declaration under Section 564(b)(1) of the Act, 21 U.S.C. section 360bbb-3(b)(1), unless the authorization is terminated or revoked.  Performed at [facility], [address] [place], [place] [address]   Resp panel by RT-PCR (RSV, Flu A&B, Covid) Anterior Nasal Swab     Status: Abnormal   Collection Time: 05/21/24 12:46 AM   Specimen: Anterior Nasal Swab  Result Value Ref Range Status   SARS Coronavirus 2 by RT PCR NEGATIVE NEGATIVE Final    Comment: (NOTE) SARS-CoV-2 target nucleic acids are NOT DETECTED.  The SARS-CoV-2 RNA is generally detectable in upper respiratory specimens during the acute phase of infection. The lowest concentration of SARS-CoV-2 viral copies this assay can detect is 138 copies/mL. A negative result does not preclude SARS-Cov-2 infection and should not be used as  the sole basis for treatment or other patient management decisions. A negative result may occur with  improper specimen collection/handling, submission of specimen other than nasopharyngeal swab, presence of viral mutation(s) within the areas targeted by this assay, and inadequate number of viral copies(<138 copies/mL). A negative result must be combined with clinical observations, patient history, and epidemiological information. The expected result is Negative.  Fact Sheet for Patients:  bloggercourse.com  Fact Sheet for Healthcare Providers:  seriousbroker.it  This test is no t yet approved or cleared by the [place]  FDA and  has been authorized for detection and/or diagnosis of SARS-CoV-2 by FDA under an Emergency Use Authorization (EUA). This EUA will remain  in effect (meaning this test can be used) for the duration of the COVID-19 declaration under Section 564(b)(1) of the Act, 21 U.S.C.section 360bbb-3(b)(1), unless the authorization is terminated  or revoked sooner.       Influenza A by PCR POSITIVE (A) NEGATIVE Final   Influenza B by PCR NEGATIVE NEGATIVE Final    Comment: (NOTE) The Xpert Xpress SARS-CoV-2/FLU/RSV plus assay is intended as an aid in the diagnosis of influenza from Nasopharyngeal  swab specimens and should not be used as a sole basis for treatment. Nasal washings and aspirates are unacceptable for Xpert Xpress SARS-CoV-2/FLU/RSV testing.  Fact Sheet for Patients: bloggercourse.com  Fact Sheet for Healthcare Providers: seriousbroker.it  This test is not yet approved or cleared by the [place]  FDA and has been authorized for detection and/or diagnosis of SARS-CoV-2 by FDA under an Emergency Use Authorization (EUA). This EUA will remain in effect (meaning this test can be used) for the duration of the COVID-19 declaration under Section 564(b)(1) of the Act, 21 U.S.C. section 360bbb-3(b)(1), unless the authorization is terminated or revoked.     Resp Syncytial Virus by PCR NEGATIVE NEGATIVE Final    Comment: (NOTE) Fact Sheet for Patients: bloggercourse.com  Fact Sheet for Healthcare Providers: seriousbroker.it  This test is not yet approved or cleared by the [place]  FDA and has been authorized for detection and/or diagnosis of SARS-CoV-2 by FDA under an Emergency Use Authorization (EUA). This EUA will remain in effect (meaning this test can be used) for the duration of the COVID-19 declaration under Section 564(b)(1) of the Act, 21 U.S.C. section 360bbb-3(b)(1), unless the authorization is terminated or revoked.  Performed at [facility], [address] [place], [place] [address]          Radiology Studies: No results found.         LOS: 10 days   Time spent= 35 mins    Deliliah Room, MD [facility]  If 7PM-7AM, please contact night-coverage  05/21/2024, 9:46 AM  "

## 2024-05-21 NOTE — Progress Notes (Signed)
" ° °  Brief Progress Note   _____________________________________________________________________________________________________________  Patient Name: Hector Lowe Patient DOB: 1921/10/18 Date: 05/20/24, 2210     Data: Per 12/29 TOC note, pt's receiving hospice facility  requires 05/21/24 retest for flu prior to potential 05/21/24 discharge.     Action: No order for flu test noted. Clinical expeditor contacted RN and provider, informed them of requirement, and requested that order for flu test be placed.    Response:  Provider Dr. Manfred placed order, bedside RN [facility] aware of need to swab patient after midnight.  _____________________________________________________________________________________________________________  [facility] RN Expeditor Harlene FORBES Robert Please contact us  directly via secure chat (search for [facility]) or by calling us  at [phone] [facility]).  "

## 2024-05-22 DIAGNOSIS — E870 Hyperosmolality and hypernatremia: Secondary | ICD-10-CM | POA: Diagnosis not present

## 2024-05-22 NOTE — Plan of Care (Signed)
  Problem: Clinical Measurements: Goal: Will remain free from infection Outcome: Progressing   Problem: Clinical Measurements: Goal: Respiratory complications will improve Outcome: Progressing   Problem: Activity: Goal: Risk for activity intolerance will decrease Outcome: Progressing   

## 2024-05-22 NOTE — Plan of Care (Signed)
" °  Problem: Education: Goal: Knowledge of General Education information will improve Description: Including pain rating scale, medication(s)/side effects and non-pharmacologic comfort measures Outcome: Progressing   Problem: Health Behavior/Discharge Planning: Goal: Ability to manage health-related needs will improve Outcome: Progressing   Problem: Clinical Measurements: Goal: Ability to maintain clinical measurements within normal limits will improve Outcome: Progressing Goal: Will remain free from infection Outcome: Progressing Goal: Diagnostic test results will improve Outcome: Progressing Goal: Respiratory complications will improve Outcome: Progressing   Problem: Activity: Goal: Risk for activity intolerance will decrease Outcome: Progressing   Problem: Nutrition: Goal: Adequate nutrition will be maintained Outcome: Progressing   Problem: Coping: Goal: Level of anxiety will decrease Outcome: Progressing   Problem: Elimination: Goal: Will not experience complications related to urinary retention Outcome: Progressing   Problem: Safety: Goal: Ability to remain free from injury will improve Outcome: Progressing   Problem: Skin Integrity: Goal: Risk for impaired skin integrity will decrease Outcome: Progressing   Problem: Activity: Goal: Ability to tolerate increased activity will improve Outcome: Progressing   Problem: Clinical Measurements: Goal: Ability to maintain a body temperature in the normal range will improve Outcome: Progressing   Problem: Respiratory: Goal: Ability to maintain adequate ventilation will improve Outcome: Progressing Goal: Ability to maintain a clear airway will improve Outcome: Progressing   Problem: Education: Goal: Knowledge of disease or condition will improve Outcome: Progressing Goal: Understanding of medication regimen will improve Outcome: Progressing Goal: Individualized Educational Video(s) Outcome: Progressing    Problem: Activity: Goal: Ability to tolerate increased activity will improve Outcome: Progressing   Problem: Cardiac: Goal: Ability to achieve and maintain adequate cardiopulmonary perfusion will improve Outcome: Progressing   Problem: Education: Goal: Knowledge of the prescribed therapeutic regimen will improve Outcome: Progressing   Problem: Coping: Goal: Ability to identify and develop effective coping behavior will improve Outcome: Progressing   Problem: Clinical Measurements: Goal: Quality of life will improve Outcome: Progressing   Problem: Respiratory: Goal: Verbalizations of increased ease of respirations will increase Outcome: Progressing   Problem: Role Relationship: Goal: Family's ability to cope with current situation will improve Outcome: Progressing Goal: Ability to verbalize concerns, feelings, and thoughts to partner or family member will improve Outcome: Progressing   Problem: Pain Management: Goal: Satisfaction with pain management regimen will improve Outcome: Progressing   "

## 2024-05-22 NOTE — Progress Notes (Signed)
 " [name]  FMW:[id number] DOB: Dec 06, 1921 DOA: 05/11/2024 PCP: [name], MD   Brief Narrative:   89 y.o. male with medical history significant for CKD 3, hypertension, gout. Patient was brought to the ED via EMS with reports of 1 week of progressive generalized weakness, sore throat, cough, nausea and vomiting and lower abdominal pain.  Over the past week he has barely had any oral intake, he has had vomiting with every oral intake, and has not been able to stay hydrated.  Patient was admitted with acute hypoxemic respiratory failure secondary to influenza A infection with likely superimposed pneumonia.  He also has new onset atrial fibrillation with RVR.  He currently continues to have significant amounts of weakness and poor oral intake with hypernatremia.  Transitioned to full comfort care on 05/18/24. Residential hospice/[facility] unable to take patient due to positive flu test   12/31: Medical director at [facility] said they will reassess on Friday   Assessment & Plan:  Principal Problem:   Hypernatremia Active Problems:   Influenza A   Acute hypoxic respiratory failure (HCC)   Atrial fibrillation with RVR (HCC)   -influenza A infection with presumed superimposed pneumonia - Treatment with Tamiflu  completed -Completed 5 days of appropriate antibiotics for pneumonia --At family request transitioned patient to full comfort care on 05/18/2024 -- continues to be unable to eat enough to sustain himself -Patient without fevers or any other influenza symptoms for several days now -Repeat testing done on 12/31 is positive for Flu A   Acute hypoxic respiratory failure - Secondary to above -Hypoxia has resolved patient has been weaned off oxygen completely - Comfort care    New onset atrial fibrillation with RVR:  - 2D echo demonstrating preserved ejection fraction and no significant valvular disorder. -- Comfort care   Chronic kidney disease stage  IIIb - Appears to be close to his baseline,  -- On comfort care   Hypertension   Severe protein calorie malnutrition -Body mass index is 14.68 kg/m.  -On comfort care now  Hypernatremia - Resolved   weakness/deconditioning/anorexia -- Comfort care now   Disposition: Hospice residential facility   DVT prophylaxis: Not indicated     Code Status: Do not attempt resuscitation (DNR) - Comfort care Family Communication:  None at the bedside Status is: Inpatient Remains inpatient appropriate because: on comfort care    Subjective:  No acute events overnight.  He is alert and awake this morning and is communicative.  He is complaining of decreased strength in his lower extremities. He also told me that his family will be visiting him today.  Examination:  General exam: Alert and awake Respiratory system: Clear to auscultation. Respiratory effort normal. Cardiovascular system: S1 & S2 heard, RRR. No JVD, murmurs, rubs, gallops or clicks. No pedal edema. Gastrointestinal system: Abdomen is nondistended, soft and nontender. No organomegaly or masses felt. Normal bowel sounds heard. Central nervous system: Alert and awake, no focal neurological deficits Extremities: Equal power bilaterally Skin: No rashes, lesions or ulcers     Diet Orders (From admission, onward)     Start     Ordered   05/13/24 1449  DIET - DYS 1 Room service appropriate? Yes; Fluid consistency: Thin  Diet effective now       Question Answer Comment  Room service appropriate? Yes   Fluid consistency: Thin      05/13/24 1449            Objective:  Vitals:   05/19/24 2045 05/20/24 1406 05/21/24 1354 05/22/24 0420  BP: (!) 142/95 129/84 128/86 (!) 124/93  Pulse: (!) 109 (!) 102 86 94  Resp:  18 17 14   Temp: 98.2 F (36.8 C) 98 F (36.7 C) 98.1 F (36.7 C) (!) 97.5 F (36.4 C)  TempSrc: Oral Oral Oral Oral  SpO2:  97% 97% 98%  Weight:      Height:        Intake/Output Summary (Last 24  hours) at 05/22/2024 0829 Last data filed at 05/21/2024 1106 Gross per 24 hour  Intake 120 ml  Output --  Net 120 ml   Filed Weights   05/11/24 1710  Weight: 46.4 kg    Scheduled Meds:  aspirin   81 mg Oral Daily   feeding supplement  237 mL Oral BID BM   magic mouthwash w/lidocaine   5 mL Oral TID AC   metoprolol  tartrate  25 mg Oral BID   mouth rinse  15 mL Mouth Rinse 4 times per day   Continuous Infusions:  Nutritional status     Body mass index is 14.68 kg/m.  Data Reviewed:   CBC: Recent Labs  Lab 05/16/24 0441  WBC 8.1  HGB 12.0*  HCT 39.8  MCV 100.5*  PLT 151   Basic Metabolic Panel: Recent Labs  Lab 05/16/24 0441  NA 143  K 4.1  CL 110  CO2 22  GLUCOSE 74  BUN 41*  CREATININE 1.66*  CALCIUM  8.6*  MG 2.3   GFR: Estimated Creatinine Clearance: 15.5 mL/min (A) (by C-G formula based on SCr of 1.66 mg/dL (H)). Liver Function Tests: No results for input(s): AST, ALT, ALKPHOS, BILITOT, PROT, ALBUMIN in the last 168 hours. No results for input(s): LIPASE, AMYLASE in the last 168 hours. No results for input(s): AMMONIA in the last 168 hours. Coagulation Profile: No results for input(s): INR, PROTIME in the last 168 hours. Cardiac Enzymes: No results for input(s): CKTOTAL, CKMB, CKMBINDEX, TROPONINI in the last 168 hours. BNP (last 3 results) No results for input(s): PROBNP in the last 8760 hours. HbA1C: No results for input(s): HGBA1C in the last 72 hours. CBG: Recent Labs  Lab 05/18/24 0421 05/18/24 0741 05/18/24 0831 05/18/24 2230 05/19/24 2031  GLUCAP 78 64* 88 90 108*   Lipid Profile: No results for input(s): CHOL, HDL, LDLCALC, TRIG, CHOLHDL, LDLDIRECT in the last 72 hours. Thyroid  Function Tests: No results for input(s): TSH, T4TOTAL, FREET4, T3FREE, THYROIDAB in the last 72 hours. Anemia Panel: No results for input(s): VITAMINB12, FOLATE, FERRITIN, TIBC, IRON,  RETICCTPCT in the last 72 hours. Sepsis Labs: Recent Labs  Lab 05/16/24 0441  LATICACIDVEN 2.3*    Recent Results (from the past 240 hours)  Resp panel by RT-PCR (RSV, Flu A&B, Covid) Anterior Nasal Swab     Status: Abnormal   Collection Time: 05/19/24 11:46 AM   Specimen: Anterior Nasal Swab  Result Value Ref Range Status   SARS Coronavirus 2 by RT PCR NEGATIVE NEGATIVE Final    Comment: (NOTE) SARS-CoV-2 target nucleic acids are NOT DETECTED.  The SARS-CoV-2 RNA is generally detectable in upper respiratory specimens during the acute phase of infection. The lowest concentration of SARS-CoV-2 viral copies this assay can detect is 138 copies/mL. A negative result does not preclude SARS-Cov-2 infection and should not be used as the sole basis for treatment or other patient management decisions. A negative result may occur with  improper specimen collection/handling, submission of specimen other than nasopharyngeal swab, presence of  viral mutation(s) within the areas targeted by this assay, and inadequate number of viral copies(<138 copies/mL). A negative result must be combined with clinical observations, patient history, and epidemiological information. The expected result is Negative.  Fact Sheet for Patients:  bloggercourse.com  Fact Sheet for Healthcare Providers:  seriousbroker.it  This test is no t yet approved or cleared by the [place]  FDA and  has been authorized for detection and/or diagnosis of SARS-CoV-2 by FDA under an Emergency Use Authorization (EUA). This EUA will remain  in effect (meaning this test can be used) for the duration of the COVID-19 declaration under Section 564(b)(1) of the Act, 21 U.S.C.section 360bbb-3(b)(1), unless the authorization is terminated  or revoked sooner.       Influenza A by PCR POSITIVE (A) NEGATIVE Final   Influenza B by PCR NEGATIVE NEGATIVE Final    Comment:  (NOTE) The Xpert Xpress SARS-CoV-2/FLU/RSV plus assay is intended as an aid in the diagnosis of influenza from Nasopharyngeal swab specimens and should not be used as a sole basis for treatment. Nasal washings and aspirates are unacceptable for Xpert Xpress SARS-CoV-2/FLU/RSV testing.  Fact Sheet for Patients: bloggercourse.com  Fact Sheet for Healthcare Providers: seriousbroker.it  This test is not yet approved or cleared by the [place]  FDA and has been authorized for detection and/or diagnosis of SARS-CoV-2 by FDA under an Emergency Use Authorization (EUA). This EUA will remain in effect (meaning this test can be used) for the duration of the COVID-19 declaration under Section 564(b)(1) of the Act, 21 U.S.C. section 360bbb-3(b)(1), unless the authorization is terminated or revoked.     Resp Syncytial Virus by PCR NEGATIVE NEGATIVE Final    Comment: (NOTE) Fact Sheet for Patients: bloggercourse.com  Fact Sheet for Healthcare Providers: seriousbroker.it  This test is not yet approved or cleared by the [place]  FDA and has been authorized for detection and/or diagnosis of SARS-CoV-2 by FDA under an Emergency Use Authorization (EUA). This EUA will remain in effect (meaning this test can be used) for the duration of the COVID-19 declaration under Section 564(b)(1) of the Act, 21 U.S.C. section 360bbb-3(b)(1), unless the authorization is terminated or revoked.  Performed at [facility], [address] [place], [place] [address]   Resp panel by RT-PCR (RSV, Flu A&B, Covid) Anterior Nasal Swab     Status: Abnormal   Collection Time: 05/21/24 12:46 AM   Specimen: Anterior Nasal Swab  Result Value Ref Range Status   SARS Coronavirus 2 by RT PCR NEGATIVE NEGATIVE Final    Comment: (NOTE) SARS-CoV-2 target nucleic acids are NOT DETECTED.  The SARS-CoV-2 RNA is generally  detectable in upper respiratory specimens during the acute phase of infection. The lowest concentration of SARS-CoV-2 viral copies this assay can detect is 138 copies/mL. A negative result does not preclude SARS-Cov-2 infection and should not be used as the sole basis for treatment or other patient management decisions. A negative result may occur with  improper specimen collection/handling, submission of specimen other than nasopharyngeal swab, presence of viral mutation(s) within the areas targeted by this assay, and inadequate number of viral copies(<138 copies/mL). A negative result must be combined with clinical observations, patient history, and epidemiological information. The expected result is Negative.  Fact Sheet for Patients:  bloggercourse.com  Fact Sheet for Healthcare Providers:  seriousbroker.it  This test is no t yet approved or cleared by the [place]  FDA and  has been authorized for detection and/or diagnosis of SARS-CoV-2 by FDA under an Emergency  Use Authorization (EUA). This EUA will remain  in effect (meaning this test can be used) for the duration of the COVID-19 declaration under Section 564(b)(1) of the Act, 21 U.S.C.section 360bbb-3(b)(1), unless the authorization is terminated  or revoked sooner.       Influenza A by PCR POSITIVE (A) NEGATIVE Final   Influenza B by PCR NEGATIVE NEGATIVE Final    Comment: (NOTE) The Xpert Xpress SARS-CoV-2/FLU/RSV plus assay is intended as an aid in the diagnosis of influenza from Nasopharyngeal swab specimens and should not be used as a sole basis for treatment. Nasal washings and aspirates are unacceptable for Xpert Xpress SARS-CoV-2/FLU/RSV testing.  Fact Sheet for Patients: bloggercourse.com  Fact Sheet for Healthcare Providers: seriousbroker.it  This test is not yet approved or cleared by the [place]   FDA and has been authorized for detection and/or diagnosis of SARS-CoV-2 by FDA under an Emergency Use Authorization (EUA). This EUA will remain in effect (meaning this test can be used) for the duration of the COVID-19 declaration under Section 564(b)(1) of the Act, 21 U.S.C. section 360bbb-3(b)(1), unless the authorization is terminated or revoked.     Resp Syncytial Virus by PCR NEGATIVE NEGATIVE Final    Comment: (NOTE) Fact Sheet for Patients: bloggercourse.com  Fact Sheet for Healthcare Providers: seriousbroker.it  This test is not yet approved or cleared by the [place]  FDA and has been authorized for detection and/or diagnosis of SARS-CoV-2 by FDA under an Emergency Use Authorization (EUA). This EUA will remain in effect (meaning this test can be used) for the duration of the COVID-19 declaration under Section 564(b)(1) of the Act, 21 U.S.C. section 360bbb-3(b)(1), unless the authorization is terminated or revoked.  Performed at [facility], [address] [place], [place] [address]          Radiology Studies: No results found.         LOS: 11 days   Time spent= 35 mins    Deliliah Room, MD [facility]  If 7PM-7AM, please contact night-coverage  05/22/2024, 8:29 AM  "

## 2024-05-23 DIAGNOSIS — Z7189 Other specified counseling: Secondary | ICD-10-CM | POA: Diagnosis not present

## 2024-05-23 DIAGNOSIS — Z515 Encounter for palliative care: Secondary | ICD-10-CM

## 2024-05-23 DIAGNOSIS — E870 Hyperosmolality and hypernatremia: Secondary | ICD-10-CM | POA: Diagnosis not present

## 2024-05-23 DIAGNOSIS — J101 Influenza due to other identified influenza virus with other respiratory manifestations: Secondary | ICD-10-CM | POA: Diagnosis not present

## 2024-05-23 DIAGNOSIS — R627 Adult failure to thrive: Secondary | ICD-10-CM | POA: Diagnosis not present

## 2024-05-23 LAB — BASIC METABOLIC PANEL WITH GFR
Anion gap: 15 (ref 5–15)
BUN: 30 mg/dL — ABNORMAL HIGH (ref 8–23)
CO2: 19 mmol/L — ABNORMAL LOW (ref 22–32)
Calcium: 8.6 mg/dL — ABNORMAL LOW (ref 8.9–10.3)
Chloride: 112 mmol/L — ABNORMAL HIGH (ref 98–111)
Creatinine, Ser: 1.48 mg/dL — ABNORMAL HIGH (ref 0.61–1.24)
GFR, Estimated: 42 mL/min — ABNORMAL LOW
Glucose, Bld: 104 mg/dL — ABNORMAL HIGH (ref 70–99)
Potassium: 4.2 mmol/L (ref 3.5–5.1)
Sodium: 145 mmol/L (ref 135–145)

## 2024-05-23 LAB — CBC WITH DIFFERENTIAL/PLATELET
Abs Immature Granulocytes: 0.03 K/uL (ref 0.00–0.07)
Basophils Absolute: 0 K/uL (ref 0.0–0.1)
Basophils Relative: 1 %
Eosinophils Absolute: 0 K/uL (ref 0.0–0.5)
Eosinophils Relative: 0 %
HCT: 40.2 % (ref 39.0–52.0)
Hemoglobin: 12.5 g/dL — ABNORMAL LOW (ref 13.0–17.0)
Immature Granulocytes: 0 %
Lymphocytes Relative: 14 %
Lymphs Abs: 1 K/uL (ref 0.7–4.0)
MCH: 30.9 pg (ref 26.0–34.0)
MCHC: 31.1 g/dL (ref 30.0–36.0)
MCV: 99.3 fL (ref 80.0–100.0)
Monocytes Absolute: 0.7 K/uL (ref 0.1–1.0)
Monocytes Relative: 9 %
Neutro Abs: 5.9 K/uL (ref 1.7–7.7)
Neutrophils Relative %: 76 %
Platelets: 260 K/uL (ref 150–400)
RBC: 4.05 MIL/uL — ABNORMAL LOW (ref 4.22–5.81)
RDW: 17.3 % — ABNORMAL HIGH (ref 11.5–15.5)
WBC: 7.7 K/uL (ref 4.0–10.5)
nRBC: 0 % (ref 0.0–0.2)

## 2024-05-23 LAB — RESP PANEL BY RT-PCR (RSV, FLU A&B, COVID)  RVPGX2
Influenza A by PCR: POSITIVE — AB
Influenza B by PCR: NEGATIVE
Resp Syncytial Virus by PCR: NEGATIVE
SARS Coronavirus 2 by RT PCR: NEGATIVE

## 2024-05-23 NOTE — Progress Notes (Signed)
 Palliative:  HPI: 89 y.o. male  with past medical history of CKD 3, hypertension, gout. Patient was brought to the ED via EMS with reports of 1 week of progressive generalized weakness, sore throat, cough, nausea and vomiting and lower abdominal pain. He was admitted on 05/11/2024 with influenza A with presumed superimposed pneumonia, acute hypoxic respiratory failure, new onset A-fib with RVR at time of admission, CKD 3B, severe protein calorie malnutrition (BMI 14.68), weakness, deconditioning, anorexia, and others. Hospitalization complicated by poor intake and weakness. Failure to thrive. Family have agreed with comfort measures recognizing that he is approaching end of life.   I went to Hector Lowe's bedside. He is sleeping and does not awaken when I enter room. No family present. I discussed with RN. I discussed with [facility]. I called son, Hector Lowe and I discussed his father's progression and his conversation with hospice. Lowe shares that he sees gradual decline each day. He reports that he and his wife try and feed Hector Lowe but he only takes a few sips and a couple bites of applesauce. He reports that he coughs and struggles with fluids - likely aspirating. Lowe shares that he sees that his father is at end of life and just wants to ensure that his father has good care and dignity at end of life. He is disappointed that hospice said he is not appropriate to come to their facility. 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 He was still living independently prior to admission. Lowe shares that he also does not believe his father could endure rehab or is a candidate and he does not want to put him through that at this stage of life. I agree with Lowe. I shared that sometimes we have to just give it a little time and we can reassess with hospice again as time goes on. Lowe agrees that he feels  his father will continue to decline.   I discussed with [facility] [place] along with hospice liaison who says they will re-evaluate Monday. Discussed also with Dr. Dino who agrees with plan.   Exam: Resting comfortably. Did not awaken.   Plan: - DNR - Continue comfort measures - Hospice to reassess for hospice facility on Monday  50 min  Bernarda Kitty, NP Palliative Medicine Team Pager [phone] (Please see [email or web address] for schedule) Team Phone [phone]

## 2024-05-23 NOTE — Progress Notes (Signed)
 No acute events overnight. Patient remained alert and oriented x 4. He denies pain. He is pleasant. 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 He is able to tell this RN what he needs and specifically requests pancakes for breakfast. Bertina Solum RN

## 2024-05-23 NOTE — TOC Progression Note (Signed)
 Transition of Care [facility]) - Progression Note    Patient Details  Name: Hector Lowe MRN: [id number] Date of Birth: [birth year]  [facility] CM/SW Contact  Sharlyne Stabs, RN Phone Number: 05/23/2024, 3:40 PM  Clinical Narrative:   [facility] hospice nurses assessed. Still positive for flu. They declined offering a hospice bed, felt patient has improved and eating and drinking.  Palliative NP and CM called Hospice. Patient has taken in a limited amount of food and fluids, likely to decline in next couple of days. Family can not take him home. They work and he was independent before admission. Beth, hospice RN is agreeable to reassess on Monday. Team updated.     Expected Discharge Plan: Hospice Medical Facility Barriers to Discharge: Other (must enter comment) (Due to Flu waiting on Hospice to accept)    Expected Discharge Plan and Services In-house Referral: Clinical Social Work Discharge Planning Services: CM Consult Post Acute Care Choice: Durable Medical Equipment Living arrangements for the past 2 months: Single Family Home Expected Discharge Date: 05/19/24               DME Arranged: Oxygen DME Agency: [employer] Date DME Agency Contacted: 05/13/24 Time DME Agency Contacted: 1525 Representative spoke with at DME Agency: Darlyn    Social Drivers of Health (SDOH) Interventions SDOH Screenings   Food Insecurity: Patient Unable To Answer (05/11/2024)  Housing: Unknown (05/11/2024)  Transportation Needs: Patient Unable To Answer (05/11/2024)  Utilities: Patient Unable To Answer (05/11/2024)  Social Connections: Patient Unable To Answer (05/11/2024)  Tobacco Use: High Risk (05/11/2024)   Readmission Risk Interventions    05/14/2024   12:50 PM  Readmission Risk Prevention Plan  Transportation Screening Complete  Home Care Screening Complete  Medication Review (RN CM) Complete

## 2024-05-23 NOTE — Plan of Care (Signed)
" °  Problem: Education: Goal: Knowledge of General Education information will improve Description: Including pain rating scale, medication(s)/side effects and non-pharmacologic comfort measures Outcome: Progressing   Problem: Health Behavior/Discharge Planning: Goal: Ability to manage health-related needs will improve Outcome: Progressing   Problem: Clinical Measurements: Goal: Ability to maintain clinical measurements within normal limits will improve Outcome: Progressing Goal: Will remain free from infection Outcome: Progressing Goal: Diagnostic test results will improve Outcome: Progressing Goal: Respiratory complications will improve Outcome: Progressing   Problem: Activity: Goal: Risk for activity intolerance will decrease Outcome: Progressing   Problem: Nutrition: Goal: Adequate nutrition will be maintained Outcome: Progressing   Problem: Coping: Goal: Level of anxiety will decrease Outcome: Progressing   Problem: Elimination: Goal: Will not experience complications related to urinary retention Outcome: Progressing   Problem: Safety: Goal: Ability to remain free from injury will improve Outcome: Progressing   Problem: Skin Integrity: Goal: Risk for impaired skin integrity will decrease Outcome: Progressing   Problem: Activity: Goal: Ability to tolerate increased activity will improve Outcome: Progressing   Problem: Clinical Measurements: Goal: Ability to maintain a body temperature in the normal range will improve Outcome: Progressing   Problem: Respiratory: Goal: Ability to maintain adequate ventilation will improve Outcome: Progressing Goal: Ability to maintain a clear airway will improve Outcome: Progressing   Problem: Education: Goal: Knowledge of disease or condition will improve Outcome: Progressing Goal: Understanding of medication regimen will improve Outcome: Progressing Goal: Individualized Educational Video(s) Outcome: Progressing    Problem: Activity: Goal: Ability to tolerate increased activity will improve Outcome: Progressing   Problem: Cardiac: Goal: Ability to achieve and maintain adequate cardiopulmonary perfusion will improve Outcome: Progressing   Problem: Education: Goal: Knowledge of the prescribed therapeutic regimen will improve Outcome: Progressing   Problem: Coping: Goal: Ability to identify and develop effective coping behavior will improve Outcome: Progressing   Problem: Clinical Measurements: Goal: Quality of life will improve Outcome: Progressing   Problem: Respiratory: Goal: Verbalizations of increased ease of respirations will increase Outcome: Progressing   Problem: Role Relationship: Goal: Family's ability to cope with current situation will improve Outcome: Progressing Goal: Ability to verbalize concerns, feelings, and thoughts to partner or family member will improve Outcome: Progressing   Problem: Pain Management: Goal: Satisfaction with pain management regimen will improve Outcome: Progressing   "

## 2024-05-23 NOTE — Progress Notes (Addendum)
 " [name]  FMW:[id number] DOB: [birth year] DOA: 05/11/2024 PCP: [name], MD   Brief Narrative:   89 y.o. male with medical history significant for CKD 3, hypertension, gout. Patient was brought to the ED via EMS with reports of 1 week of progressive generalized weakness, sore throat, cough, nausea and vomiting and lower abdominal pain.  Over the past week he has barely had any oral intake, he has had vomiting with every oral intake, and has not been able to stay hydrated.  Patient was admitted with acute hypoxemic respiratory failure secondary to influenza A infection with likely superimposed pneumonia.  He also has new onset atrial fibrillation with RVR.  He currently continues to have significant amounts of weakness and poor oral intake with hypernatremia.  Transitioned to full comfort care on 05/18/24. Residential hospice/[facility] unable to take patient due to positive flu test   Medical director at [facility] denied residential hospice. Family can't take him home.   Assessment & Plan:  Principal Problem:   Hypernatremia Active Problems:   Influenza A   Acute hypoxic respiratory failure (HCC)   Atrial fibrillation with RVR (HCC)   -influenza A infection with presumed superimposed pneumonia - Treatment with Tamiflu  completed -Completed 5 days of appropriate antibiotics for pneumonia --At family request transitioned patient to full comfort care on 05/18/2024 -- continues to be unable to eat enough to sustain himself -Patient without fevers or any other influenza symptoms for several days now -Repeat testing done on 12/31 is positive for Flu A   Acute hypoxic respiratory failure - Secondary to above -Hypoxia has resolved patient has been weaned off oxygen completely - Comfort care    New onset atrial fibrillation with RVR:  - 2D echo demonstrating preserved ejection fraction and no significant valvular disorder. -- Comfort care   Chronic  kidney disease stage IIIb - Appears to be close to his baseline,  -- On comfort care   Hypertension   Severe protein calorie malnutrition -Body mass index is 14.68 kg/m.  -On comfort care now  Hypernatremia - Resolved   weakness/deconditioning/anorexia -- Comfort care now   Disposition: Hospice residential facility   DVT prophylaxis: Not indicated     Code Status: Do not attempt resuscitation (DNR) - Comfort care Family Communication:  None at the bedside Status is: Inpatient Remains inpatient appropriate because: on comfort care    Subjective:  No acute events overnight.  He is alert and awake. Denies any active complaints. Electronics engineer will come and reassess him today.  Examination:  General exam: Alert and awake Respiratory system: Clear to auscultation. Respiratory effort normal. Cardiovascular system: S1 & S2 heard, RRR. No JVD, murmurs, rubs, gallops or clicks. No pedal edema. Gastrointestinal system: Abdomen is nondistended, soft and nontender. No organomegaly or masses felt. Normal bowel sounds heard. Central nervous system: Alert and awake, no focal neurological deficits Extremities: Equal power bilaterally Skin: No rashes, lesions or ulcers     Diet Orders (From admission, onward)     Start     Ordered   05/13/24 1449  DIET - DYS 1 Room service appropriate? Yes; Fluid consistency: Thin  Diet effective now       Question Answer Comment  Room service appropriate? Yes   Fluid consistency: Thin      05/13/24 1449            Objective: Vitals:   05/22/24 0420 05/22/24 1406 05/22/24 2016 05/23/24 0807  BP: ROLLEN)  124/93 (!) 134/94 105/76 111/82  Pulse: 94 87 82 (!) 105  Resp: 14 18 14 16   Temp: (!) 97.5 F (36.4 C) 97.6 F (36.4 C) (!) 97.4 F (36.3 C) 97.7 F (36.5 C)  TempSrc: Oral Oral Axillary Oral  SpO2: 98% 97%  98%  Weight:      Height:        Intake/Output Summary (Last 24 hours) at 05/23/2024 0937 Last data filed at  05/23/2024 9147 Gross per 24 hour  Intake 240 ml  Output 150 ml  Net 90 ml   Filed Weights   05/11/24 1710  Weight: 46.4 kg    Scheduled Meds:  aspirin   81 mg Oral Daily   feeding supplement  237 mL Oral BID BM   magic mouthwash w/lidocaine   5 mL Oral TID AC   metoprolol  tartrate  25 mg Oral BID   mouth rinse  15 mL Mouth Rinse 4 times per day   Continuous Infusions:  Nutritional status     Body mass index is 14.68 kg/m.  Data Reviewed:   CBC: No results for input(s): WBC, NEUTROABS, HGB, HCT, MCV, PLT in the last 168 hours.  Basic Metabolic Panel: No results for input(s): NA, K, CL, CO2, GLUCOSE, BUN, CREATININE, CALCIUM , MG, PHOS in the last 168 hours.  GFR: Estimated Creatinine Clearance: 15.5 mL/min (A) (by C-G formula based on SCr of 1.66 mg/dL (H)). Liver Function Tests: No results for input(s): AST, ALT, ALKPHOS, BILITOT, PROT, ALBUMIN in the last 168 hours. No results for input(s): LIPASE, AMYLASE in the last 168 hours. No results for input(s): AMMONIA in the last 168 hours. Coagulation Profile: No results for input(s): INR, PROTIME in the last 168 hours. Cardiac Enzymes: No results for input(s): CKTOTAL, CKMB, CKMBINDEX, TROPONINI in the last 168 hours. BNP (last 3 results) No results for input(s): PROBNP in the last 8760 hours. HbA1C: No results for input(s): HGBA1C in the last 72 hours. CBG: Recent Labs  Lab 05/18/24 0421 05/18/24 0741 05/18/24 0831 05/18/24 2230 05/19/24 2031  GLUCAP 78 64* 88 90 108*   Lipid Profile: No results for input(s): CHOL, HDL, LDLCALC, TRIG, CHOLHDL, LDLDIRECT in the last 72 hours. Thyroid  Function Tests: No results for input(s): TSH, T4TOTAL, FREET4, T3FREE, THYROIDAB in the last 72 hours. Anemia Panel: No results for input(s): VITAMINB12, FOLATE, FERRITIN, TIBC, IRON, RETICCTPCT in the last 72 hours. Sepsis Labs: No  results for input(s): PROCALCITON, LATICACIDVEN in the last 168 hours.   Recent Results (from the past 240 hours)  Resp panel by RT-PCR (RSV, Flu A&B, Covid) Anterior Nasal Swab     Status: Abnormal   Collection Time: 05/19/24 11:46 AM   Specimen: Anterior Nasal Swab  Result Value Ref Range Status   SARS Coronavirus 2 by RT PCR NEGATIVE NEGATIVE Final    Comment: (NOTE) SARS-CoV-2 target nucleic acids are NOT DETECTED.  The SARS-CoV-2 RNA is generally detectable in upper respiratory specimens during the acute phase of infection. The lowest concentration of SARS-CoV-2 viral copies this assay can detect is 138 copies/mL. A negative result does not preclude SARS-Cov-2 infection and should not be used as the sole basis for treatment or other patient management decisions. A negative result may occur with  improper specimen collection/handling, submission of specimen other than nasopharyngeal swab, presence of viral mutation(s) within the areas targeted by this assay, and inadequate number of viral copies(<138 copies/mL). A negative result must be combined with clinical observations, patient history, and epidemiological information. The expected result is  Negative.  Fact Sheet for Patients:  bloggercourse.com  Fact Sheet for Healthcare Providers:  seriousbroker.it  This test is no t yet approved or cleared by the [place]  FDA and  has been authorized for detection and/or diagnosis of SARS-CoV-2 by FDA under an Emergency Use Authorization (EUA). This EUA will remain  in effect (meaning this test can be used) for the duration of the COVID-19 declaration under Section 564(b)(1) of the Act, 21 U.S.C.section 360bbb-3(b)(1), unless the authorization is terminated  or revoked sooner.       Influenza A by PCR POSITIVE (A) NEGATIVE Final   Influenza B by PCR NEGATIVE NEGATIVE Final    Comment: (NOTE) The Xpert Xpress  SARS-CoV-2/FLU/RSV plus assay is intended as an aid in the diagnosis of influenza from Nasopharyngeal swab specimens and should not be used as a sole basis for treatment. Nasal washings and aspirates are unacceptable for Xpert Xpress SARS-CoV-2/FLU/RSV testing.  Fact Sheet for Patients: bloggercourse.com  Fact Sheet for Healthcare Providers: seriousbroker.it  This test is not yet approved or cleared by the [place]  FDA and has been authorized for detection and/or diagnosis of SARS-CoV-2 by FDA under an Emergency Use Authorization (EUA). This EUA will remain in effect (meaning this test can be used) for the duration of the COVID-19 declaration under Section 564(b)(1) of the Act, 21 U.S.C. section 360bbb-3(b)(1), unless the authorization is terminated or revoked.     Resp Syncytial Virus by PCR NEGATIVE NEGATIVE Final    Comment: (NOTE) Fact Sheet for Patients: bloggercourse.com  Fact Sheet for Healthcare Providers: seriousbroker.it  This test is not yet approved or cleared by the [place]  FDA and has been authorized for detection and/or diagnosis of SARS-CoV-2 by FDA under an Emergency Use Authorization (EUA). This EUA will remain in effect (meaning this test can be used) for the duration of the COVID-19 declaration under Section 564(b)(1) of the Act, 21 U.S.C. section 360bbb-3(b)(1), unless the authorization is terminated or revoked.  Performed at [facility], [address], [place], [place] [address]   Resp panel by RT-PCR (RSV, Flu A&B, Covid) Anterior Nasal Swab     Status: Abnormal   Collection Time: 05/21/24 12:46 AM   Specimen: Anterior Nasal Swab  Result Value Ref Range Status   SARS Coronavirus 2 by RT PCR NEGATIVE NEGATIVE Final    Comment: (NOTE) SARS-CoV-2 target nucleic acids are NOT DETECTED.  The SARS-CoV-2 RNA is generally detectable in upper  respiratory specimens during the acute phase of infection. The lowest concentration of SARS-CoV-2 viral copies this assay can detect is 138 copies/mL. A negative result does not preclude SARS-Cov-2 infection and should not be used as the sole basis for treatment or other patient management decisions. A negative result may occur with  improper specimen collection/handling, submission of specimen other than nasopharyngeal swab, presence of viral mutation(s) within the areas targeted by this assay, and inadequate number of viral copies(<138 copies/mL). A negative result must be combined with clinical observations, patient history, and epidemiological information. The expected result is Negative.  Fact Sheet for Patients:  bloggercourse.com  Fact Sheet for Healthcare Providers:  seriousbroker.it  This test is no t yet approved or cleared by the [place]  FDA and  has been authorized for detection and/or diagnosis of SARS-CoV-2 by FDA under an Emergency Use Authorization (EUA). This EUA will remain  in effect (meaning this test can be used) for the duration of the COVID-19 declaration under Section 564(b)(1) of the Act, 21 U.S.C.section 360bbb-3(b)(1), unless the  authorization is terminated  or revoked sooner.       Influenza A by PCR POSITIVE (A) NEGATIVE Final   Influenza B by PCR NEGATIVE NEGATIVE Final    Comment: (NOTE) The Xpert Xpress SARS-CoV-2/FLU/RSV plus assay is intended as an aid in the diagnosis of influenza from Nasopharyngeal swab specimens and should not be used as a sole basis for treatment. Nasal washings and aspirates are unacceptable for Xpert Xpress SARS-CoV-2/FLU/RSV testing.  Fact Sheet for Patients: bloggercourse.com  Fact Sheet for Healthcare Providers: seriousbroker.it  This test is not yet approved or cleared by the [place]  FDA and has been  authorized for detection and/or diagnosis of SARS-CoV-2 by FDA under an Emergency Use Authorization (EUA). This EUA will remain in effect (meaning this test can be used) for the duration of the COVID-19 declaration under Section 564(b)(1) of the Act, 21 U.S.C. section 360bbb-3(b)(1), unless the authorization is terminated or revoked.     Resp Syncytial Virus by PCR NEGATIVE NEGATIVE Final    Comment: (NOTE) Fact Sheet for Patients: bloggercourse.com  Fact Sheet for Healthcare Providers: seriousbroker.it  This test is not yet approved or cleared by the [place]  FDA and has been authorized for detection and/or diagnosis of SARS-CoV-2 by FDA under an Emergency Use Authorization (EUA). This EUA will remain in effect (meaning this test can be used) for the duration of the COVID-19 declaration under Section 564(b)(1) of the Act, 21 U.S.C. section 360bbb-3(b)(1), unless the authorization is terminated or revoked.  Performed at [facility], [address], [place], [place] [address]          Radiology Studies: No results found.         LOS: 12 days   Time spent= 35 mins    Deliliah Room, MD [facility]  If 7PM-7AM, please contact night-coverage  05/23/2024, 9:37 AM  "

## 2024-05-24 DIAGNOSIS — E870 Hyperosmolality and hypernatremia: Secondary | ICD-10-CM | POA: Diagnosis not present

## 2024-05-24 NOTE — Plan of Care (Signed)
 " Problem: Education: Goal: Knowledge of General Education information will improve Description: Including pain rating scale, medication(s)/side effects and non-pharmacologic comfort measures Outcome: Progressing   Problem: Health Behavior/Discharge Planning: Goal: Ability to manage health-related needs will improve Outcome: Progressing   Problem: Clinical Measurements: Goal: Ability to maintain clinical measurements within normal limits will improve Outcome: Progressing Goal: Will remain free from infection Outcome: Progressing Goal: Diagnostic test results will improve Outcome: Progressing Goal: Respiratory complications will improve Outcome: Progressing   Problem: Activity: Goal: Risk for activity intolerance will decrease Outcome: Progressing   Problem: Nutrition: Goal: Adequate nutrition will be maintained Outcome: Progressing   Problem: Coping: Goal: Level of anxiety will decrease Outcome: Progressing   Problem: Elimination: Goal: Will not experience complications related to urinary retention Outcome: Progressing   Problem: Safety: Goal: Ability to remain free from injury will improve Outcome: Progressing   Problem: Skin Integrity: Goal: Risk for impaired skin integrity will decrease Outcome: Progressing   Problem: Activity: Goal: Ability to tolerate increased activity will improve Outcome: Progressing   Problem: Clinical Measurements: Goal: Ability to maintain a body temperature in the normal range will improve Outcome: Progressing   Problem: Respiratory: Goal: Ability to maintain adequate ventilation will improve Outcome: Progressing Goal: Ability to maintain a clear airway will improve Outcome: Progressing   Problem: Education: Goal: Knowledge of disease or condition will improve Outcome: Progressing Goal: Understanding of medication regimen will improve Outcome: Progressing Goal: Individualized Educational Video(s) Outcome: Progressing    Problem: Activity: Goal: Ability to tolerate increased activity will improve Outcome: Progressing   Problem: Cardiac: Goal: Ability to achieve and maintain adequate cardiopulmonary perfusion will improve Outcome: Progressing   Problem: Education: Goal: Knowledge of the prescribed therapeutic regimen will improve Outcome: Progressing   Problem: Coping: Goal: Ability to identify and develop effective coping behavior will improve Outcome: Progressing   Problem: Clinical Measurements: Goal: Quality of life will improve Outcome: Progressing   Problem: Respiratory: Goal: Verbalizations of increased ease of respirations will increase Outcome: Progressing   Problem: Role Relationship: Goal: Family's ability to cope with current situation will improve Outcome: Progressing Goal: Ability to verbalize concerns, feelings, and thoughts to partner or family member will improve Outcome: Progressing   Problem: Pain Management: Goal: Satisfaction with pain management regimen will improve Outcome: Progressing   Problem: Education: Goal: Knowledge of General Education information will improve Description: Including pain rating scale, medication(s)/side effects and non-pharmacologic comfort measures Outcome: Progressing   Problem: Health Behavior/Discharge Planning: Goal: Ability to manage health-related needs will improve Outcome: Progressing   Problem: Nutrition: Goal: Adequate nutrition will be maintained Outcome: Progressing   Problem: Coping: Goal: Level of anxiety will decrease Outcome: Progressing   Problem: Elimination: Goal: Will not experience complications related to urinary retention Outcome: Progressing   Problem: Safety: Goal: Ability to remain free from injury will improve Outcome: Progressing   Problem: Skin Integrity: Goal: Risk for impaired skin integrity will decrease Outcome: Progressing   Problem: Respiratory: Goal: Ability to maintain a clear airway  will improve Outcome: Progressing   Problem: Education: Goal: Knowledge of disease or condition will improve Outcome: Progressing Goal: Understanding of medication regimen will improve Outcome: Progressing Goal: Individualized Educational Video(s) Outcome: Progressing   Problem: Activity: Goal: Ability to tolerate increased activity will improve Outcome: Progressing   Problem: Cardiac: Goal: Ability to achieve and maintain adequate cardiopulmonary perfusion will improve Outcome: Progressing   Problem: Education: Goal: Knowledge of the prescribed therapeutic regimen will improve Outcome: Progressing   Problem: Coping: Goal: Ability to  identify and develop effective coping behavior will improve Outcome: Progressing   Problem: Clinical Measurements: Goal: Quality of life will improve Outcome: Progressing   Problem: Role Relationship: Goal: Family's ability to cope with current situation will improve Outcome: Progressing Goal: Ability to verbalize concerns, feelings, and thoughts to partner or family member will improve Outcome: Progressing   Problem: Pain Management: Goal: Satisfaction with pain management regimen will improve Outcome: Progressing   "

## 2024-05-24 NOTE — Progress Notes (Signed)
 " [name]  FMW:[id number] DOB: 1922/04/12 DOA: 05/11/2024 PCP: [name], MD   Brief Narrative:   89 y.o. male with medical history significant for CKD 3, hypertension, gout. Patient was brought to the ED via EMS with reports of 1 week of progressive generalized weakness, sore throat, cough, nausea and vomiting and lower abdominal pain.  Over the past week he has barely had any oral intake, he has had vomiting with every oral intake, and has not been able to stay hydrated.  Patient was admitted with acute hypoxemic respiratory failure secondary to influenza A infection with likely superimposed pneumonia.  He also has new onset atrial fibrillation with RVR.  He currently continues to have significant amounts of weakness and poor oral intake with hypernatremia.  Transitioned to full comfort care on 05/18/24. Residential hospice/[facility] unable to take patient due to positive flu test   Medical director at [facility] denied residential hospice, will reassess on Monday. Family can't take him home.   Assessment & Plan:  Principal Problem:   Hypernatremia Active Problems:   Influenza A   Acute hypoxic respiratory failure (HCC)   Atrial fibrillation with RVR (HCC)   -influenza A infection with presumed superimposed pneumonia - Treatment with Tamiflu  completed -Completed 5 days of appropriate antibiotics for pneumonia --At family request transitioned patient to full comfort care on 05/18/2024 -- continues to be unable to eat enough to sustain himself -Patient without fevers or any other influenza symptoms for several days now -Repeat testing done on 1/2 is still positive for Flu A   Acute hypoxic respiratory failure - Secondary to above -Hypoxia has resolved patient has been weaned off oxygen completely - Comfort care    New onset atrial fibrillation with RVR:  - 2D echo demonstrating preserved ejection fraction and no significant valvular disorder. --  Comfort care   Chronic kidney disease stage IIIb - Appears to be close to his baseline,  -- On comfort care   Hypertension   Severe protein calorie malnutrition -Body mass index is 14.68 kg/m.  -On comfort care now  Hypernatremia - Resolved   weakness/deconditioning/anorexia -- Comfort care now   Disposition: Hospice residential facility, if approved. [facility] will come reassess him on  Monday. His family can't take care of him at home.   DVT prophylaxis: Not indicated     Code Status: Do not attempt resuscitation (DNR) - Comfort care Family Communication:  None at the bedside Status is: Inpatient Remains inpatient appropriate because: on comfort care    Subjective:  No acute events overnight.  Denies any active complaints.  Examination:  General exam: Alert and awake Respiratory system: Clear to auscultation. Respiratory effort normal. Cardiovascular system: S1 & S2 heard, RRR. No JVD, murmurs, rubs, gallops or clicks. No pedal edema. Gastrointestinal system: Abdomen is nondistended, soft and nontender. No organomegaly or masses felt. Normal bowel sounds heard. Central nervous system: Alert and awake, no focal neurological deficits Extremities: Equal power bilaterally      Diet Orders (From admission, onward)     Start     Ordered   05/13/24 1449  DIET - DYS 1 Room service appropriate? Yes; Fluid consistency: Thin  Diet effective now       Question Answer Comment  Room service appropriate? Yes   Fluid consistency: Thin      05/13/24 1449            Objective: Vitals:   05/22/24 2016 05/23/24 9192 05/23/24 2037  05/24/24 0542  BP: 105/76 111/82 134/89 118/81  Pulse: 82 (!) 105 89 81  Resp: 14 16 16 15   Temp: (!) 97.4 F (36.3 C) 97.7 F (36.5 C) 97.8 F (36.6 C) 97.6 F (36.4 C)  TempSrc: Axillary Oral  Oral  SpO2:  98% 97% 100%  Weight:      Height:        Intake/Output Summary (Last 24 hours) at 05/24/2024 0749 Last data filed at  05/24/2024 0557 Gross per 24 hour  Intake 340 ml  Output --  Net 340 ml   Filed Weights   05/11/24 1710  Weight: 46.4 kg    Scheduled Meds:  aspirin   81 mg Oral Daily   feeding supplement  237 mL Oral BID BM   metoprolol  tartrate  25 mg Oral BID   Continuous Infusions:  Nutritional status     Body mass index is 14.68 kg/m.  Data Reviewed:   CBC: Recent Labs  Lab 05/23/24 1049  WBC 7.7  NEUTROABS 5.9  HGB 12.5*  HCT 40.2  MCV 99.3  PLT 260    Basic Metabolic Panel: Recent Labs  Lab 05/23/24 1049  NA 145  K 4.2  CL 112*  CO2 19*  GLUCOSE 104*  BUN 30*  CREATININE 1.48*  CALCIUM  8.6*    GFR: Estimated Creatinine Clearance: 17.4 mL/min (A) (by C-G formula based on SCr of 1.48 mg/dL (H)). Liver Function Tests: No results for input(s): AST, ALT, ALKPHOS, BILITOT, PROT, ALBUMIN in the last 168 hours. No results for input(s): LIPASE, AMYLASE in the last 168 hours. No results for input(s): AMMONIA in the last 168 hours. Coagulation Profile: No results for input(s): INR, PROTIME in the last 168 hours. Cardiac Enzymes: No results for input(s): CKTOTAL, CKMB, CKMBINDEX, TROPONINI in the last 168 hours. BNP (last 3 results) No results for input(s): PROBNP in the last 8760 hours. HbA1C: No results for input(s): HGBA1C in the last 72 hours. CBG: Recent Labs  Lab 05/18/24 0421 05/18/24 0741 05/18/24 0831 05/18/24 2230 05/19/24 2031  GLUCAP 78 64* 88 90 108*   Lipid Profile: No results for input(s): CHOL, HDL, LDLCALC, TRIG, CHOLHDL, LDLDIRECT in the last 72 hours. Thyroid  Function Tests: No results for input(s): TSH, T4TOTAL, FREET4, T3FREE, THYROIDAB in the last 72 hours. Anemia Panel: No results for input(s): VITAMINB12, FOLATE, FERRITIN, TIBC, IRON, RETICCTPCT in the last 72 hours. Sepsis Labs: No results for input(s): PROCALCITON, LATICACIDVEN in the last 168 hours.   Recent  Results (from the past 240 hours)  Resp panel by RT-PCR (RSV, Flu A&B, Covid) Anterior Nasal Swab     Status: Abnormal   Collection Time: 05/19/24 11:46 AM   Specimen: Anterior Nasal Swab  Result Value Ref Range Status   SARS Coronavirus 2 by RT PCR NEGATIVE NEGATIVE Final    Comment: (NOTE) SARS-CoV-2 target nucleic acids are NOT DETECTED.  The SARS-CoV-2 RNA is generally detectable in upper respiratory specimens during the acute phase of infection. The lowest concentration of SARS-CoV-2 viral copies this assay can detect is 138 copies/mL. A negative result does not preclude SARS-Cov-2 infection and should not be used as the sole basis for treatment or other patient management decisions. A negative result may occur with  improper specimen collection/handling, submission of specimen other than nasopharyngeal swab, presence of viral mutation(s) within the areas targeted by this assay, and inadequate number of viral copies(<138 copies/mL). A negative result must be combined with clinical observations, patient history, and epidemiological information. The expected result  is Negative.  Fact Sheet for Patients:  bloggercourse.com  Fact Sheet for Healthcare Providers:  seriousbroker.it  This test is no t yet approved or cleared by the [place]  FDA and  has been authorized for detection and/or diagnosis of SARS-CoV-2 by FDA under an Emergency Use Authorization (EUA). This EUA will remain  in effect (meaning this test can be used) for the duration of the COVID-19 declaration under Section 564(b)(1) of the Act, 21 U.S.C.section 360bbb-3(b)(1), unless the authorization is terminated  or revoked sooner.       Influenza A by PCR POSITIVE (A) NEGATIVE Final   Influenza B by PCR NEGATIVE NEGATIVE Final    Comment: (NOTE) The Xpert Xpress SARS-CoV-2/FLU/RSV plus assay is intended as an aid in the diagnosis of influenza from  Nasopharyngeal swab specimens and should not be used as a sole basis for treatment. Nasal washings and aspirates are unacceptable for Xpert Xpress SARS-CoV-2/FLU/RSV testing.  Fact Sheet for Patients: bloggercourse.com  Fact Sheet for Healthcare Providers: seriousbroker.it  This test is not yet approved or cleared by the [place]  FDA and has been authorized for detection and/or diagnosis of SARS-CoV-2 by FDA under an Emergency Use Authorization (EUA). This EUA will remain in effect (meaning this test can be used) for the duration of the COVID-19 declaration under Section 564(b)(1) of the Act, 21 U.S.C. section 360bbb-3(b)(1), unless the authorization is terminated or revoked.     Resp Syncytial Virus by PCR NEGATIVE NEGATIVE Final    Comment: (NOTE) Fact Sheet for Patients: bloggercourse.com  Fact Sheet for Healthcare Providers: seriousbroker.it  This test is not yet approved or cleared by the [place]  FDA and has been authorized for detection and/or diagnosis of SARS-CoV-2 by FDA under an Emergency Use Authorization (EUA). This EUA will remain in effect (meaning this test can be used) for the duration of the COVID-19 declaration under Section 564(b)(1) of the Act, 21 U.S.C. section 360bbb-3(b)(1), unless the authorization is terminated or revoked.  Performed at [facility], [address] [place], [place] [address]   Resp panel by RT-PCR (RSV, Flu A&B, Covid) Anterior Nasal Swab     Status: Abnormal   Collection Time: 05/21/24 12:46 AM   Specimen: Anterior Nasal Swab  Result Value Ref Range Status   SARS Coronavirus 2 by RT PCR NEGATIVE NEGATIVE Final    Comment: (NOTE) SARS-CoV-2 target nucleic acids are NOT DETECTED.  The SARS-CoV-2 RNA is generally detectable in upper respiratory specimens during the acute phase of infection. The lowest concentration of  SARS-CoV-2 viral copies this assay can detect is 138 copies/mL. A negative result does not preclude SARS-Cov-2 infection and should not be used as the sole basis for treatment or other patient management decisions. A negative result may occur with  improper specimen collection/handling, submission of specimen other than nasopharyngeal swab, presence of viral mutation(s) within the areas targeted by this assay, and inadequate number of viral copies(<138 copies/mL). A negative result must be combined with clinical observations, patient history, and epidemiological information. The expected result is Negative.  Fact Sheet for Patients:  bloggercourse.com  Fact Sheet for Healthcare Providers:  seriousbroker.it  This test is no t yet approved or cleared by the [place]  FDA and  has been authorized for detection and/or diagnosis of SARS-CoV-2 by FDA under an Emergency Use Authorization (EUA). This EUA will remain  in effect (meaning this test can be used) for the duration of the COVID-19 declaration under Section 564(b)(1) of the Act, 21 U.S.C.section 360bbb-3(b)(1), unless  the authorization is terminated  or revoked sooner.       Influenza A by PCR POSITIVE (A) NEGATIVE Final   Influenza B by PCR NEGATIVE NEGATIVE Final    Comment: (NOTE) The Xpert Xpress SARS-CoV-2/FLU/RSV plus assay is intended as an aid in the diagnosis of influenza from Nasopharyngeal swab specimens and should not be used as a sole basis for treatment. Nasal washings and aspirates are unacceptable for Xpert Xpress SARS-CoV-2/FLU/RSV testing.  Fact Sheet for Patients: bloggercourse.com  Fact Sheet for Healthcare Providers: seriousbroker.it  This test is not yet approved or cleared by the [place]  FDA and has been authorized for detection and/or diagnosis of SARS-CoV-2 by FDA under an Emergency Use  Authorization (EUA). This EUA will remain in effect (meaning this test can be used) for the duration of the COVID-19 declaration under Section 564(b)(1) of the Act, 21 U.S.C. section 360bbb-3(b)(1), unless the authorization is terminated or revoked.     Resp Syncytial Virus by PCR NEGATIVE NEGATIVE Final    Comment: (NOTE) Fact Sheet for Patients: bloggercourse.com  Fact Sheet for Healthcare Providers: seriousbroker.it  This test is not yet approved or cleared by the [place]  FDA and has been authorized for detection and/or diagnosis of SARS-CoV-2 by FDA under an Emergency Use Authorization (EUA). This EUA will remain in effect (meaning this test can be used) for the duration of the COVID-19 declaration under Section 564(b)(1) of the Act, 21 U.S.C. section 360bbb-3(b)(1), unless the authorization is terminated or revoked.  Performed at [facility], [address], [place], [place] [address]   Resp panel by RT-PCR (RSV, Flu A&B, Covid) Anterior Nasal Swab     Status: Abnormal   Collection Time: 05/23/24 10:36 AM   Specimen: Anterior Nasal Swab  Result Value Ref Range Status   SARS Coronavirus 2 by RT PCR NEGATIVE NEGATIVE Final    Comment: (NOTE) SARS-CoV-2 target nucleic acids are NOT DETECTED.  The SARS-CoV-2 RNA is generally detectable in upper respiratory specimens during the acute phase of infection. The lowest concentration of SARS-CoV-2 viral copies this assay can detect is 138 copies/mL. A negative result does not preclude SARS-Cov-2 infection and should not be used as the sole basis for treatment or other patient management decisions. A negative result may occur with  improper specimen collection/handling, submission of specimen other than nasopharyngeal swab, presence of viral mutation(s) within the areas targeted by this assay, and inadequate number of viral copies(<138 copies/mL). A negative result must be  combined with clinical observations, patient history, and epidemiological information. The expected result is Negative.  Fact Sheet for Patients:  bloggercourse.com  Fact Sheet for Healthcare Providers:  seriousbroker.it  This test is no t yet approved or cleared by the [place]  FDA and  has been authorized for detection and/or diagnosis of SARS-CoV-2 by FDA under an Emergency Use Authorization (EUA). This EUA will remain  in effect (meaning this test can be used) for the duration of the COVID-19 declaration under Section 564(b)(1) of the Act, 21 U.S.C.section 360bbb-3(b)(1), unless the authorization is terminated  or revoked sooner.       Influenza A by PCR POSITIVE (A) NEGATIVE Final   Influenza B by PCR NEGATIVE NEGATIVE Final    Comment: (NOTE) The Xpert Xpress SARS-CoV-2/FLU/RSV plus assay is intended as an aid in the diagnosis of influenza from Nasopharyngeal swab specimens and should not be used as a sole basis for treatment. Nasal washings and aspirates are unacceptable for Xpert Xpress SARS-CoV-2/FLU/RSV testing.  Fact Sheet  for Patients: bloggercourse.com  Fact Sheet for Healthcare Providers: seriousbroker.it  This test is not yet approved or cleared by the [place]  FDA and has been authorized for detection and/or diagnosis of SARS-CoV-2 by FDA under an Emergency Use Authorization (EUA). This EUA will remain in effect (meaning this test can be used) for the duration of the COVID-19 declaration under Section 564(b)(1) of the Act, 21 U.S.C. section 360bbb-3(b)(1), unless the authorization is terminated or revoked.     Resp Syncytial Virus by PCR NEGATIVE NEGATIVE Final    Comment: (NOTE) Fact Sheet for Patients: bloggercourse.com  Fact Sheet for Healthcare Providers: seriousbroker.it  This test is not  yet approved or cleared by the [place]  FDA and has been authorized for detection and/or diagnosis of SARS-CoV-2 by FDA under an Emergency Use Authorization (EUA). This EUA will remain in effect (meaning this test can be used) for the duration of the COVID-19 declaration under Section 564(b)(1) of the Act, 21 U.S.C. section 360bbb-3(b)(1), unless the authorization is terminated or revoked.  Performed at [facility], [address] [place], [place] [address]          Radiology Studies: No results found.         LOS: 13 days   Time spent= 35 mins    Deliliah Room, MD [facility]  If 7PM-7AM, please contact night-coverage  05/24/2024, 7:49 AM  "

## 2024-05-25 DIAGNOSIS — E870 Hyperosmolality and hypernatremia: Secondary | ICD-10-CM | POA: Diagnosis not present

## 2024-05-25 NOTE — Progress Notes (Signed)
 " [name]  FMW:[id number] DOB: 01-05-1922 DOA: 05/11/2024 PCP: [name], MD   Brief Narrative:   89 y.o. male with medical history significant for CKD 3, hypertension, gout. Patient was brought to the ED via EMS with reports of 1 week of progressive generalized weakness, sore throat, cough, nausea and vomiting and lower abdominal pain.  Over the past week he has barely had any oral intake, he has had vomiting with every oral intake, and has not been able to stay hydrated.  Patient was admitted with acute hypoxemic respiratory failure secondary to influenza A infection with likely superimposed pneumonia.  He also has new onset atrial fibrillation with RVR.  He currently continues to have significant amounts of weakness and poor oral intake with hypernatremia.  Transitioned to full comfort care on 05/18/24. Residential hospice/[facility] unable to take patient due to positive flu test   Medical director at [facility] denied residential hospice, will reassess on Monday. Family can't take him home.   Assessment & Plan:  Principal Problem:   Hypernatremia Active Problems:   Influenza A   Acute hypoxic respiratory failure (HCC)   Atrial fibrillation with RVR (HCC)   -influenza A infection with presumed superimposed pneumonia - Treatment with Tamiflu  completed -Completed 5 days of appropriate antibiotics for pneumonia --At family request transitioned patient to full comfort care on 05/18/2024 -- continues to be unable to eat enough to sustain himself -Patient without fevers or any other influenza symptoms for several days now -Repeat testing done on 1/2 is still positive for Flu A   Acute hypoxic respiratory failure - Secondary to above -Hypoxia has resolved patient has been weaned off oxygen completely - Comfort care    New onset atrial fibrillation with RVR:  - 2D echo demonstrating preserved ejection fraction and no significant valvular disorder. --  Comfort care   Chronic kidney disease stage IIIb - Appears to be close to his baseline,  -- On comfort care   Hypertension   Severe protein calorie malnutrition -Body mass index is 14.68 kg/m.  -On comfort care now  Hypernatremia - Resolved   weakness/deconditioning/anorexia -- Comfort care now   Disposition: Hospice residential facility, if approved. [facility] will come reassess him on  Monday. His family can't take care of him at home.   DVT prophylaxis: Not indicated     Code Status: Do not attempt resuscitation (DNR) - Comfort care Family Communication:  None at the bedside Status is: Inpatient Remains inpatient appropriate because: on comfort care    Subjective:  No acute events overnight. Drowsy and unable to communicate  Examination:  General exam: drowsy and unable to communicate Respiratory system: Clear to auscultation. Respiratory effort normal. Cardiovascular system: S1 & S2 heard, RRR. No JVD, murmurs, rubs, gallops or clicks. No pedal edema. Gastrointestinal system: Abdomen is nondistended, soft and nontender. No organomegaly or masses felt. Normal bowel sounds heard. Central nervous system: Drowsy. No obvious focal deficits       Diet Orders (From admission, onward)     Start     Ordered   05/13/24 1449  DIET - DYS 1 Room service appropriate? Yes; Fluid consistency: Thin  Diet effective now       Question Answer Comment  Room service appropriate? Yes   Fluid consistency: Thin      05/13/24 1449            Objective: Vitals:   05/23/24 2037 05/24/24 0542 05/24/24 2021 05/25/24 9557  BP: 134/89 118/81 110/81 116/72  Pulse: 89 81 93 (!) 103  Resp: 16 15 20 17   Temp: 97.8 F (36.6 C) 97.6 F (36.4 C) 97.9 F (36.6 C) 97.8 F (36.6 C)  TempSrc:  Oral    SpO2: 97% 100%  92%  Weight:      Height:        Intake/Output Summary (Last 24 hours) at 05/25/2024 0755 Last data filed at 05/25/2024 0534 Gross per 24 hour  Intake 240 ml   Output --  Net 240 ml   Filed Weights   05/11/24 1710  Weight: 46.4 kg    Scheduled Meds:  aspirin   81 mg Oral Daily   feeding supplement  237 mL Oral BID BM   metoprolol  tartrate  25 mg Oral BID   Continuous Infusions:  Nutritional status     Body mass index is 14.68 kg/m.  Data Reviewed:   CBC: Recent Labs  Lab 05/23/24 1049  WBC 7.7  NEUTROABS 5.9  HGB 12.5*  HCT 40.2  MCV 99.3  PLT 260    Basic Metabolic Panel: Recent Labs  Lab 05/23/24 1049  NA 145  K 4.2  CL 112*  CO2 19*  GLUCOSE 104*  BUN 30*  CREATININE 1.48*  CALCIUM  8.6*    GFR: Estimated Creatinine Clearance: 17.4 mL/min (A) (by C-G formula based on SCr of 1.48 mg/dL (H)). Liver Function Tests: No results for input(s): AST, ALT, ALKPHOS, BILITOT, PROT, ALBUMIN in the last 168 hours. No results for input(s): LIPASE, AMYLASE in the last 168 hours. No results for input(s): AMMONIA in the last 168 hours. Coagulation Profile: No results for input(s): INR, PROTIME in the last 168 hours. Cardiac Enzymes: No results for input(s): CKTOTAL, CKMB, CKMBINDEX, TROPONINI in the last 168 hours. BNP (last 3 results) No results for input(s): PROBNP in the last 8760 hours. HbA1C: No results for input(s): HGBA1C in the last 72 hours. CBG: Recent Labs  Lab 05/18/24 0831 05/18/24 2230 05/19/24 2031  GLUCAP 88 90 108*   Lipid Profile: No results for input(s): CHOL, HDL, LDLCALC, TRIG, CHOLHDL, LDLDIRECT in the last 72 hours. Thyroid  Function Tests: No results for input(s): TSH, T4TOTAL, FREET4, T3FREE, THYROIDAB in the last 72 hours. Anemia Panel: No results for input(s): VITAMINB12, FOLATE, FERRITIN, TIBC, IRON, RETICCTPCT in the last 72 hours. Sepsis Labs: No results for input(s): PROCALCITON, LATICACIDVEN in the last 168 hours.   Recent Results (from the past 240 hours)  Resp panel by RT-PCR (RSV, Flu A&B, Covid)  Anterior Nasal Swab     Status: Abnormal   Collection Time: 05/19/24 11:46 AM   Specimen: Anterior Nasal Swab  Result Value Ref Range Status   SARS Coronavirus 2 by RT PCR NEGATIVE NEGATIVE Final    Comment: (NOTE) SARS-CoV-2 target nucleic acids are NOT DETECTED.  The SARS-CoV-2 RNA is generally detectable in upper respiratory specimens during the acute phase of infection. The lowest concentration of SARS-CoV-2 viral copies this assay can detect is 138 copies/mL. A negative result does not preclude SARS-Cov-2 infection and should not be used as the sole basis for treatment or other patient management decisions. A negative result may occur with  improper specimen collection/handling, submission of specimen other than nasopharyngeal swab, presence of viral mutation(s) within the areas targeted by this assay, and inadequate number of viral copies(<138 copies/mL). A negative result must be combined with clinical observations, patient history, and epidemiological information. The expected result is Negative.  Fact Sheet for Patients:  bloggercourse.com  Fact Sheet for Healthcare Providers:  seriousbroker.it  This test is no t yet approved or cleared by the [place]  FDA and  has been authorized for detection and/or diagnosis of SARS-CoV-2 by FDA under an Emergency Use Authorization (EUA). This EUA will remain  in effect (meaning this test can be used) for the duration of the COVID-19 declaration under Section 564(b)(1) of the Act, 21 U.S.C.section 360bbb-3(b)(1), unless the authorization is terminated  or revoked sooner.       Influenza A by PCR POSITIVE (A) NEGATIVE Final   Influenza B by PCR NEGATIVE NEGATIVE Final    Comment: (NOTE) The Xpert Xpress SARS-CoV-2/FLU/RSV plus assay is intended as an aid in the diagnosis of influenza from Nasopharyngeal swab specimens and should not be used as a sole basis for treatment. Nasal  washings and aspirates are unacceptable for Xpert Xpress SARS-CoV-2/FLU/RSV testing.  Fact Sheet for Patients: bloggercourse.com  Fact Sheet for Healthcare Providers: seriousbroker.it  This test is not yet approved or cleared by the [place]  FDA and has been authorized for detection and/or diagnosis of SARS-CoV-2 by FDA under an Emergency Use Authorization (EUA). This EUA will remain in effect (meaning this test can be used) for the duration of the COVID-19 declaration under Section 564(b)(1) of the Act, 21 U.S.C. section 360bbb-3(b)(1), unless the authorization is terminated or revoked.     Resp Syncytial Virus by PCR NEGATIVE NEGATIVE Final    Comment: (NOTE) Fact Sheet for Patients: bloggercourse.com  Fact Sheet for Healthcare Providers: seriousbroker.it  This test is not yet approved or cleared by the [place]  FDA and has been authorized for detection and/or diagnosis of SARS-CoV-2 by FDA under an Emergency Use Authorization (EUA). This EUA will remain in effect (meaning this test can be used) for the duration of the COVID-19 declaration under Section 564(b)(1) of the Act, 21 U.S.C. section 360bbb-3(b)(1), unless the authorization is terminated or revoked.  Performed at [facility], [address], [place], [place] [address]   Resp panel by RT-PCR (RSV, Flu A&B, Covid) Anterior Nasal Swab     Status: Abnormal   Collection Time: 05/21/24 12:46 AM   Specimen: Anterior Nasal Swab  Result Value Ref Range Status   SARS Coronavirus 2 by RT PCR NEGATIVE NEGATIVE Final    Comment: (NOTE) SARS-CoV-2 target nucleic acids are NOT DETECTED.  The SARS-CoV-2 RNA is generally detectable in upper respiratory specimens during the acute phase of infection. The lowest concentration of SARS-CoV-2 viral copies this assay can detect is 138 copies/mL. A negative result does not  preclude SARS-Cov-2 infection and should not be used as the sole basis for treatment or other patient management decisions. A negative result may occur with  improper specimen collection/handling, submission of specimen other than nasopharyngeal swab, presence of viral mutation(s) within the areas targeted by this assay, and inadequate number of viral copies(<138 copies/mL). A negative result must be combined with clinical observations, patient history, and epidemiological information. The expected result is Negative.  Fact Sheet for Patients:  bloggercourse.com  Fact Sheet for Healthcare Providers:  seriousbroker.it  This test is no t yet approved or cleared by the [place]  FDA and  has been authorized for detection and/or diagnosis of SARS-CoV-2 by FDA under an Emergency Use Authorization (EUA). This EUA will remain  in effect (meaning this test can be used) for the duration of the COVID-19 declaration under Section 564(b)(1) of the Act, 21 U.S.C.section 360bbb-3(b)(1), unless the authorization is terminated  or revoked sooner.  Influenza A by PCR POSITIVE (A) NEGATIVE Final   Influenza B by PCR NEGATIVE NEGATIVE Final    Comment: (NOTE) The Xpert Xpress SARS-CoV-2/FLU/RSV plus assay is intended as an aid in the diagnosis of influenza from Nasopharyngeal swab specimens and should not be used as a sole basis for treatment. Nasal washings and aspirates are unacceptable for Xpert Xpress SARS-CoV-2/FLU/RSV testing.  Fact Sheet for Patients: bloggercourse.com  Fact Sheet for Healthcare Providers: seriousbroker.it  This test is not yet approved or cleared by the [place]  FDA and has been authorized for detection and/or diagnosis of SARS-CoV-2 by FDA under an Emergency Use Authorization (EUA). This EUA will remain in effect (meaning this test can be used) for the  duration of the COVID-19 declaration under Section 564(b)(1) of the Act, 21 U.S.C. section 360bbb-3(b)(1), unless the authorization is terminated or revoked.     Resp Syncytial Virus by PCR NEGATIVE NEGATIVE Final    Comment: (NOTE) Fact Sheet for Patients: bloggercourse.com  Fact Sheet for Healthcare Providers: seriousbroker.it  This test is not yet approved or cleared by the [place]  FDA and has been authorized for detection and/or diagnosis of SARS-CoV-2 by FDA under an Emergency Use Authorization (EUA). This EUA will remain in effect (meaning this test can be used) for the duration of the COVID-19 declaration under Section 564(b)(1) of the Act, 21 U.S.C. section 360bbb-3(b)(1), unless the authorization is terminated or revoked.  Performed at [facility], [address], [place], [place] [address]   Resp panel by RT-PCR (RSV, Flu A&B, Covid) Anterior Nasal Swab     Status: Abnormal   Collection Time: 05/23/24 10:36 AM   Specimen: Anterior Nasal Swab  Result Value Ref Range Status   SARS Coronavirus 2 by RT PCR NEGATIVE NEGATIVE Final    Comment: (NOTE) SARS-CoV-2 target nucleic acids are NOT DETECTED.  The SARS-CoV-2 RNA is generally detectable in upper respiratory specimens during the acute phase of infection. The lowest concentration of SARS-CoV-2 viral copies this assay can detect is 138 copies/mL. A negative result does not preclude SARS-Cov-2 infection and should not be used as the sole basis for treatment or other patient management decisions. A negative result may occur with  improper specimen collection/handling, submission of specimen other than nasopharyngeal swab, presence of viral mutation(s) within the areas targeted by this assay, and inadequate number of viral copies(<138 copies/mL). A negative result must be combined with clinical observations, patient history, and epidemiological information. The  expected result is Negative.  Fact Sheet for Patients:  bloggercourse.com  Fact Sheet for Healthcare Providers:  seriousbroker.it  This test is no t yet approved or cleared by the [place]  FDA and  has been authorized for detection and/or diagnosis of SARS-CoV-2 by FDA under an Emergency Use Authorization (EUA). This EUA will remain  in effect (meaning this test can be used) for the duration of the COVID-19 declaration under Section 564(b)(1) of the Act, 21 U.S.C.section 360bbb-3(b)(1), unless the authorization is terminated  or revoked sooner.       Influenza A by PCR POSITIVE (A) NEGATIVE Final   Influenza B by PCR NEGATIVE NEGATIVE Final    Comment: (NOTE) The Xpert Xpress SARS-CoV-2/FLU/RSV plus assay is intended as an aid in the diagnosis of influenza from Nasopharyngeal swab specimens and should not be used as a sole basis for treatment. Nasal washings and aspirates are unacceptable for Xpert Xpress SARS-CoV-2/FLU/RSV testing.  Fact Sheet for Patients: bloggercourse.com  Fact Sheet for Healthcare Providers: seriousbroker.it  This test is  not yet approved or cleared by the [place]  FDA and has been authorized for detection and/or diagnosis of SARS-CoV-2 by FDA under an Emergency Use Authorization (EUA). This EUA will remain in effect (meaning this test can be used) for the duration of the COVID-19 declaration under Section 564(b)(1) of the Act, 21 U.S.C. section 360bbb-3(b)(1), unless the authorization is terminated or revoked.     Resp Syncytial Virus by PCR NEGATIVE NEGATIVE Final    Comment: (NOTE) Fact Sheet for Patients: bloggercourse.com  Fact Sheet for Healthcare Providers: seriousbroker.it  This test is not yet approved or cleared by the [place]  FDA and has been authorized for detection and/or  diagnosis of SARS-CoV-2 by FDA under an Emergency Use Authorization (EUA). This EUA will remain in effect (meaning this test can be used) for the duration of the COVID-19 declaration under Section 564(b)(1) of the Act, 21 U.S.C. section 360bbb-3(b)(1), unless the authorization is terminated or revoked.  Performed at [facility], [address], [place], [place] [address]          Radiology Studies: No results found.         LOS: 14 days   Time spent= 35 mins    Deliliah Room, MD [facility]  If 7PM-7AM, please contact night-coverage  05/25/2024, 7:55 AM  "

## 2024-05-26 DIAGNOSIS — E870 Hyperosmolality and hypernatremia: Secondary | ICD-10-CM | POA: Diagnosis not present

## 2024-05-26 DIAGNOSIS — Z515 Encounter for palliative care: Secondary | ICD-10-CM | POA: Diagnosis not present

## 2024-05-26 DIAGNOSIS — R627 Adult failure to thrive: Secondary | ICD-10-CM | POA: Diagnosis not present

## 2024-05-26 DIAGNOSIS — J101 Influenza due to other identified influenza virus with other respiratory manifestations: Secondary | ICD-10-CM | POA: Diagnosis not present

## 2024-05-26 DIAGNOSIS — Z7189 Other specified counseling: Secondary | ICD-10-CM | POA: Diagnosis not present

## 2024-05-26 MED ORDER — NYSTATIN 100000 UNIT/GM EX POWD
Freq: Two times a day (BID) | CUTANEOUS | Status: DC
  Filled 2024-05-26 (×2): qty 15

## 2024-05-26 NOTE — Evaluation (Signed)
 Occupational Therapy Evaluation Patient Details Name: Hector Lowe MRN: [id number] DOB: 28-Jan-1922 Today's Date: 05/26/2024   History of Present Illness   Hector Lowe is a 89 y.o. male with medical history significant for CKD 3, hypertension, gout.  Patient was brought to the ED via EMS with reports of 1 week of progressive generalized weakness, sore throat, cough, nausea and vomiting and lower abdominal pain.  Over the past week he has barely had any oral intake, he has had vomiting with every oral intake, and has not been able to stay hydrated.  He has also had cough productive of thick sputum.  No chest pain.  Per family, patient initially declined coming to the ED, today he was unable to get out of bed felt worse and so agreed to come in.  Saw his outpatient provider a few days ago, was diagnosed with a sinus infection, started on a course of penicillin.     Clinical Impressions Pt agreeable to OT evaluation. Pt had previously been removed from the OT list by others involved in POC. A new OT evaluation was completed today with pt needing CGA for bed mobility. CGA to min A for sit to stand and min A for R lateral steps to head of bed with RW. Pt still unable to complete B LE ADL tasks based on pt report when asked if he could attempt. Pt demonstrating level of set up assist for seated upper body ADL tasks. Pt left in the bed with call bell within reach and bed alarm set. RN notified of pt's mobility status. Pt will benefit from continued OT in the hospital to increase strength, balance, and endurance for safe ADL's.        If plan is discharge home, recommend the following:   A little help with walking and/or transfers;A lot of help with bathing/dressing/bathroom;Assist for transportation;Help with stairs or ramp for entrance;Assistance with cooking/housework     Functional Status Assessment   Patient has had a recent decline in their functional status and demonstrates the ability  to make significant improvements in function in a reasonable and predictable amount of time.     Equipment Recommendations   None recommended by OT             Precautions/Restrictions   Precautions Precautions: Fall Recall of Precautions/Restrictions: Intact Restrictions Weight Bearing Restrictions Per Provider Order: No     Mobility Bed Mobility Overal bed mobility: Needs Assistance Bed Mobility: Supine to Sit, Sit to Supine     Supine to sit: Contact guard Sit to supine: Contact guard assist   General bed mobility comments: HOB flat; mild labored movement.    Transfers Overall transfer level: Needs assistance Equipment used: Rolling walker (2 wheels) Transfers: Sit to/from Stand Sit to Stand: Contact guard assist, Min assist           General transfer comment: x2 reps of sit to stand ; R lateral steps to head of bed with min A as well. Assist to manage RW for R lateral steps.      Balance Overall balance assessment: Needs assistance Sitting-balance support: Feet supported, No upper extremity supported Sitting balance-Leahy Scale: Fair Sitting balance - Comments: seated at EOB   Standing balance support: Bilateral upper extremity supported, During functional activity, Reliant on assistive device for balance Standing balance-Leahy Scale: Poor Standing balance comment: fair/poor using RW  ADL either performed or assessed with clinical judgement   ADL Overall ADL's : Needs assistance/impaired Eating/Feeding: Set up;Sitting   Grooming: Set up;Sitting   Upper Body Bathing: Set up;Sitting   Lower Body Bathing: Moderate assistance;Maximal assistance;Sitting/lateral leans   Upper Body Dressing : Set up;Sitting   Lower Body Dressing: Maximal assistance;Bed level Lower Body Dressing Details (indicate cue type and reason): Pt reported inability to doff socks at bed level today. Toilet Transfer: Contact guard  assist;Minimal assistance;Rolling walker (2 wheels) Toilet Transfer Details (indicate cue type and reason): Partially simulated via x2 sit to stand from EOB and R lateral steps with RW. Toileting- Clothing Manipulation and Hygiene: Moderate assistance;Sit to/from stand               Vision Baseline Vision/History: 1 Wears glasses;6 Macular Degeneration Ability to See in Adequate Light: 3 Highly impaired Vision Assessment?:  (Baseline deficits.)     Perception Perception: Not tested       Praxis Praxis: Not tested       Pertinent Vitals/Pain Pain Assessment Pain Assessment: Faces Faces Pain Scale: Hurts a little bit Pain Location: Chronic back pain. Pain Descriptors / Indicators: Discomfort Pain Intervention(s): Limited activity within patient's tolerance, Monitored during session, Repositioned     Extremity/Trunk Assessment Upper Extremity Assessment Upper Extremity Assessment: Generalized weakness   Lower Extremity Assessment Lower Extremity Assessment: Defer to PT evaluation   Cervical / Trunk Assessment Cervical / Trunk Assessment: Kyphotic   Communication Communication Communication: Impaired Factors Affecting Communication: Hearing impaired   Cognition Arousal: Alert Behavior During Therapy: WFL for tasks assessed/performed Cognition: No apparent impairments                               Following commands: Intact       Cueing  General Comments   Cueing Techniques: Verbal cues;Tactile cues;Gestural cues  Pt not wearing supplemental O2 today.              Home Living Family/patient expects to be discharged to:: Private residence Living Arrangements: Alone Available Help at Discharge: Family;Available PRN/intermittently Type of Home: House Home Access: Stairs to enter Entrance Stairs-Number of Steps: 1.5 Entrance Stairs-Rails: None Home Layout: Two level;Laundry or work area in basement;Able to live on main level with  bedroom/bathroom     Bathroom Shower/Tub: Producer, Television/film/video: Standard Bathroom Accessibility: Yes How Accessible: Accessible via wheelchair;Accessible via walker Home Equipment: Agricultural Consultant (2 wheels);Rollator (4 wheels);BSC/3in1;Cane - single point;Cane - quad   Additional Comments: Per previous information.      Prior Functioning/Environment Prior Level of Function : Independent/Modified Independent             Mobility Comments: SPC used for community ambulation; No AD used in the house until recently when pt started using rollator. ADLs Comments: Independent ADL's; able to cook simple meals.    OT Problem List: Decreased strength;Decreased activity tolerance;Impaired balance (sitting and/or standing);Impaired vision/perception   OT Treatment/Interventions: Self-care/ADL training;Therapeutic exercise;Patient/family education;Balance training;Therapeutic activities;DME and/or AE instruction;Energy conservation      OT Goals(Current goals can be found in the care plan section)   Acute Rehab OT Goals Patient Stated Goal: Improve function. OT Goal Formulation: With patient Time For Goal Achievement: 06/09/24 Potential to Achieve Goals: Good   OT Frequency:  Min 2X/week  End of Session Equipment Utilized During Treatment: Gait belt;Rolling walker (2 wheels)  Activity Tolerance: Patient tolerated treatment well Patient left: in bed;with call bell/phone within reach;with bed alarm set  OT Visit Diagnosis: Unsteadiness on feet (R26.81);Other abnormalities of gait and mobility (R26.89);Muscle weakness (generalized) (M62.81)                Time: 8377-8364 OT Time Calculation (min): 13 min Charges:  OT General Charges $OT Visit: 1 Visit OT Evaluation $OT Eval Low Complexity: 1 Low  Kyliegh Jester OT, MOT  Jayson Person 05/26/2024, 4:53 PM

## 2024-05-26 NOTE — Plan of Care (Signed)
" °  Problem: Health Behavior/Discharge Planning: Goal: Ability to manage health-related needs will improve Outcome: Progressing   Problem: Pain Management: Goal: Satisfaction with pain management regimen will improve Outcome: Progressing   "

## 2024-05-26 NOTE — TOC Progression Note (Signed)
 Transition of Care [facility]) - Progression Note    Patient Details  Name: Hector Lowe MRN: [id number] Date of Birth: 01/04/1922  Transition [facility]) CM/SW Contact  Ronnald MARLA Sil, RN Phone Number: 05/26/2024, 4:52 PM  Clinical Narrative:    CM received call-back from Gilmer GLENWOOD Cones, presented him with documented bed offer from [facility], and verbal bed offer from [facility] liaison - Isaiah.  Kesler indicated he would discuss the offer with his wife and Dtr this evening and would call CM - Asberry back in the morning to update her on their decision.  CM team will continue to follow along and assist with formulating a discharge plan that will address the patient's deficits and promote optimal outcomes.     Expected Discharge Plan: Skilled Nursing Facility Barriers to Discharge: Continued Medical Work up

## 2024-05-26 NOTE — Plan of Care (Signed)
" °  Problem: Acute Rehab OT Goals (only OT should resolve) Goal: Pt. Will Perform Grooming Flowsheets (Taken 05/26/2024 1659) Pt Will Perform Grooming:  with modified independence  standing Goal: Pt. Will Perform Lower Body Bathing Flowsheets (Taken 05/26/2024 1659) Pt Will Perform Lower Body Bathing:  with modified independence  sitting/lateral leans Goal: Pt. Will Perform Lower Body Dressing Flowsheets (Taken 05/26/2024 1659) Pt Will Perform Lower Body Dressing:  with modified independence  sitting/lateral leans Goal: Pt. Will Transfer To Toilet Flowsheets (Taken 05/26/2024 1659) Pt Will Transfer to Toilet:  with modified independence  ambulating Goal: Pt. Will Perform Toileting-Clothing Manipulation Flowsheets (Taken 05/26/2024 1659) Pt Will Perform Toileting - Clothing Manipulation and hygiene:  with modified independence  sitting/lateral leans Goal: Pt/Caregiver Will Perform Home Exercise Program Flowsheets (Taken 05/26/2024 1659) Pt/caregiver will Perform Home Exercise Program:  Increased strength  Both right and left upper extremity  Independently  Lenny Bouchillon OT, MOT  "

## 2024-05-26 NOTE — Progress Notes (Addendum)
 " [name]  FMW:[id number] DOB: Jan 14, 1922 DOA: 05/11/2024 PCP: [name], MD   Brief Narrative:   89 y.o. male with medical history significant for CKD 3, hypertension, gout. Patient was brought to the ED via EMS with reports of 1 week of progressive generalized weakness, sore throat, cough, nausea and vomiting and lower abdominal pain.  Over the past week he has barely had any oral intake, he has had vomiting with every oral intake, and has not been able to stay hydrated.  Patient was admitted with acute hypoxemic respiratory failure secondary to influenza A infection with likely superimposed pneumonia.  He also has new onset atrial fibrillation with RVR.  He currently continues to have significant amounts of weakness and poor oral intake with hypernatremia.  Transitioned to full comfort care on 05/18/24. Residential hospice/[facility] unable to take patient due to positive flu test   Medical director at [facility] denied residential hospice, will reassess on Monday. Family can't take him home.   Assessment & Plan:  Principal Problem:   Hypernatremia Active Problems:   Influenza A   Acute hypoxic respiratory failure (HCC)   Atrial fibrillation with RVR (HCC)   -influenza A infection with presumed superimposed pneumonia - Treatment with Tamiflu  completed -Completed 5 days of appropriate antibiotics for pneumonia --At family request transitioned patient to full comfort care on 05/18/2024 -- continues to be unable to eat enough to sustain himself -Patient without fevers or any other influenza symptoms for several days now -Repeat testing done on 1/2 is still positive for Flu A   Acute hypoxic respiratory failure - Secondary to above -Hypoxia has resolved patient has been weaned off oxygen completely - Comfort care    New onset atrial fibrillation with RVR:  - 2D echo demonstrating preserved ejection fraction and no significant valvular disorder. --  Comfort care   Chronic kidney disease stage IIIb - Appears to be close to his baseline,  -- On comfort care   Hypertension   Severe protein calorie malnutrition -Body mass index is 14.68 kg/m.  -On comfort care now  Hypernatremia - Resolved   weakness/deconditioning/anorexia -- Comfort care now   Disposition: Hospice residential facility didn't approve.   DVT prophylaxis: Not indicated     Code Status: Do not attempt resuscitation (DNR) - Comfort care Family Communication:  None at the bedside Status is: Inpatient Remains inpatient appropriate because: on comfort care    Subjective:  No acute events overnight. Alert and awake, complaining of constipation  Examination:  General exam: alert and awake Respiratory system: Clear to auscultation. Respiratory effort normal. Cardiovascular system: S1 & S2 heard, RRR. No JVD, murmurs, rubs, gallops or clicks. No pedal edema. Gastrointestinal system: Abdomen is nondistended, soft and nontender. No organomegaly or masses felt. Normal bowel sounds heard. Central nervous system: alert and awake. No obvious focal deficits       Diet Orders (From admission, onward)     Start     Ordered   05/13/24 1449  DIET - DYS 1 Room service appropriate? Yes; Fluid consistency: Thin  Diet effective now       Question Answer Comment  Room service appropriate? Yes   Fluid consistency: Thin      05/13/24 1449            Objective: Vitals:   05/25/24 0442 05/25/24 1337 05/25/24 2109 05/26/24 0436  BP: 116/72 129/88 123/86 91/65  Pulse: (!) 103 92 93 (!) 106  Resp: 17  18  18  Temp: 97.8 F (36.6 C) (!) 97.5 F (36.4 C) (!) 97.5 F (36.4 C)   TempSrc:  Axillary Axillary   SpO2: 92% 98% 98% 100%  Weight:      Height:        Intake/Output Summary (Last 24 hours) at 05/26/2024 0837 Last data filed at 05/25/2024 1722 Gross per 24 hour  Intake 220 ml  Output 350 ml  Net -130 ml   Filed Weights   05/11/24 1710  Weight:  46.4 kg    Scheduled Meds:  aspirin   81 mg Oral Daily   feeding supplement  237 mL Oral BID BM   metoprolol  tartrate  25 mg Oral BID   Continuous Infusions:  Nutritional status     Body mass index is 14.68 kg/m.  Data Reviewed:   CBC: Recent Labs  Lab 05/23/24 1049  WBC 7.7  NEUTROABS 5.9  HGB 12.5*  HCT 40.2  MCV 99.3  PLT 260    Basic Metabolic Panel: Recent Labs  Lab 05/23/24 1049  NA 145  K 4.2  CL 112*  CO2 19*  GLUCOSE 104*  BUN 30*  CREATININE 1.48*  CALCIUM  8.6*    GFR: Estimated Creatinine Clearance: 17.4 mL/min (A) (by C-G formula based on SCr of 1.48 mg/dL (H)). Liver Function Tests: No results for input(s): AST, ALT, ALKPHOS, BILITOT, PROT, ALBUMIN in the last 168 hours. No results for input(s): LIPASE, AMYLASE in the last 168 hours. No results for input(s): AMMONIA in the last 168 hours. Coagulation Profile: No results for input(s): INR, PROTIME in the last 168 hours. Cardiac Enzymes: No results for input(s): CKTOTAL, CKMB, CKMBINDEX, TROPONINI in the last 168 hours. BNP (last 3 results) No results for input(s): PROBNP in the last 8760 hours. HbA1C: No results for input(s): HGBA1C in the last 72 hours. CBG: Recent Labs  Lab 05/19/24 2031  GLUCAP 108*   Lipid Profile: No results for input(s): CHOL, HDL, LDLCALC, TRIG, CHOLHDL, LDLDIRECT in the last 72 hours. Thyroid  Function Tests: No results for input(s): TSH, T4TOTAL, FREET4, T3FREE, THYROIDAB in the last 72 hours. Anemia Panel: No results for input(s): VITAMINB12, FOLATE, FERRITIN, TIBC, IRON, RETICCTPCT in the last 72 hours. Sepsis Labs: No results for input(s): PROCALCITON, LATICACIDVEN in the last 168 hours.   Recent Results (from the past 240 hours)  Resp panel by RT-PCR (RSV, Flu A&B, Covid) Anterior Nasal Swab     Status: Abnormal   Collection Time: 05/19/24 11:46 AM   Specimen: Anterior Nasal Swab   Result Value Ref Range Status   SARS Coronavirus 2 by RT PCR NEGATIVE NEGATIVE Final    Comment: (NOTE) SARS-CoV-2 target nucleic acids are NOT DETECTED.  The SARS-CoV-2 RNA is generally detectable in upper respiratory specimens during the acute phase of infection. The lowest concentration of SARS-CoV-2 viral copies this assay can detect is 138 copies/mL. A negative result does not preclude SARS-Cov-2 infection and should not be used as the sole basis for treatment or other patient management decisions. A negative result may occur with  improper specimen collection/handling, submission of specimen other than nasopharyngeal swab, presence of viral mutation(s) within the areas targeted by this assay, and inadequate number of viral copies(<138 copies/mL). A negative result must be combined with clinical observations, patient history, and epidemiological information. The expected result is Negative.  Fact Sheet for Patients:  bloggercourse.com  Fact Sheet for Healthcare Providers:  seriousbroker.it  This test is no t yet approved or cleared by the [place]   FDA and  has been authorized for detection and/or diagnosis of SARS-CoV-2 by FDA under an Emergency Use Authorization (EUA). This EUA will remain  in effect (meaning this test can be used) for the duration of the COVID-19 declaration under Section 564(b)(1) of the Act, 21 U.S.C.section 360bbb-3(b)(1), unless the authorization is terminated  or revoked sooner.       Influenza A by PCR POSITIVE (A) NEGATIVE Final   Influenza B by PCR NEGATIVE NEGATIVE Final    Comment: (NOTE) The Xpert Xpress SARS-CoV-2/FLU/RSV plus assay is intended as an aid in the diagnosis of influenza from Nasopharyngeal swab specimens and should not be used as a sole basis for treatment. Nasal washings and aspirates are unacceptable for Xpert Xpress SARS-CoV-2/FLU/RSV testing.  Fact Sheet for  Patients: bloggercourse.com  Fact Sheet for Healthcare Providers: seriousbroker.it  This test is not yet approved or cleared by the [place]  FDA and has been authorized for detection and/or diagnosis of SARS-CoV-2 by FDA under an Emergency Use Authorization (EUA). This EUA will remain in effect (meaning this test can be used) for the duration of the COVID-19 declaration under Section 564(b)(1) of the Act, 21 U.S.C. section 360bbb-3(b)(1), unless the authorization is terminated or revoked.     Resp Syncytial Virus by PCR NEGATIVE NEGATIVE Final    Comment: (NOTE) Fact Sheet for Patients: bloggercourse.com  Fact Sheet for Healthcare Providers: seriousbroker.it  This test is not yet approved or cleared by the [place]  FDA and has been authorized for detection and/or diagnosis of SARS-CoV-2 by FDA under an Emergency Use Authorization (EUA). This EUA will remain in effect (meaning this test can be used) for the duration of the COVID-19 declaration under Section 564(b)(1) of the Act, 21 U.S.C. section 360bbb-3(b)(1), unless the authorization is terminated or revoked.  Performed at [facility], [address], [place], [place] [address]   Resp panel by RT-PCR (RSV, Flu A&B, Covid) Anterior Nasal Swab     Status: Abnormal   Collection Time: 05/21/24 12:46 AM   Specimen: Anterior Nasal Swab  Result Value Ref Range Status   SARS Coronavirus 2 by RT PCR NEGATIVE NEGATIVE Final    Comment: (NOTE) SARS-CoV-2 target nucleic acids are NOT DETECTED.  The SARS-CoV-2 RNA is generally detectable in upper respiratory specimens during the acute phase of infection. The lowest concentration of SARS-CoV-2 viral copies this assay can detect is 138 copies/mL. A negative result does not preclude SARS-Cov-2 infection and should not be used as the sole basis for treatment or other patient  management decisions. A negative result may occur with  improper specimen collection/handling, submission of specimen other than nasopharyngeal swab, presence of viral mutation(s) within the areas targeted by this assay, and inadequate number of viral copies(<138 copies/mL). A negative result must be combined with clinical observations, patient history, and epidemiological information. The expected result is Negative.  Fact Sheet for Patients:  bloggercourse.com  Fact Sheet for Healthcare Providers:  seriousbroker.it  This test is no t yet approved or cleared by the [place]  FDA and  has been authorized for detection and/or diagnosis of SARS-CoV-2 by FDA under an Emergency Use Authorization (EUA). This EUA will remain  in effect (meaning this test can be used) for the duration of the COVID-19 declaration under Section 564(b)(1) of the Act, 21 U.S.C.section 360bbb-3(b)(1), unless the authorization is terminated  or revoked sooner.       Influenza A by PCR POSITIVE (A) NEGATIVE Final   Influenza B by PCR NEGATIVE NEGATIVE Final  Comment: (NOTE) The Xpert Xpress SARS-CoV-2/FLU/RSV plus assay is intended as an aid in the diagnosis of influenza from Nasopharyngeal swab specimens and should not be used as a sole basis for treatment. Nasal washings and aspirates are unacceptable for Xpert Xpress SARS-CoV-2/FLU/RSV testing.  Fact Sheet for Patients: bloggercourse.com  Fact Sheet for Healthcare Providers: seriousbroker.it  This test is not yet approved or cleared by the [place]  FDA and has been authorized for detection and/or diagnosis of SARS-CoV-2 by FDA under an Emergency Use Authorization (EUA). This EUA will remain in effect (meaning this test can be used) for the duration of the COVID-19 declaration under Section 564(b)(1) of the Act, 21 U.S.C. section  360bbb-3(b)(1), unless the authorization is terminated or revoked.     Resp Syncytial Virus by PCR NEGATIVE NEGATIVE Final    Comment: (NOTE) Fact Sheet for Patients: bloggercourse.com  Fact Sheet for Healthcare Providers: seriousbroker.it  This test is not yet approved or cleared by the [place]  FDA and has been authorized for detection and/or diagnosis of SARS-CoV-2 by FDA under an Emergency Use Authorization (EUA). This EUA will remain in effect (meaning this test can be used) for the duration of the COVID-19 declaration under Section 564(b)(1) of the Act, 21 U.S.C. section 360bbb-3(b)(1), unless the authorization is terminated or revoked.  Performed at [facility], [address] [place], [place] [address]   Resp panel by RT-PCR (RSV, Flu A&B, Covid) Anterior Nasal Swab     Status: Abnormal   Collection Time: 05/23/24 10:36 AM   Specimen: Anterior Nasal Swab  Result Value Ref Range Status   SARS Coronavirus 2 by RT PCR NEGATIVE NEGATIVE Final    Comment: (NOTE) SARS-CoV-2 target nucleic acids are NOT DETECTED.  The SARS-CoV-2 RNA is generally detectable in upper respiratory specimens during the acute phase of infection. The lowest concentration of SARS-CoV-2 viral copies this assay can detect is 138 copies/mL. A negative result does not preclude SARS-Cov-2 infection and should not be used as the sole basis for treatment or other patient management decisions. A negative result may occur with  improper specimen collection/handling, submission of specimen other than nasopharyngeal swab, presence of viral mutation(s) within the areas targeted by this assay, and inadequate number of viral copies(<138 copies/mL). A negative result must be combined with clinical observations, patient history, and epidemiological information. The expected result is Negative.  Fact Sheet for Patients:   bloggercourse.com  Fact Sheet for Healthcare Providers:  seriousbroker.it  This test is no t yet approved or cleared by the [place]  FDA and  has been authorized for detection and/or diagnosis of SARS-CoV-2 by FDA under an Emergency Use Authorization (EUA). This EUA will remain  in effect (meaning this test can be used) for the duration of the COVID-19 declaration under Section 564(b)(1) of the Act, 21 U.S.C.section 360bbb-3(b)(1), unless the authorization is terminated  or revoked sooner.       Influenza A by PCR POSITIVE (A) NEGATIVE Final   Influenza B by PCR NEGATIVE NEGATIVE Final    Comment: (NOTE) The Xpert Xpress SARS-CoV-2/FLU/RSV plus assay is intended as an aid in the diagnosis of influenza from Nasopharyngeal swab specimens and should not be used as a sole basis for treatment. Nasal washings and aspirates are unacceptable for Xpert Xpress SARS-CoV-2/FLU/RSV testing.  Fact Sheet for Patients: bloggercourse.com  Fact Sheet for Healthcare Providers: seriousbroker.it  This test is not yet approved or cleared by the [place]  FDA and has been authorized for detection and/or diagnosis of SARS-CoV-2  by FDA under an Emergency Use Authorization (EUA). This EUA will remain in effect (meaning this test can be used) for the duration of the COVID-19 declaration under Section 564(b)(1) of the Act, 21 U.S.C. section 360bbb-3(b)(1), unless the authorization is terminated or revoked.     Resp Syncytial Virus by PCR NEGATIVE NEGATIVE Final    Comment: (NOTE) Fact Sheet for Patients: bloggercourse.com  Fact Sheet for Healthcare Providers: seriousbroker.it  This test is not yet approved or cleared by the [place]  FDA and has been authorized for detection and/or diagnosis of SARS-CoV-2 by FDA under an Emergency Use  Authorization (EUA). This EUA will remain in effect (meaning this test can be used) for the duration of the COVID-19 declaration under Section 564(b)(1) of the Act, 21 U.S.C. section 360bbb-3(b)(1), unless the authorization is terminated or revoked.  Performed at [facility], [address], [place], [place] [address]          Radiology Studies: No results found.         LOS: 15 days   Time spent= 35 mins    Deliliah Room, MD [facility]  If 7PM-7AM, please contact night-coverage  05/26/2024, 8:37 AM  "

## 2024-05-26 NOTE — Progress Notes (Signed)
 Palliative:  HPI: 89 y.o. male  with past medical history of CKD 3, hypertension, gout. Patient was brought to the ED via EMS with reports of 1 week of progressive generalized weakness, sore throat, cough, nausea and vomiting and lower abdominal pain. He was admitted on 05/11/2024 with influenza A with presumed superimposed pneumonia, acute hypoxic respiratory failure, new onset A-fib with RVR at time of admission, CKD 3B, severe protein calorie malnutrition (BMI 14.68), weakness, deconditioning, anorexia, and others. Hospitalization complicated by poor intake and weakness. Failure to thrive. Family have agreed with comfort measures recognizing that he is approaching end of life.    [facility] re-evaluated as requested - appreciate this. They share that he is not eligible to go to hospice facility. Mr. Harolyn continues on a failure to thrive path and overall prognosis is poor but he is not progressing at end of life as quickly as we originally believed he would. Discussed with WONDA Fellows who will reach out to son to discuss options further.   I met with son, Kirt, who has good understanding of the situation. He still endorses seeing signs of progression and belief his father is at end of life - I agree. We discussed how this is just a slower process than we thought it would be. He agrees with TOC recommendations for placement as he knows he cannot stay at the hospital. He wishes that hospice could take his father as he really does desire comfort and rest for his father at this stage of his life. He knows that his father has no quality of life left and would not want to live this way. We agree to continue current care with focus on comfort - keeping his father happy and comfortable and not pursuing measures to prolong him in his declined state of health. Unfortunately he cannot care for his father at home even with help from hospice. He agrees to work up for SNF rehab knowing he may not qualify. He would  agree with LTC with hospice (discussion of transition of Medicaid to LTC Medicaid) if this is an option. He also agrees to pursue hospice facility again if his father's status changes.   All questions/concerns addressed. Emotional support provided.   Exam: Alert, awake, fluctuating confusion. No distress. Breathing regular, unlabored. Abd soft.   Plan: - DNR - Comfort care - TOC assisting with disposition options  50 min  Bernarda Kitty, NP Palliative Medicine Team Pager [phone] (Please see [email or web address] for schedule) Team Phone [phone]

## 2024-05-26 NOTE — Plan of Care (Signed)
  Problem: Clinical Measurements: Goal: Quality of life will improve Outcome: Not Progressing   Problem: Respiratory: Goal: Verbalizations of increased ease of respirations will increase Outcome: Not Progressing   Problem: Role Relationship: Goal: Family's ability to cope with current situation will improve Outcome: Not Progressing Goal: Ability to verbalize concerns, feelings, and thoughts to partner or family member will improve Outcome: Not Progressing

## 2024-05-26 NOTE — TOC Progression Note (Signed)
 Transition of Care [facility]) - Progression Note    Patient Details  Name: Hector Lowe MRN: [id number] Date of Birth: Jun 21, 1921  [facility] CM/SW Contact  Ronnald MARLA Sil, RN Phone Number: 05/26/2024, 12:34 PM  Clinical Narrative:    Just had a lengthy conversation with Hector Lowe to notify him [facility] did not approve patient for admission to [facility].  CM also updated Kessler that the patient is alert and talking today, based on his improvement today, the Hector feels like we should try to move forward with attempt at Rehab and try to strengthen patient to get him better, but also aware this may be a short term rally.    CM discussed SNF placement process, including the need for Pre-Auth approval from patient's [place] [employer].  Kessler indicated familiarity with the process and with nursing facilities in the area, stating he went through the same process when arranging care for his Mother after her stroke a few years ago.  He indicated his SNF preferences as #1 - [facility], #2 - [facility], and agreed to [facility] sending referrals to additional SNF's in the adjacent areas to optimize bed offers.  Patient has an existing PASRR# [id number], FL2 was already generated and Cosigned, PT / OT ordered and pending evals.  CM also discussed option to convert patient's Medicaid to LTC Medicaid in the event the patient needs long-term placement.  Kessler verbalized understanding and expressed appreciation for information shared.  CM team will continue to follow along and assist as appropriate in SNF placement process.   Expected Discharge Plan: Hospice Medical Facility Barriers to Discharge: Other (must enter comment) (Due to Flu waiting on Hospice to accept)  Expected Discharge Plan and Services In-house Referral: Clinical Social Work Discharge Planning Services: CM Consult Post Acute Care Choice: Durable Medical Equipment Living arrangements for the past 2 months: Single  Family Home Expected Discharge Date: 05/19/24               DME Arranged: Oxygen DME Agency: [employer] Date DME Agency Contacted: 05/13/24 Time DME Agency Contacted: 1525 Representative spoke with at DME Agency: Darlyn   Social Drivers of Health (SDOH) Interventions SDOH Screenings   Food Insecurity: Patient Unable To Answer (05/11/2024)  Housing: Unknown (05/11/2024)  Transportation Needs: Patient Unable To Answer (05/11/2024)  Utilities: Patient Unable To Answer (05/11/2024)  Social Connections: Patient Unable To Answer (05/11/2024)  Tobacco Use: High Risk (05/11/2024)    Readmission Risk Interventions    05/14/2024   12:50 PM  Readmission Risk Prevention Plan  Transportation Screening Complete  Home Care Screening Complete  Medication Review (RN CM) Complete

## 2024-05-27 DIAGNOSIS — R627 Adult failure to thrive: Secondary | ICD-10-CM | POA: Diagnosis not present

## 2024-05-27 DIAGNOSIS — Z515 Encounter for palliative care: Secondary | ICD-10-CM | POA: Diagnosis not present

## 2024-05-27 NOTE — Progress Notes (Signed)
 Patient 's urine output has been decreasing due to poor oral intake. Patient managed to drink about 100 mls of water and urinated 75 mls . He refuses to drink any further. Patient repositioned and kept comfortable in bed, Dr. Shona made aware of patient's progression.

## 2024-05-27 NOTE — Progress Notes (Signed)
 Palliative:  HPI: 89 y.o. 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 Required intubation 8/12.   I met today with 3 daughters, sister, brother, and Inge Lecher NP PCCM. We had discussion regarding path forward for one way extubation. We reviewed poor prognosis and expectation that Hector Lowe will not do well once extubated. Family reiterate goal that he not suffer. We reviewed option to have medication available if he is having distress vs having medication already onboard to prevent distress. Family would like to have medication onboard to minimize any suffering during transition off ventilator. They would like to move forward with extubation later today and will have the rest of the family come to visit prior to extubation.   I reviewed plans and symptom management recommendations with RN.   Update: I was present with family prior to additional support as well as throughout extubation. Family appropriately tearful. Assisted to ensure comfort. I left family to visit privately with Hector Lowe after he is resting comfortably after extubation.   All questions/concerns addressed. Emotional support provided. Much time coordinating care with RN, PCCM, and family.   Exam: Sedated on vent. FiO2 50%. Breathing regular, unlabored on vent. No distress. Not following commands. Abd soft. Warm to touch.   Plan:  - DNR - Extubated to full comfort care - Anticipate hospital death  100 min  Hector Kitty, NP Palliative Medicine Team Pager [phone] (Please see [email or web address] for schedule) Team Phone [phone]

## 2024-05-27 NOTE — Progress Notes (Signed)
 Physical Therapy Treatment Patient Details Name: Hector Lowe MRN: [id number] DOB: [birth year] Today's Date: 05/27/2024   History of Present Illness Hector Lowe is a 89 y.o. male with medical history significant for CKD 3, hypertension, gout.  Patient was brought to the ED via EMS with reports of 1 week of progressive generalized weakness, sore throat, cough, nausea and vomiting and lower abdominal pain.  Over the past week he has barely had any oral intake, he has had vomiting with every oral intake, and has not been able to stay hydrated.  He has also had cough productive of thick sputum.  No chest pain.  Per family, patient initially declined coming to the ED, today he was unable to get out of bed felt worse and so agreed to come in.  Saw his outpatient provider a few days ago, was diagnosed with a sinus infection, started on a course of penicillin.    PT Comments  Patient demonstrates slow labored movement for sitting up at bedside, once seated c/o of mild dizziness, also when standing and limited to ambulating in room mostly due to c/o SOB and weakness. Patient tolerated sitting up in chair after therapy with SpO2 at 98% while on room air (to use toe for getting accurate result. Patient will benefit from continued skilled physical therapy in hospital and recommended venue below to increase strength, balance, endurance for safe ADLs and gait.       If plan is discharge home, recommend the following: A lot of help with bathing/dressing/bathroom;A lot of help with walking and/or transfers;Help with stairs or ramp for entrance;Assist for transportation;Assistance with cooking/housework   Can travel by private vehicle     Yes  Equipment Recommendations  None recommended by PT    Recommendations for Other Services       Precautions / Restrictions Precautions Precautions: Fall Recall of Precautions/Restrictions: Intact Restrictions Weight Bearing Restrictions Per Provider Order: No      Mobility  Bed Mobility Overal bed mobility: Needs Assistance Bed Mobility: Supine to Sit     Supine to sit: Contact guard     General bed mobility comments: labored movement, increased time    Transfers Overall transfer level: Needs assistance Equipment used: Rolling walker (2 wheels) Transfers: Sit to/from Stand, Bed to chair/wheelchair/BSC Sit to Stand: Contact guard assist, Min assist   Step pivot transfers: Min assist       General transfer comment: unsteady labored movement with c/o dizziness when standing    Ambulation/Gait Ambulation/Gait assistance: Mod assist Gait Distance (Feet): 20 Feet Assistive device: Rolling walker (2 wheels) Gait Pattern/deviations: Decreased step length - right, Decreased step length - left, Trunk flexed, Decreased stride length, Knees buckling Gait velocity: dec     General Gait Details: slow labored unsteady movement with mild buckling of knees once fatigued and limitd mostly due to c/o severe SOB   Stairs             Wheelchair Mobility     Tilt Bed    Modified Rankin (Stroke Patients Only)       Balance Overall balance assessment: Needs assistance Sitting-balance support: Feet supported, No upper extremity supported Sitting balance-Leahy Scale: Fair Sitting balance - Comments: fair/good seated at EOB   Standing balance support: Reliant on assistive device for balance, During functional activity, Bilateral upper extremity supported Standing balance-Leahy Scale: Poor Standing balance comment: fair/poor using RW  Communication Communication Communication: Impaired Factors Affecting Communication: Hearing impaired  Cognition Arousal: Alert Behavior During Therapy: WFL for tasks assessed/performed                             Following commands: Intact      Cueing Cueing Techniques: Verbal cues, Tactile cues, Gestural cues  Exercises      General  Comments        Pertinent Vitals/Pain Pain Assessment Pain Assessment: No/denies pain    Home Living                          Prior Function            PT Goals (current goals can now be found in the care plan section) Acute Rehab PT Goals Patient Stated Goal: return home after rehab PT Goal Formulation: With patient/family Time For Goal Achievement: 06/10/24 Potential to Achieve Goals: Good Progress towards PT goals: Progressing toward goals    Frequency    Min 3X/week      PT Plan      Co-evaluation              AM-PAC PT 6 Clicks Mobility   Outcome Measure  Help needed turning from your back to your side while in a flat bed without using bedrails?: A Little Help needed moving from lying on your back to sitting on the side of a flat bed without using bedrails?: A Little Help needed moving to and from a bed to a chair (including a wheelchair)?: A Lot Help needed standing up from a chair using your arms (e.g., wheelchair or bedside chair)?: A Little Help needed to walk in hospital room?: A Lot Help needed climbing 3-5 steps with a railing? : A Lot 6 Click Score: 15    End of Session   Activity Tolerance: Patient tolerated treatment well;Patient limited by fatigue Patient left: in chair;with call bell/phone within reach;with chair alarm set Nurse Communication: Mobility status PT Visit Diagnosis: Unsteadiness on feet (R26.81);Other abnormalities of gait and mobility (R26.89);Muscle weakness (generalized) (M62.81)     Time: 8976-8950 PT Time Calculation (min) (ACUTE ONLY): 26 min  Charges:    $Therapeutic Activity: 23-37 mins PT General Charges $$ ACUTE PT VISIT: 1 Visit                     12:36 PM, 05/27/2024 Lynwood Music, MPT Physical Therapist with [facility] [phone] office [phone] mobile phone

## 2024-05-27 NOTE — Progress Notes (Signed)
 " [name]  FMW:[id number] DOB: 11-21-1921 DOA: 05/11/2024 PCP: [name], MD   Brief Narrative:   89 y.o. male with medical history significant for CKD 3, hypertension, gout. Patient was brought to the ED via EMS with reports of 1 week of progressive generalized weakness, sore throat, cough, nausea and vomiting and lower abdominal pain.  Over the past week he has barely had any oral intake, he has had vomiting with every oral intake, and has not been able to stay hydrated.  Patient was admitted with acute hypoxemic respiratory failure secondary to influenza A infection with likely superimposed pneumonia.  He also has new onset atrial fibrillation with RVR.  He currently continues to have significant amounts of weakness and poor oral intake with hypernatremia.  Transitioned to full comfort care on 05/18/24. Residential hospice/[facility] unable to take patient due to positive flu test   Medical director at [facility] denied residential hospice, will reassess on Monday. Family can't take him home.   May need SNF  Assessment & Plan:  Principal Problem:   Hypernatremia Active Problems:   Influenza A   Acute hypoxic respiratory failure (HCC)   Atrial fibrillation with RVR (HCC)   -influenza A infection with presumed superimposed pneumonia - Treatment with Tamiflu  completed -Completed 5 days of appropriate antibiotics for pneumonia --At family request transitioned patient to full comfort care on 05/18/2024 -- continues to be unable to eat enough to sustain himself -Patient without fevers or any other influenza symptoms for several days now -Repeat testing done on 1/2 is still positive for Flu A   Acute hypoxic respiratory failure - Secondary to above -Hypoxia has resolved patient has been weaned off oxygen completely - Comfort care    New onset atrial fibrillation with RVR:  - 2D echo demonstrating preserved ejection fraction and no significant valvular  disorder. -- Comfort care   Chronic kidney disease stage IIIb - Appears to be close to his baseline,  -- On comfort care   Hypertension   Severe protein calorie malnutrition -Body mass index is 14.68 kg/m.  -On comfort care now  Hypernatremia - Resolved   weakness/deconditioning/anorexia -- Comfort care now   Disposition: Hospice residential facility didn't approve.May need SNF   DVT prophylaxis: Not indicated     Code Status: Do not attempt resuscitation (DNR) - Comfort care Family Communication:  None at the bedside Status is: Inpatient Remains inpatient appropriate because: on comfort care    Subjective:  No acute events overnight. Nurse mentioned decreased urine output likely secondary to decreased oral intake. Seen by OT yesterday.  Examination:  General exam: alert and awake Respiratory system: Clear to auscultation. Respiratory effort normal. Cardiovascular system: S1 & S2 heard, RRR. No JVD, murmurs, rubs, gallops or clicks. No pedal edema. Gastrointestinal system: Abdomen is nondistended, soft and nontender. No organomegaly or masses felt. Normal bowel sounds heard. Central nervous system: alert and awake. No obvious focal deficits       Diet Orders (From admission, onward)     Start     Ordered   05/13/24 1449  DIET - DYS 1 Room service appropriate? Yes; Fluid consistency: Thin  Diet effective now       Question Answer Comment  Room service appropriate? Yes   Fluid consistency: Thin      05/13/24 1449            Objective: Vitals:   05/26/24 0436 05/26/24 1026 05/26/24 1342 05/26/24 2037  BP:  91/65 125/79 112/77 (!) 123/91  Pulse: (!) 106  96 98  Resp: 18 18  19   Temp:   (!) 97.5 F (36.4 C) (!) 97.5 F (36.4 C)  TempSrc:   Axillary Oral  SpO2: 100%   99%  Weight:      Height:        Intake/Output Summary (Last 24 hours) at 05/27/2024 0840 Last data filed at 05/27/2024 0506 Gross per 24 hour  Intake 200 ml  Output 125 ml  Net  75 ml   Filed Weights   05/11/24 1710  Weight: 46.4 kg    Scheduled Meds:  aspirin   81 mg Oral Daily   feeding supplement  237 mL Oral BID BM   metoprolol  tartrate  25 mg Oral BID   nystatin    Topical BID   Continuous Infusions:  Nutritional status     Body mass index is 14.68 kg/m.  Data Reviewed:   CBC: Recent Labs  Lab 05/23/24 1049  WBC 7.7  NEUTROABS 5.9  HGB 12.5*  HCT 40.2  MCV 99.3  PLT 260    Basic Metabolic Panel: Recent Labs  Lab 05/23/24 1049  NA 145  K 4.2  CL 112*  CO2 19*  GLUCOSE 104*  BUN 30*  CREATININE 1.48*  CALCIUM  8.6*    GFR: Estimated Creatinine Clearance: 17.4 mL/min (A) (by C-G formula based on SCr of 1.48 mg/dL (H)). Liver Function Tests: No results for input(s): AST, ALT, ALKPHOS, BILITOT, PROT, ALBUMIN in the last 168 hours. No results for input(s): LIPASE, AMYLASE in the last 168 hours. No results for input(s): AMMONIA in the last 168 hours. Coagulation Profile: No results for input(s): INR, PROTIME in the last 168 hours. Cardiac Enzymes: No results for input(s): CKTOTAL, CKMB, CKMBINDEX, TROPONINI in the last 168 hours. BNP (last 3 results) No results for input(s): PROBNP in the last 8760 hours. HbA1C: No results for input(s): HGBA1C in the last 72 hours. CBG: No results for input(s): GLUCAP in the last 168 hours.  Lipid Profile: No results for input(s): CHOL, HDL, LDLCALC, TRIG, CHOLHDL, LDLDIRECT in the last 72 hours. Thyroid  Function Tests: No results for input(s): TSH, T4TOTAL, FREET4, T3FREE, THYROIDAB in the last 72 hours. Anemia Panel: No results for input(s): VITAMINB12, FOLATE, FERRITIN, TIBC, IRON, RETICCTPCT in the last 72 hours. Sepsis Labs: No results for input(s): PROCALCITON, LATICACIDVEN in the last 168 hours.   Recent Results (from the past 240 hours)  Resp panel by RT-PCR (RSV, Flu A&B, Covid) Anterior Nasal Swab      Status: Abnormal   Collection Time: 05/19/24 11:46 AM   Specimen: Anterior Nasal Swab  Result Value Ref Range Status   SARS Coronavirus 2 by RT PCR NEGATIVE NEGATIVE Final    Comment: (NOTE) SARS-CoV-2 target nucleic acids are NOT DETECTED.  The SARS-CoV-2 RNA is generally detectable in upper respiratory specimens during the acute phase of infection. The lowest concentration of SARS-CoV-2 viral copies this assay can detect is 138 copies/mL. A negative result does not preclude SARS-Cov-2 infection and should not be used as the sole basis for treatment or other patient management decisions. A negative result may occur with  improper specimen collection/handling, submission of specimen other than nasopharyngeal swab, presence of viral mutation(s) within the areas targeted by this assay, and inadequate number of viral copies(<138 copies/mL). A negative result must be combined with clinical observations, patient history, and epidemiological information. The expected result is Negative.  Fact Sheet for Patients:  bloggercourse.com  Fact Sheet  for Healthcare Providers:  seriousbroker.it  This test is no t yet approved or cleared by the [place]  FDA and  has been authorized for detection and/or diagnosis of SARS-CoV-2 by FDA under an Emergency Use Authorization (EUA). This EUA will remain  in effect (meaning this test can be used) for the duration of the COVID-19 declaration under Section 564(b)(1) of the Act, 21 U.S.C.section 360bbb-3(b)(1), unless the authorization is terminated  or revoked sooner.       Influenza A by PCR POSITIVE (A) NEGATIVE Final   Influenza B by PCR NEGATIVE NEGATIVE Final    Comment: (NOTE) The Xpert Xpress SARS-CoV-2/FLU/RSV plus assay is intended as an aid in the diagnosis of influenza from Nasopharyngeal swab specimens and should not be used as a sole basis for treatment. Nasal washings and aspirates  are unacceptable for Xpert Xpress SARS-CoV-2/FLU/RSV testing.  Fact Sheet for Patients: bloggercourse.com  Fact Sheet for Healthcare Providers: seriousbroker.it  This test is not yet approved or cleared by the [place]  FDA and has been authorized for detection and/or diagnosis of SARS-CoV-2 by FDA under an Emergency Use Authorization (EUA). This EUA will remain in effect (meaning this test can be used) for the duration of the COVID-19 declaration under Section 564(b)(1) of the Act, 21 U.S.C. section 360bbb-3(b)(1), unless the authorization is terminated or revoked.     Resp Syncytial Virus by PCR NEGATIVE NEGATIVE Final    Comment: (NOTE) Fact Sheet for Patients: bloggercourse.com  Fact Sheet for Healthcare Providers: seriousbroker.it  This test is not yet approved or cleared by the [place]  FDA and has been authorized for detection and/or diagnosis of SARS-CoV-2 by FDA under an Emergency Use Authorization (EUA). This EUA will remain in effect (meaning this test can be used) for the duration of the COVID-19 declaration under Section 564(b)(1) of the Act, 21 U.S.C. section 360bbb-3(b)(1), unless the authorization is terminated or revoked.  Performed at [facility], [address] [place], [place] [address]   Resp panel by RT-PCR (RSV, Flu A&B, Covid) Anterior Nasal Swab     Status: Abnormal   Collection Time: 05/21/24 12:46 AM   Specimen: Anterior Nasal Swab  Result Value Ref Range Status   SARS Coronavirus 2 by RT PCR NEGATIVE NEGATIVE Final    Comment: (NOTE) SARS-CoV-2 target nucleic acids are NOT DETECTED.  The SARS-CoV-2 RNA is generally detectable in upper respiratory specimens during the acute phase of infection. The lowest concentration of SARS-CoV-2 viral copies this assay can detect is 138 copies/mL. A negative result does not preclude  SARS-Cov-2 infection and should not be used as the sole basis for treatment or other patient management decisions. A negative result may occur with  improper specimen collection/handling, submission of specimen other than nasopharyngeal swab, presence of viral mutation(s) within the areas targeted by this assay, and inadequate number of viral copies(<138 copies/mL). A negative result must be combined with clinical observations, patient history, and epidemiological information. The expected result is Negative.  Fact Sheet for Patients:  bloggercourse.com  Fact Sheet for Healthcare Providers:  seriousbroker.it  This test is no t yet approved or cleared by the [place]  FDA and  has been authorized for detection and/or diagnosis of SARS-CoV-2 by FDA under an Emergency Use Authorization (EUA). This EUA will remain  in effect (meaning this test can be used) for the duration of the COVID-19 declaration under Section 564(b)(1) of the Act, 21 U.S.C.section 360bbb-3(b)(1), unless the authorization is terminated  or revoked sooner.  Influenza A by PCR POSITIVE (A) NEGATIVE Final   Influenza B by PCR NEGATIVE NEGATIVE Final    Comment: (NOTE) The Xpert Xpress SARS-CoV-2/FLU/RSV plus assay is intended as an aid in the diagnosis of influenza from Nasopharyngeal swab specimens and should not be used as a sole basis for treatment. Nasal washings and aspirates are unacceptable for Xpert Xpress SARS-CoV-2/FLU/RSV testing.  Fact Sheet for Patients: bloggercourse.com  Fact Sheet for Healthcare Providers: seriousbroker.it  This test is not yet approved or cleared by the [place]  FDA and has been authorized for detection and/or diagnosis of SARS-CoV-2 by FDA under an Emergency Use Authorization (EUA). This EUA will remain in effect (meaning this test can be used) for the duration of  the COVID-19 declaration under Section 564(b)(1) of the Act, 21 U.S.C. section 360bbb-3(b)(1), unless the authorization is terminated or revoked.     Resp Syncytial Virus by PCR NEGATIVE NEGATIVE Final    Comment: (NOTE) Fact Sheet for Patients: bloggercourse.com  Fact Sheet for Healthcare Providers: seriousbroker.it  This test is not yet approved or cleared by the [place]  FDA and has been authorized for detection and/or diagnosis of SARS-CoV-2 by FDA under an Emergency Use Authorization (EUA). This EUA will remain in effect (meaning this test can be used) for the duration of the COVID-19 declaration under Section 564(b)(1) of the Act, 21 U.S.C. section 360bbb-3(b)(1), unless the authorization is terminated or revoked.  Performed at [facility], [address] [place], [place] [address]   Resp panel by RT-PCR (RSV, Flu A&B, Covid) Anterior Nasal Swab     Status: Abnormal   Collection Time: 05/23/24 10:36 AM   Specimen: Anterior Nasal Swab  Result Value Ref Range Status   SARS Coronavirus 2 by RT PCR NEGATIVE NEGATIVE Final    Comment: (NOTE) SARS-CoV-2 target nucleic acids are NOT DETECTED.  The SARS-CoV-2 RNA is generally detectable in upper respiratory specimens during the acute phase of infection. The lowest concentration of SARS-CoV-2 viral copies this assay can detect is 138 copies/mL. A negative result does not preclude SARS-Cov-2 infection and should not be used as the sole basis for treatment or other patient management decisions. A negative result may occur with  improper specimen collection/handling, submission of specimen other than nasopharyngeal swab, presence of viral mutation(s) within the areas targeted by this assay, and inadequate number of viral copies(<138 copies/mL). A negative result must be combined with clinical observations, patient history, and epidemiological information. The expected result  is Negative.  Fact Sheet for Patients:  bloggercourse.com  Fact Sheet for Healthcare Providers:  seriousbroker.it  This test is no t yet approved or cleared by the [place]  FDA and  has been authorized for detection and/or diagnosis of SARS-CoV-2 by FDA under an Emergency Use Authorization (EUA). This EUA will remain  in effect (meaning this test can be used) for the duration of the COVID-19 declaration under Section 564(b)(1) of the Act, 21 U.S.C.section 360bbb-3(b)(1), unless the authorization is terminated  or revoked sooner.       Influenza A by PCR POSITIVE (A) NEGATIVE Final   Influenza B by PCR NEGATIVE NEGATIVE Final    Comment: (NOTE) The Xpert Xpress SARS-CoV-2/FLU/RSV plus assay is intended as an aid in the diagnosis of influenza from Nasopharyngeal swab specimens and should not be used as a sole basis for treatment. Nasal washings and aspirates are unacceptable for Xpert Xpress SARS-CoV-2/FLU/RSV testing.  Fact Sheet for Patients: bloggercourse.com  Fact Sheet for Healthcare Providers: seriousbroker.it  This test is  not yet approved or cleared by the [place]  FDA and has been authorized for detection and/or diagnosis of SARS-CoV-2 by FDA under an Emergency Use Authorization (EUA). This EUA will remain in effect (meaning this test can be used) for the duration of the COVID-19 declaration under Section 564(b)(1) of the Act, 21 U.S.C. section 360bbb-3(b)(1), unless the authorization is terminated or revoked.     Resp Syncytial Virus by PCR NEGATIVE NEGATIVE Final    Comment: (NOTE) Fact Sheet for Patients: bloggercourse.com  Fact Sheet for Healthcare Providers: seriousbroker.it  This test is not yet approved or cleared by the [place]  FDA and has been authorized for detection and/or diagnosis of  SARS-CoV-2 by FDA under an Emergency Use Authorization (EUA). This EUA will remain in effect (meaning this test can be used) for the duration of the COVID-19 declaration under Section 564(b)(1) of the Act, 21 U.S.C. section 360bbb-3(b)(1), unless the authorization is terminated or revoked.  Performed at [facility], [address] [place], [place] [address]          Radiology Studies: No results found.         LOS: 16 days   Time spent= 35 mins    Deliliah Room, MD [facility]  If 7PM-7AM, please contact night-coverage  05/27/2024, 8:40 AM  "

## 2024-05-27 NOTE — TOC Progression Note (Signed)
 Transition of Care [facility]) - Progression Note    Patient Details  Name: Hector Lowe MRN: [id number] Date of Birth: 1921/11/25  Transition of Care [facility]) CM/SW Contact  [name], [place] Phone Number: 05/27/2024, 2:33 PM  Clinical Narrative:     CSW reached out to [place] this AM to see if they had a beds for patient since he is medically ready for DC. Michael in admission, shared that he did not have  a bed today and would let me know day by day ,  but  feels that they will have a bed on Monday 1/12. CSW called family and asked son to give him a update on [facility] and asked about [facility] since it is somewhat close to them. Son agreeable to CSW sending referral out to [facility]. CSW will continue to follow.   Expected Discharge Plan: Skilled Nursing Facility Barriers to Discharge: No SNF bed     Expected Discharge Plan and Services In-house Referral: Clinical Social Work Discharge Planning Services: CM Consult Post Acute Care Choice: Durable Medical Equipment Living arrangements for the past 2 months: Single Family Home Expected Discharge Date: 05/19/24               DME Arranged: Oxygen DME Agency: [employer] Date DME Agency Contacted: 05/13/24 Time DME Agency Contacted: 1525 Representative spoke with at DME Agency: Darlyn             Social Drivers of Health (SDOH) Interventions SDOH Screenings   Food Insecurity: Patient Unable To Answer (05/11/2024)  Housing: Unknown (05/11/2024)  Transportation Needs: Patient Unable To Answer (05/11/2024)  Utilities: Patient Unable To Answer (05/11/2024)  Social Connections: Patient Unable To Answer (05/11/2024)  Tobacco Use: High Risk (05/11/2024)    Readmission Risk Interventions    05/27/2024    2:33 PM 05/14/2024   12:50 PM  Readmission Risk Prevention Plan  Transportation Screening Complete Complete  Home Care Screening Complete Complete  Medication Review (RN CM) Complete Complete

## 2024-05-27 NOTE — Plan of Care (Signed)
" °  Problem: Education: Goal: Knowledge of the prescribed therapeutic regimen will improve Outcome: Progressing   Problem: Coping: Goal: Ability to identify and develop effective coping behavior will improve Outcome: Progressing   Problem: Role Relationship: Goal: Ability to verbalize concerns, feelings, and thoughts to partner or family member will improve Outcome: Progressing   Problem: Pain Management: Goal: Satisfaction with pain management regimen will improve Outcome: Progressing   Problem: Clinical Measurements: Goal: Quality of life will improve Outcome: Not Progressing   "

## 2024-05-28 MED ORDER — MORPHINE SULFATE (CONCENTRATE) 10 MG /0.5 ML PO SOLN: 5.0000 mg | ORAL | 0 refills | Status: DC | PRN

## 2024-05-28 MED ORDER — NAPHAZOLINE-GLYCERIN 0.012-0.25 % OP SOLN: 1.0000 [drp] | Freq: Four times a day (QID) | OPHTHALMIC | Status: DC | PRN

## 2024-05-28 MED ORDER — BIOTENE DRY MOUTH MT LIQD: 15.0000 mL | OROMUCOSAL | Status: DC | PRN

## 2024-05-28 MED ORDER — ENSURE PLUS HIGH PROTEIN PO LIQD: 237.0000 mL | Freq: Two times a day (BID) | ORAL | Status: DC

## 2024-05-28 MED ORDER — NYSTATIN 100000 UNIT/GM EX POWD: Freq: Two times a day (BID) | CUTANEOUS | Status: DC

## 2024-05-28 MED ORDER — POLYVINYL ALCOHOL 1.4 % OP SOLN: 1.0000 [drp] | Freq: Four times a day (QID) | OPHTHALMIC | Status: DC | PRN

## 2024-05-28 MED ORDER — LORAZEPAM 1 MG PO TABS: 1.0000 mg | ORAL_TABLET | ORAL | 0 refills | Status: DC | PRN

## 2024-05-28 MED ORDER — METOPROLOL TARTRATE 25 MG PO TABS: 25.0000 mg | ORAL_TABLET | Freq: Two times a day (BID) | ORAL | Status: DC

## 2024-05-28 NOTE — Plan of Care (Signed)

## 2024-05-28 NOTE — Plan of Care (Signed)
 Palliative:  Mr. Hector Lowe is sleeping. No family at bedside. Discussed with RN who reports that patient requested oxygen after he had a good visit with visitors at bedside. RN reports that she believes he likely got too excited and overdid it during his visit. He is resting comfortably on 1L nasal cannula. He has PRN medications available to assist with any symptoms including shortness of breath if needed. Okay to provide low amount nasal cannula for comfort as requested. I will continue to monitor for changes to indicate re-evaluation by hospice - not indicated today.   No charge  Bernarda Kitty, NP Palliative Medicine Team Pager [phone] (Please see [email or web address] for schedule) Team Phone [phone]

## 2024-05-28 NOTE — Plan of Care (Signed)
" °  Problem: Education: Goal: Knowledge of the prescribed therapeutic regimen will improve Outcome: Progressing   Problem: Role Relationship: Goal: Family's ability to cope with current situation will improve Outcome: Progressing   Problem: Pain Management: Goal: Satisfaction with pain management regimen will improve Outcome: Progressing   Problem: Clinical Measurements: Goal: Quality of life will improve Outcome: Not Progressing   Problem: Respiratory: Goal: Verbalizations of increased ease of respirations will increase Outcome: Not Progressing   "

## 2024-05-28 NOTE — TOC Progression Note (Signed)
 Transition of Care [facility]) - Progression Note    Patient Details  Name: Hector Lowe MRN: [id number] Date of Birth: 10/16/1921  Transition of Care [facility]) CM/SW Contact  Ronnald MARLA Sil, RN Phone Number: 05/28/2024, 3:20 PM  Clinical Narrative:    Patient discussed during interdisciplinary progression rounds this morning, patient remains medically ready to discharge pending availability of SNF bed for STR.  CM contacted liaisons for [employer], and [address] again today, still no male beds available.  IPCM team will continue to follow along and monitor patient advancement through interdisciplinary progression rounds.    Expected Discharge Plan: Skilled Nursing Facility Barriers to Discharge: No SNF bed               Expected Discharge Plan and Services In-house Referral: Clinical Social Work Discharge Planning Services: CM Consult Post Acute Care Choice: Durable Medical Equipment Living arrangements for the past 2 months: Single Family Home Expected Discharge Date: 05/19/24               DME Arranged: Oxygen DME Agency: [employer] Date DME Agency Contacted: 05/13/24 Time DME Agency Contacted: 1525 Representative spoke with at DME Agency: Darlyn             Social Drivers of Health (SDOH) Interventions SDOH Screenings   Food Insecurity: Patient Unable To Answer (05/11/2024)  Housing: Unknown (05/11/2024)  Transportation Needs: Patient Unable To Answer (05/11/2024)  Utilities: Patient Unable To Answer (05/11/2024)  Social Connections: Patient Unable To Answer (05/11/2024)  Tobacco Use: High Risk (05/11/2024)    Readmission Risk Interventions    05/27/2024    2:33 PM 05/14/2024   12:50 PM  Readmission Risk Prevention Plan  Transportation Screening Complete Complete  Home Care Screening Complete Complete  Medication Review (RN CM) Complete Complete

## 2024-05-28 NOTE — Progress Notes (Addendum)
 " [name]  FMW:[id number] DOB: 1922-04-23 DOA: 05/11/2024 PCP: [name], MD   Brief Narrative:   89 y.o. male with medical history significant for CKD 3, hypertension, gout. Patient was brought to the ED via EMS with reports of 1 week of progressive generalized weakness, sore throat, cough, nausea and vomiting and lower abdominal pain.  Over the past week he has barely had any oral intake, he has had vomiting with every oral intake, and has not been able to stay hydrated.  Patient was admitted with acute hypoxemic respiratory failure secondary to influenza A infection with likely superimposed pneumonia.  He also has new onset atrial fibrillation with RVR.  He currently continues to have significant amounts of weakness and poor oral intake with hypernatremia.  Transitioned to full comfort care on 05/18/24. Residential hospice/[facility] unable to take patient due to positive flu test   Medical director at [facility] denied residential hospice, will reassess on Monday. Family can't take him home.   Needs SNF  Assessment & Plan:  Principal Problem:   Hypernatremia Active Problems:   Influenza A   Acute hypoxic respiratory failure (HCC)   Atrial fibrillation with RVR (HCC)   -influenza A infection with presumed superimposed pneumonia - Treatment with Tamiflu  completed -Completed 5 days of appropriate antibiotics for pneumonia --At family request transitioned patient to full comfort care on 05/18/2024 -- continues to be unable to eat enough to sustain himself -Patient without fevers or any other influenza symptoms for several days now -Repeat testing done on 1/2 is still positive for Flu A   Acute hypoxic respiratory failure - Secondary to above -Hypoxia has resolved patient has been weaned off oxygen completely - Comfort care    New onset atrial fibrillation with RVR:  - 2D echo demonstrating preserved ejection fraction and no significant valvular  disorder. -- Comfort care   Chronic kidney disease stage IIIb - Appears to be close to his baseline,  -- On comfort care   Hypertension   Severe protein calorie malnutrition -Body mass index is 14.68 kg/m.  -On comfort care now  Hypernatremia - Resolved   weakness/deconditioning/anorexia -- Comfort care now   Disposition: Hospice residential facility didn't approve.Needs SNF ([facility] or [facility])   DVT prophylaxis: Not indicated     Code Status: Do not attempt resuscitation (DNR) - Comfort care Family Communication:  None at the bedside Status is: Inpatient Remains inpatient appropriate because: on comfort care    Subjective:  No acute events overnight. Denied any active complaints.  Examination:  General exam: alert and awake Respiratory system: Clear to auscultation. Respiratory effort normal. Cardiovascular system: S1 & S2 heard, RRR. No JVD, murmurs, rubs, gallops or clicks. No pedal edema. Gastrointestinal system: Abdomen is nondistended, soft and nontender. No organomegaly or masses felt. Normal bowel sounds heard. Central nervous system: alert and awake. No obvious focal deficits       Diet Orders (From admission, onward)     Start     Ordered   05/13/24 1449  DIET - DYS 1 Room service appropriate? Yes; Fluid consistency: Thin  Diet effective now       Question Answer Comment  Room service appropriate? Yes   Fluid consistency: Thin      05/13/24 1449            Objective: Vitals:   05/26/24 2037 05/27/24 1320 05/27/24 2000 05/28/24 0437  BP: (!) 123/91 118/79 107/70 123/89  Pulse: 98 (!) 48  64 (!) 126  Resp: 19 20 20 19   Temp: (!) 97.5 F (36.4 C) 98.8 F (37.1 C) 97.7 F (36.5 C) (!) 97.4 F (36.3 C)  TempSrc: Oral Oral Oral Oral  SpO2: 99% (!) 84% 94% 94%  Weight:      Height:        Intake/Output Summary (Last 24 hours) at 05/28/2024 0901 Last data filed at 05/27/2024 1300 Gross per 24 hour  Intake 50 ml  Output 100 ml  Net -50 ml    Filed Weights   05/11/24 1710  Weight: 46.4 kg    Scheduled Meds:  aspirin   81 mg Oral Daily   feeding supplement  237 mL Oral BID BM   metoprolol  tartrate  25 mg Oral BID   nystatin    Topical BID   Continuous Infusions:  Nutritional status     Body mass index is 14.68 kg/m.  Data Reviewed:   CBC: Recent Labs  Lab 05/23/24 1049  WBC 7.7  NEUTROABS 5.9  HGB 12.5*  HCT 40.2  MCV 99.3  PLT 260    Basic Metabolic Panel: Recent Labs  Lab 05/23/24 1049  NA 145  K 4.2  CL 112*  CO2 19*  GLUCOSE 104*  BUN 30*  CREATININE 1.48*  CALCIUM  8.6*    GFR: Estimated Creatinine Clearance: 17.4 mL/min (A) (by C-G formula based on SCr of 1.48 mg/dL (H)). Liver Function Tests: No results for input(s): AST, ALT, ALKPHOS, BILITOT, PROT, ALBUMIN in the last 168 hours. No results for input(s): LIPASE, AMYLASE in the last 168 hours. No results for input(s): AMMONIA in the last 168 hours. Coagulation Profile: No results for input(s): INR, PROTIME in the last 168 hours. Cardiac Enzymes: No results for input(s): CKTOTAL, CKMB, CKMBINDEX, TROPONINI in the last 168 hours. BNP (last 3 results) No results for input(s): PROBNP in the last 8760 hours. HbA1C: No results for input(s): HGBA1C in the last 72 hours. CBG: No results for input(s): GLUCAP in the last 168 hours.  Lipid Profile: No results for input(s): CHOL, HDL, LDLCALC, TRIG, CHOLHDL, LDLDIRECT in the last 72 hours. Thyroid  Function Tests: No results for input(s): TSH, T4TOTAL, FREET4, T3FREE, THYROIDAB in the last 72 hours. Anemia Panel: No results for input(s): VITAMINB12, FOLATE, FERRITIN, TIBC, IRON, RETICCTPCT in the last 72 hours. Sepsis Labs: No results for input(s): PROCALCITON, LATICACIDVEN in the last 168 hours.   Recent Results (from the past 240 hours)  Resp panel by RT-PCR (RSV, Flu A&B, Covid) Anterior Nasal Swab     Status:  Abnormal   Collection Time: 05/19/24 11:46 AM   Specimen: Anterior Nasal Swab  Result Value Ref Range Status   SARS Coronavirus 2 by RT PCR NEGATIVE NEGATIVE Final    Comment: (NOTE) SARS-CoV-2 target nucleic acids are NOT DETECTED.  The SARS-CoV-2 RNA is generally detectable in upper respiratory specimens during the acute phase of infection. The lowest concentration of SARS-CoV-2 viral copies this assay can detect is 138 copies/mL. A negative result does not preclude SARS-Cov-2 infection and should not be used as the sole basis for treatment or other patient management decisions. A negative result may occur with  improper specimen collection/handling, submission of specimen other than nasopharyngeal swab, presence of viral mutation(s) within the areas targeted by this assay, and inadequate number of viral copies(<138 copies/mL). A negative result must be combined with clinical observations, patient history, and epidemiological information. The expected result is Negative.  Fact Sheet for Patients:  bloggercourse.com  Fact Sheet for Healthcare  Providers:  seriousbroker.it  This test is no t yet approved or cleared by the [place]  FDA and  has been authorized for detection and/or diagnosis of SARS-CoV-2 by FDA under an Emergency Use Authorization (EUA). This EUA will remain  in effect (meaning this test can be used) for the duration of the COVID-19 declaration under Section 564(b)(1) of the Act, 21 U.S.C.section 360bbb-3(b)(1), unless the authorization is terminated  or revoked sooner.       Influenza A by PCR POSITIVE (A) NEGATIVE Final   Influenza B by PCR NEGATIVE NEGATIVE Final    Comment: (NOTE) The Xpert Xpress SARS-CoV-2/FLU/RSV plus assay is intended as an aid in the diagnosis of influenza from Nasopharyngeal swab specimens and should not be used as a sole basis for treatment. Nasal washings and aspirates are  unacceptable for Xpert Xpress SARS-CoV-2/FLU/RSV testing.  Fact Sheet for Patients: bloggercourse.com  Fact Sheet for Healthcare Providers: seriousbroker.it  This test is not yet approved or cleared by the [place]  FDA and has been authorized for detection and/or diagnosis of SARS-CoV-2 by FDA under an Emergency Use Authorization (EUA). This EUA will remain in effect (meaning this test can be used) for the duration of the COVID-19 declaration under Section 564(b)(1) of the Act, 21 U.S.C. section 360bbb-3(b)(1), unless the authorization is terminated or revoked.     Resp Syncytial Virus by PCR NEGATIVE NEGATIVE Final    Comment: (NOTE) Fact Sheet for Patients: bloggercourse.com  Fact Sheet for Healthcare Providers: seriousbroker.it  This test is not yet approved or cleared by the [place]  FDA and has been authorized for detection and/or diagnosis of SARS-CoV-2 by FDA under an Emergency Use Authorization (EUA). This EUA will remain in effect (meaning this test can be used) for the duration of the COVID-19 declaration under Section 564(b)(1) of the Act, 21 U.S.C. section 360bbb-3(b)(1), unless the authorization is terminated or revoked.  Performed at [facility], [address], [place], [place] [address]   Resp panel by RT-PCR (RSV, Flu A&B, Covid) Anterior Nasal Swab     Status: Abnormal   Collection Time: 05/21/24 12:46 AM   Specimen: Anterior Nasal Swab  Result Value Ref Range Status   SARS Coronavirus 2 by RT PCR NEGATIVE NEGATIVE Final    Comment: (NOTE) SARS-CoV-2 target nucleic acids are NOT DETECTED.  The SARS-CoV-2 RNA is generally detectable in upper respiratory specimens during the acute phase of infection. The lowest concentration of SARS-CoV-2 viral copies this assay can detect is 138 copies/mL. A negative result does not preclude SARS-Cov-2 infection  and should not be used as the sole basis for treatment or other patient management decisions. A negative result may occur with  improper specimen collection/handling, submission of specimen other than nasopharyngeal swab, presence of viral mutation(s) within the areas targeted by this assay, and inadequate number of viral copies(<138 copies/mL). A negative result must be combined with clinical observations, patient history, and epidemiological information. The expected result is Negative.  Fact Sheet for Patients:  bloggercourse.com  Fact Sheet for Healthcare Providers:  seriousbroker.it  This test is no t yet approved or cleared by the [place]  FDA and  has been authorized for detection and/or diagnosis of SARS-CoV-2 by FDA under an Emergency Use Authorization (EUA). This EUA will remain  in effect (meaning this test can be used) for the duration of the COVID-19 declaration under Section 564(b)(1) of the Act, 21 U.S.C.section 360bbb-3(b)(1), unless the authorization is terminated  or revoked sooner.  Influenza A by PCR POSITIVE (A) NEGATIVE Final   Influenza B by PCR NEGATIVE NEGATIVE Final    Comment: (NOTE) The Xpert Xpress SARS-CoV-2/FLU/RSV plus assay is intended as an aid in the diagnosis of influenza from Nasopharyngeal swab specimens and should not be used as a sole basis for treatment. Nasal washings and aspirates are unacceptable for Xpert Xpress SARS-CoV-2/FLU/RSV testing.  Fact Sheet for Patients: bloggercourse.com  Fact Sheet for Healthcare Providers: seriousbroker.it  This test is not yet approved or cleared by the [place]  FDA and has been authorized for detection and/or diagnosis of SARS-CoV-2 by FDA under an Emergency Use Authorization (EUA). This EUA will remain in effect (meaning this test can be used) for the duration of the COVID-19  declaration under Section 564(b)(1) of the Act, 21 U.S.C. section 360bbb-3(b)(1), unless the authorization is terminated or revoked.     Resp Syncytial Virus by PCR NEGATIVE NEGATIVE Final    Comment: (NOTE) Fact Sheet for Patients: bloggercourse.com  Fact Sheet for Healthcare Providers: seriousbroker.it  This test is not yet approved or cleared by the [place]  FDA and has been authorized for detection and/or diagnosis of SARS-CoV-2 by FDA under an Emergency Use Authorization (EUA). This EUA will remain in effect (meaning this test can be used) for the duration of the COVID-19 declaration under Section 564(b)(1) of the Act, 21 U.S.C. section 360bbb-3(b)(1), unless the authorization is terminated or revoked.  Performed at [facility], [address], [place], [place] [address]   Resp panel by RT-PCR (RSV, Flu A&B, Covid) Anterior Nasal Swab     Status: Abnormal   Collection Time: 05/23/24 10:36 AM   Specimen: Anterior Nasal Swab  Result Value Ref Range Status   SARS Coronavirus 2 by RT PCR NEGATIVE NEGATIVE Final    Comment: (NOTE) SARS-CoV-2 target nucleic acids are NOT DETECTED.  The SARS-CoV-2 RNA is generally detectable in upper respiratory specimens during the acute phase of infection. The lowest concentration of SARS-CoV-2 viral copies this assay can detect is 138 copies/mL. A negative result does not preclude SARS-Cov-2 infection and should not be used as the sole basis for treatment or other patient management decisions. A negative result may occur with  improper specimen collection/handling, submission of specimen other than nasopharyngeal swab, presence of viral mutation(s) within the areas targeted by this assay, and inadequate number of viral copies(<138 copies/mL). A negative result must be combined with clinical observations, patient history, and epidemiological information. The expected result is  Negative.  Fact Sheet for Patients:  bloggercourse.com  Fact Sheet for Healthcare Providers:  seriousbroker.it  This test is no t yet approved or cleared by the [place]  FDA and  has been authorized for detection and/or diagnosis of SARS-CoV-2 by FDA under an Emergency Use Authorization (EUA). This EUA will remain  in effect (meaning this test can be used) for the duration of the COVID-19 declaration under Section 564(b)(1) of the Act, 21 U.S.C.section 360bbb-3(b)(1), unless the authorization is terminated  or revoked sooner.       Influenza A by PCR POSITIVE (A) NEGATIVE Final   Influenza B by PCR NEGATIVE NEGATIVE Final    Comment: (NOTE) The Xpert Xpress SARS-CoV-2/FLU/RSV plus assay is intended as an aid in the diagnosis of influenza from Nasopharyngeal swab specimens and should not be used as a sole basis for treatment. Nasal washings and aspirates are unacceptable for Xpert Xpress SARS-CoV-2/FLU/RSV testing.  Fact Sheet for Patients: bloggercourse.com  Fact Sheet for Healthcare Providers: seriousbroker.it  This test is  not yet approved or cleared by the [place]  FDA and has been authorized for detection and/or diagnosis of SARS-CoV-2 by FDA under an Emergency Use Authorization (EUA). This EUA will remain in effect (meaning this test can be used) for the duration of the COVID-19 declaration under Section 564(b)(1) of the Act, 21 U.S.C. section 360bbb-3(b)(1), unless the authorization is terminated or revoked.     Resp Syncytial Virus by PCR NEGATIVE NEGATIVE Final    Comment: (NOTE) Fact Sheet for Patients: bloggercourse.com  Fact Sheet for Healthcare Providers: seriousbroker.it  This test is not yet approved or cleared by the [place]  FDA and has been authorized for detection and/or diagnosis of  SARS-CoV-2 by FDA under an Emergency Use Authorization (EUA). This EUA will remain in effect (meaning this test can be used) for the duration of the COVID-19 declaration under Section 564(b)(1) of the Act, 21 U.S.C. section 360bbb-3(b)(1), unless the authorization is terminated or revoked.  Performed at [facility], [address] [place], [place] [address]          Radiology Studies: No results found.         LOS: 17 days   Time spent= 35 mins    Deliliah Room, MD [facility]  If 7PM-7AM, please contact night-coverage  05/28/2024, 9:01 AM  "

## 2024-05-29 NOTE — Progress Notes (Signed)
 " [name]  FMW:[id number] DOB: [birth year] DOA: 05/11/2024 PCP: [name], MD   Brief Narrative:   89 y.o. male with medical history significant for CKD 3, hypertension, gout. Patient was brought to the ED via EMS with reports of 1 week of progressive generalized weakness, sore throat, cough, nausea and vomiting and lower abdominal pain.  Over the past week he has barely had any oral intake, he has had vomiting with every oral intake, and has not been able to stay hydrated.  Patient was admitted with acute hypoxemic respiratory failure secondary to influenza A infection with likely superimposed pneumonia.  He also has new onset atrial fibrillation with RVR.  He currently continues to have significant amounts of weakness and poor oral intake with hypernatremia.  Transitioned to full comfort care on 05/18/24. Residential hospice/[facility] unable to take patient due to positive flu test   Medical director at [facility] denied residential hospice, will reassess on Monday. Family can't take him home.   Needs SNF  Assessment & Plan:  Principal Problem:   Hypernatremia Active Problems:   Influenza A   Acute hypoxic respiratory failure (HCC)   Atrial fibrillation with RVR (HCC)   -influenza A infection with presumed superimposed pneumonia - Treatment with Tamiflu  completed -Completed 5 days of appropriate antibiotics for pneumonia --At family request transitioned patient to full comfort care on 05/18/2024 -- continues to be unable to eat enough to sustain himself -Patient without fevers or any other influenza symptoms for several days now -Repeat testing done on 1/2 is still positive for Flu A   Acute hypoxic respiratory failure - Secondary to above -Hypoxia has resolved patient has been weaned off oxygen completely - Comfort care    New onset atrial fibrillation with RVR:  - 2D echo demonstrating preserved ejection fraction and no significant valvular  disorder. -- Comfort care   Chronic kidney disease stage IIIb - Appears to be close to his baseline,  -- On comfort care   Hypertension   Severe protein calorie malnutrition -Body mass index is 14.68 kg/m.  -On comfort care now  Hypernatremia - Resolved   Generalized weakness/physical deconditioning/anorexia -- Comfort care now   Disposition: Hospice residential facility didn't approve.Needs SNF, awaiting bed availability.   DVT prophylaxis: Not indicated     Code Status: Do not attempt resuscitation (DNR) - Comfort care Family Communication:  None at the bedside Status is: Inpatient Remains inpatient appropriate because: on comfort care    Subjective:  No acute events overnight. Denied any active complaints.  Nasal oxygen cannula is in place  Examination:  General exam: alert and awake Respiratory system: Clear to auscultation. Respiratory effort normal. Cardiovascular system: S1 & S2 heard, RRR. No JVD, murmurs, rubs, gallops or clicks. No pedal edema. Gastrointestinal system: Abdomen is nondistended, soft and nontender. No organomegaly or masses felt. Normal bowel sounds heard. Central nervous system: alert and awake. No obvious focal deficits       Diet Orders (From admission, onward)     Start     Ordered   05/13/24 1449  DIET - DYS 1 Room service appropriate? Yes; Fluid consistency: Thin  Diet effective now       Question Answer Comment  Room service appropriate? Yes   Fluid consistency: Thin      05/13/24 1449            Objective: Vitals:   05/27/24 2000 05/28/24 0437 05/28/24 1400 05/28/24 1921  BP: 107/70  123/89 113/77 96/70  Pulse: 64 (!) 126 (!) 127 (!) 132  Resp: 20 19 20 16   Temp: 97.7 F (36.5 C) (!) 97.4 F (36.3 C) (!) 97.4 F (36.3 C) 97.6 F (36.4 C)  TempSrc: Oral Oral Oral Axillary  SpO2: 94% 94% 100% 96%  Weight:      Height:        Intake/Output Summary (Last 24 hours) at 05/29/2024 1038 Last data filed at  05/29/2024 0830 Gross per 24 hour  Intake 100 ml  Output 100 ml  Net 0 ml   Filed Weights   05/11/24 1710  Weight: 46.4 kg    Scheduled Meds:  aspirin   81 mg Oral Daily   feeding supplement  237 mL Oral BID BM   metoprolol  tartrate  25 mg Oral BID   nystatin    Topical BID   Continuous Infusions:  Nutritional status     Body mass index is 14.68 kg/m.  Data Reviewed:   CBC: Recent Labs  Lab 05/23/24 1049  WBC 7.7  NEUTROABS 5.9  HGB 12.5*  HCT 40.2  MCV 99.3  PLT 260    Basic Metabolic Panel: Recent Labs  Lab 05/23/24 1049  NA 145  K 4.2  CL 112*  CO2 19*  GLUCOSE 104*  BUN 30*  CREATININE 1.48*  CALCIUM  8.6*    GFR: Estimated Creatinine Clearance: 17.4 mL/min (A) (by C-G formula based on SCr of 1.48 mg/dL (H)). Liver Function Tests: No results for input(s): AST, ALT, ALKPHOS, BILITOT, PROT, ALBUMIN in the last 168 hours. No results for input(s): LIPASE, AMYLASE in the last 168 hours. No results for input(s): AMMONIA in the last 168 hours. Coagulation Profile: No results for input(s): INR, PROTIME in the last 168 hours. Cardiac Enzymes: No results for input(s): CKTOTAL, CKMB, CKMBINDEX, TROPONINI in the last 168 hours. BNP (last 3 results) No results for input(s): PROBNP in the last 8760 hours. HbA1C: No results for input(s): HGBA1C in the last 72 hours. CBG: No results for input(s): GLUCAP in the last 168 hours.  Lipid Profile: No results for input(s): CHOL, HDL, LDLCALC, TRIG, CHOLHDL, LDLDIRECT in the last 72 hours. Thyroid  Function Tests: No results for input(s): TSH, T4TOTAL, FREET4, T3FREE, THYROIDAB in the last 72 hours. Anemia Panel: No results for input(s): VITAMINB12, FOLATE, FERRITIN, TIBC, IRON, RETICCTPCT in the last 72 hours. Sepsis Labs: No results for input(s): PROCALCITON, LATICACIDVEN in the last 168 hours.   Recent Results (from the past 240 hours)   Resp panel by RT-PCR (RSV, Flu A&B, Covid) Anterior Nasal Swab     Status: Abnormal   Collection Time: 05/19/24 11:46 AM   Specimen: Anterior Nasal Swab  Result Value Ref Range Status   SARS Coronavirus 2 by RT PCR NEGATIVE NEGATIVE Final    Comment: (NOTE) SARS-CoV-2 target nucleic acids are NOT DETECTED.  The SARS-CoV-2 RNA is generally detectable in upper respiratory specimens during the acute phase of infection. The lowest concentration of SARS-CoV-2 viral copies this assay can detect is 138 copies/mL. A negative result does not preclude SARS-Cov-2 infection and should not be used as the sole basis for treatment or other patient management decisions. A negative result may occur with  improper specimen collection/handling, submission of specimen other than nasopharyngeal swab, presence of viral mutation(s) within the areas targeted by this assay, and inadequate number of viral copies(<138 copies/mL). A negative result must be combined with clinical observations, patient history, and epidemiological information. The expected result is Negative.  Fact Sheet for  Patients:  bloggercourse.com  Fact Sheet for Healthcare Providers:  seriousbroker.it  This test is no t yet approved or cleared by the [place]  FDA and  has been authorized for detection and/or diagnosis of SARS-CoV-2 by FDA under an Emergency Use Authorization (EUA). This EUA will remain  in effect (meaning this test can be used) for the duration of the COVID-19 declaration under Section 564(b)(1) of the Act, 21 U.S.C.section 360bbb-3(b)(1), unless the authorization is terminated  or revoked sooner.       Influenza A by PCR POSITIVE (A) NEGATIVE Final   Influenza B by PCR NEGATIVE NEGATIVE Final    Comment: (NOTE) The Xpert Xpress SARS-CoV-2/FLU/RSV plus assay is intended as an aid in the diagnosis of influenza from Nasopharyngeal swab specimens and should not  be used as a sole basis for treatment. Nasal washings and aspirates are unacceptable for Xpert Xpress SARS-CoV-2/FLU/RSV testing.  Fact Sheet for Patients: bloggercourse.com  Fact Sheet for Healthcare Providers: seriousbroker.it  This test is not yet approved or cleared by the [place]  FDA and has been authorized for detection and/or diagnosis of SARS-CoV-2 by FDA under an Emergency Use Authorization (EUA). This EUA will remain in effect (meaning this test can be used) for the duration of the COVID-19 declaration under Section 564(b)(1) of the Act, 21 U.S.C. section 360bbb-3(b)(1), unless the authorization is terminated or revoked.     Resp Syncytial Virus by PCR NEGATIVE NEGATIVE Final    Comment: (NOTE) Fact Sheet for Patients: bloggercourse.com  Fact Sheet for Healthcare Providers: seriousbroker.it  This test is not yet approved or cleared by the [place]  FDA and has been authorized for detection and/or diagnosis of SARS-CoV-2 by FDA under an Emergency Use Authorization (EUA). This EUA will remain in effect (meaning this test can be used) for the duration of the COVID-19 declaration under Section 564(b)(1) of the Act, 21 U.S.C. section 360bbb-3(b)(1), unless the authorization is terminated or revoked.  Performed at [facility], [address] [place], [place] [address]   Resp panel by RT-PCR (RSV, Flu A&B, Covid) Anterior Nasal Swab     Status: Abnormal   Collection Time: 05/21/24 12:46 AM   Specimen: Anterior Nasal Swab  Result Value Ref Range Status   SARS Coronavirus 2 by RT PCR NEGATIVE NEGATIVE Final    Comment: (NOTE) SARS-CoV-2 target nucleic acids are NOT DETECTED.  The SARS-CoV-2 RNA is generally detectable in upper respiratory specimens during the acute phase of infection. The lowest concentration of SARS-CoV-2 viral copies this assay can detect  is 138 copies/mL. A negative result does not preclude SARS-Cov-2 infection and should not be used as the sole basis for treatment or other patient management decisions. A negative result may occur with  improper specimen collection/handling, submission of specimen other than nasopharyngeal swab, presence of viral mutation(s) within the areas targeted by this assay, and inadequate number of viral copies(<138 copies/mL). A negative result must be combined with clinical observations, patient history, and epidemiological information. The expected result is Negative.  Fact Sheet for Patients:  bloggercourse.com  Fact Sheet for Healthcare Providers:  seriousbroker.it  This test is no t yet approved or cleared by the [place]  FDA and  has been authorized for detection and/or diagnosis of SARS-CoV-2 by FDA under an Emergency Use Authorization (EUA). This EUA will remain  in effect (meaning this test can be used) for the duration of the COVID-19 declaration under Section 564(b)(1) of the Act, 21 U.S.C.section 360bbb-3(b)(1), unless the authorization is terminated  or  revoked sooner.       Influenza A by PCR POSITIVE (A) NEGATIVE Final   Influenza B by PCR NEGATIVE NEGATIVE Final    Comment: (NOTE) The Xpert Xpress SARS-CoV-2/FLU/RSV plus assay is intended as an aid in the diagnosis of influenza from Nasopharyngeal swab specimens and should not be used as a sole basis for treatment. Nasal washings and aspirates are unacceptable for Xpert Xpress SARS-CoV-2/FLU/RSV testing.  Fact Sheet for Patients: bloggercourse.com  Fact Sheet for Healthcare Providers: seriousbroker.it  This test is not yet approved or cleared by the [place]  FDA and has been authorized for detection and/or diagnosis of SARS-CoV-2 by FDA under an Emergency Use Authorization (EUA). This EUA will remain in  effect (meaning this test can be used) for the duration of the COVID-19 declaration under Section 564(b)(1) of the Act, 21 U.S.C. section 360bbb-3(b)(1), unless the authorization is terminated or revoked.     Resp Syncytial Virus by PCR NEGATIVE NEGATIVE Final    Comment: (NOTE) Fact Sheet for Patients: bloggercourse.com  Fact Sheet for Healthcare Providers: seriousbroker.it  This test is not yet approved or cleared by the [place]  FDA and has been authorized for detection and/or diagnosis of SARS-CoV-2 by FDA under an Emergency Use Authorization (EUA). This EUA will remain in effect (meaning this test can be used) for the duration of the COVID-19 declaration under Section 564(b)(1) of the Act, 21 U.S.C. section 360bbb-3(b)(1), unless the authorization is terminated or revoked.  Performed at [facility], [address], [place], [place] [address]   Resp panel by RT-PCR (RSV, Flu A&B, Covid) Anterior Nasal Swab     Status: Abnormal   Collection Time: 05/23/24 10:36 AM   Specimen: Anterior Nasal Swab  Result Value Ref Range Status   SARS Coronavirus 2 by RT PCR NEGATIVE NEGATIVE Final    Comment: (NOTE) SARS-CoV-2 target nucleic acids are NOT DETECTED.  The SARS-CoV-2 RNA is generally detectable in upper respiratory specimens during the acute phase of infection. The lowest concentration of SARS-CoV-2 viral copies this assay can detect is 138 copies/mL. A negative result does not preclude SARS-Cov-2 infection and should not be used as the sole basis for treatment or other patient management decisions. A negative result may occur with  improper specimen collection/handling, submission of specimen other than nasopharyngeal swab, presence of viral mutation(s) within the areas targeted by this assay, and inadequate number of viral copies(<138 copies/mL). A negative result must be combined with clinical observations, patient  history, and epidemiological information. The expected result is Negative.  Fact Sheet for Patients:  bloggercourse.com  Fact Sheet for Healthcare Providers:  seriousbroker.it  This test is no t yet approved or cleared by the [place]  FDA and  has been authorized for detection and/or diagnosis of SARS-CoV-2 by FDA under an Emergency Use Authorization (EUA). This EUA will remain  in effect (meaning this test can be used) for the duration of the COVID-19 declaration under Section 564(b)(1) of the Act, 21 U.S.C.section 360bbb-3(b)(1), unless the authorization is terminated  or revoked sooner.       Influenza A by PCR POSITIVE (A) NEGATIVE Final   Influenza B by PCR NEGATIVE NEGATIVE Final    Comment: (NOTE) The Xpert Xpress SARS-CoV-2/FLU/RSV plus assay is intended as an aid in the diagnosis of influenza from Nasopharyngeal swab specimens and should not be used as a sole basis for treatment. Nasal washings and aspirates are unacceptable for Xpert Xpress SARS-CoV-2/FLU/RSV testing.  Fact Sheet for Patients: bloggercourse.com  Fact Sheet  for Healthcare Providers: seriousbroker.it  This test is not yet approved or cleared by the [place]  FDA and has been authorized for detection and/or diagnosis of SARS-CoV-2 by FDA under an Emergency Use Authorization (EUA). This EUA will remain in effect (meaning this test can be used) for the duration of the COVID-19 declaration under Section 564(b)(1) of the Act, 21 U.S.C. section 360bbb-3(b)(1), unless the authorization is terminated or revoked.     Resp Syncytial Virus by PCR NEGATIVE NEGATIVE Final    Comment: (NOTE) Fact Sheet for Patients: bloggercourse.com  Fact Sheet for Healthcare Providers: seriousbroker.it  This test is not yet approved or cleared by the [place]   FDA and has been authorized for detection and/or diagnosis of SARS-CoV-2 by FDA under an Emergency Use Authorization (EUA). This EUA will remain in effect (meaning this test can be used) for the duration of the COVID-19 declaration under Section 564(b)(1) of the Act, 21 U.S.C. section 360bbb-3(b)(1), unless the authorization is terminated or revoked.  Performed at [facility], [address] [place], [place] [address]          Radiology Studies: No results found.         LOS: 18 days   Time spent= 35 mins    Deliliah Room, MD [facility]  If 7PM-7AM, please contact night-coverage  05/29/2024, 10:38 AM  "

## 2024-05-29 NOTE — TOC Progression Note (Signed)
 Transition of Care [facility]) - Progression Note    Patient Details  Name: TRISTAIN DAILY MRN: [id number] Date of Birth: 04-21-1922  [facility]) CM/SW Contact  Hoy DELENA Bigness, LCSW Phone Number: 05/29/2024, 11:22 AM  Clinical Narrative:    Currently no male beds available for placement at [facility] and [facility]. Awaiting response from Isaiah w/ [facility] to determine if they are able to accept.    Expected Discharge Plan: Skilled Nursing Facility Barriers to Discharge: No SNF bed               Expected Discharge Plan and Services In-house Referral: Clinical Social Work Discharge Planning Services: CM Consult Post Acute Care Choice: Durable Medical Equipment Living arrangements for the past 2 months: Single Family Home Expected Discharge Date: 05/19/24               DME Arranged: Oxygen DME Agency: [employer] Date DME Agency Contacted: 05/13/24 Time DME Agency Contacted: 1525 Representative spoke with at DME Agency: Darlyn             Social Drivers of Health (SDOH) Interventions SDOH Screenings   Food Insecurity: Patient Unable To Answer (05/11/2024)  Housing: Unknown (05/11/2024)  Transportation Needs: Patient Unable To Answer (05/11/2024)  Utilities: Patient Unable To Answer (05/11/2024)  Social Connections: Patient Unable To Answer (05/11/2024)  Tobacco Use: High Risk (05/11/2024)    Readmission Risk Interventions    05/27/2024    2:33 PM 05/14/2024   12:50 PM  Readmission Risk Prevention Plan  Transportation Screening Complete Complete  Home Care Screening Complete Complete  Medication Review (RN CM) Complete Complete

## 2024-05-30 DIAGNOSIS — E870 Hyperosmolality and hypernatremia: Secondary | ICD-10-CM | POA: Diagnosis not present

## 2024-05-30 NOTE — Plan of Care (Signed)

## 2024-05-30 NOTE — Progress Notes (Signed)
 " PROGRESS NOTE  Hector Lowe  FMW:[id number] DOB: 25-Aug-1921 DOA: 05/11/2024 PCP: Hector Lowe  Consultants  Brief Narrative:   89 y.o. male with medical history significant for CKD 3, hypertension, gout. Patient was brought to the ED via EMS with reports of 1 week of progressive generalized weakness, sore throat, cough, nausea and vomiting and lower abdominal pain.  Over the past week he has barely had any oral intake, he has had vomiting with every oral intake, and has not been able to stay hydrated.  Patient was admitted with acute hypoxemic respiratory failure secondary to influenza A infection with likely superimposed pneumonia.  He also has new onset atrial fibrillation with RVR.  He currently continues to have significant amounts of weakness and poor oral intake with hypernatremia.  Transitioned to full comfort care on 05/18/24. Residential hospice/[facility] unable to take patient due to positive flu test   Medical director at [facility] denied residential hospice, will reassess on Monday. Family can't take him home.   Needs SNF  Assessment & Plan:  Principal Problem:   Hypernatremia Active Problems:   Influenza A   Acute hypoxic respiratory failure (HCC)   Atrial fibrillation with RVR (HCC)   -influenza A infection with presumed superimposed pneumonia - Treatment with Tamiflu  completed -Completed 5 days of appropriate antibiotics for pneumonia --At family request transitioned patient to full comfort care on 05/18/2024 -- continues to be unable to eat enough to sustain himself -Patient without fevers or any other influenza symptoms for several days now -Repeat testing done on 1/2 is still positive for Flu A   Acute hypoxic respiratory failure - Secondary to above -Hypoxia has resolved patient has been weaned off oxygen completely - Comfort care    New onset atrial fibrillation with RVR:  - 2D echo demonstrating preserved ejection fraction and no significant  valvular disorder. -- Comfort care   Chronic kidney disease stage IIIb - Appears to be close to his baseline,  -- On comfort care   Hypertension - on comfort care, not following vitals   Severe protein calorie malnutrition -Body mass index is 14.68 kg/m.  -On comfort care now  Hypernatremia - Resolved   Generalized weakness/physical deconditioning/anorexia -- Comfort care now   Disposition: Hospice residential facility didn't approve.Needs SNF, awaiting bed availability.   DVT prophylaxis: Not indicated     Code Status: Do not attempt resuscitation (DNR) - Comfort care Family Communication:  None at the bedside Status is: Inpatient Remains inpatient appropriate because: on comfort care    Subjective:  No acute events overnight. Denied any active complaints, awake and alert on my exam.  Nasal oxygen cannula is in place  Objective: Vitals:   05/28/24 0437 05/28/24 1400 05/28/24 1921 05/29/24 2028  BP: 123/89 113/77 96/70 109/71  Pulse: (!) 126 (!) 127 (!) 132 (!) 134  Resp: 19 20 16 18   Temp: (!) 97.4 F (36.3 C) (!) 97.4 F (36.3 C) 97.6 F (36.4 C) (!) 97.5 F (36.4 C)  TempSrc: Oral Oral Axillary Oral  SpO2: 94% 100% 96% 100%  Weight:      Height:        Intake/Output Summary (Last 24 hours) at 05/30/2024 0820 Last data filed at 05/29/2024 1051 Gross per 24 hour  Intake 60 ml  Output 100 ml  Net -40 ml   Filed Weights   05/11/24 1710  Weight: 46.4 kg   Body mass index is 14.68 kg/m.  Gen: 89 y.o. male in no apparent  distress.  Nontoxic Pulm: Non-labored breathing.  Clear to auscultation bilaterally.  CV: Regular rate and rhythm. No murmur, rub, or gallop. No JVD GI: Abdomen soft, non-tender, non-distended, with normoactive bowel sounds. No organomegaly or masses felt. Ext: Warm, no deformities,  Neuro: Alert and oriented. No focal neurological deficits. Psych: Calm  Judgement and insight appear normal. Mood & affect appropriate.     I have  personally reviewed the following labs and images: CBC: Recent Labs  Lab 05/23/24 1049  WBC 7.7  NEUTROABS 5.9  HGB 12.5*  HCT 40.2  MCV 99.3  PLT 260   BMP &GFR Recent Labs  Lab 05/23/24 1049  NA 145  K 4.2  CL 112*  CO2 19*  GLUCOSE 104*  BUN 30*  CREATININE 1.48*  CALCIUM  8.6*   Estimated Creatinine Clearance: 17.4 mL/min (A) (by C-G formula based on SCr of 1.48 mg/dL (H)). Liver & Pancreas: No results for input(s): AST, ALT, ALKPHOS, BILITOT, PROT, ALBUMIN in the last 168 hours. No results for input(s): LIPASE, AMYLASE in the last 168 hours. No results for input(s): AMMONIA in the last 168 hours. Diabetic: No results for input(s): HGBA1C in the last 72 hours. No results for input(s): GLUCAP in the last 168 hours. Cardiac Enzymes: No results for input(s): CKTOTAL, CKMB, CKMBINDEX, TROPONINI in the last 168 hours. No results for input(s): PROBNP in the last 8760 hours. Coagulation Profile: No results for input(s): INR, PROTIME in the last 168 hours. Thyroid  Function Tests: No results for input(s): TSH, T4TOTAL, FREET4, T3FREE, THYROIDAB in the last 72 hours. Lipid Profile: No results for input(s): CHOL, HDL, LDLCALC, TRIG, CHOLHDL, LDLDIRECT in the last 72 hours. Anemia Panel: No results for input(s): VITAMINB12, FOLATE, FERRITIN, TIBC, IRON, RETICCTPCT in the last 72 hours. Urine analysis:    Component Value Date/Time   COLORURINE AMBER (A) 05/11/2024 1410   APPEARANCEUR HAZY (A) 05/11/2024 1410   LABSPEC 1.023 05/11/2024 1410   PHURINE 5.0 05/11/2024 1410   GLUCOSEU NEGATIVE 05/11/2024 1410   HGBUR SMALL (A) 05/11/2024 1410   BILIRUBINUR NEGATIVE 05/11/2024 1410   KETONESUR 5 (A) 05/11/2024 1410   PROTEINUR 100 (A) 05/11/2024 1410   NITRITE NEGATIVE 05/11/2024 1410   LEUKOCYTESUR NEGATIVE 05/11/2024 1410   Sepsis Labs: Invalid input(s): PROCALCITONIN,  LACTICIDVEN  Microbiology: Recent Results (from the past 240 hours)  Resp panel by RT-PCR (RSV, Flu A&B, Covid) Anterior Nasal Swab     Status: Abnormal   Collection Time: 05/21/24 12:46 AM   Specimen: Anterior Nasal Swab  Result Value Ref Range Status   SARS Coronavirus 2 by RT PCR NEGATIVE NEGATIVE Final    Comment: (NOTE) SARS-CoV-2 target nucleic acids are NOT DETECTED.  The SARS-CoV-2 RNA is generally detectable in upper respiratory specimens during the acute phase of infection. The lowest concentration of SARS-CoV-2 viral copies this assay can detect is 138 copies/mL. A negative result does not preclude SARS-Cov-2 infection and should not be used as the sole basis for treatment or other patient management decisions. A negative result may occur with  improper specimen collection/handling, submission of specimen other than nasopharyngeal swab, presence of viral mutation(s) within the areas targeted by this assay, and inadequate number of viral copies(<138 copies/mL). A negative result must be combined with clinical observations, patient history, and epidemiological information. The expected result is Negative.  Fact Sheet for Patients:  bloggercourse.com  Fact Sheet for Healthcare Providers:  seriousbroker.it  This test is no t yet approved or cleared by the [place]  FDA and  has been authorized for detection and/or diagnosis of SARS-CoV-2 by FDA under an Emergency Use Authorization (EUA). This EUA will remain  in effect (meaning this test can be used) for the duration of the COVID-19 declaration under Section 564(b)(1) of the Act, 21 U.S.C.section 360bbb-3(b)(1), unless the authorization is terminated  or revoked sooner.       Influenza A by PCR POSITIVE (A) NEGATIVE Final   Influenza B by PCR NEGATIVE NEGATIVE Final    Comment: (NOTE) The Xpert Xpress SARS-CoV-2/FLU/RSV plus assay is intended as an aid in the  diagnosis of influenza from Nasopharyngeal swab specimens and should not be used as a sole basis for treatment. Nasal washings and aspirates are unacceptable for Xpert Xpress SARS-CoV-2/FLU/RSV testing.  Fact Sheet for Patients: bloggercourse.com  Fact Sheet for Healthcare Providers: seriousbroker.it  This test is not yet approved or cleared by the [place]  FDA and has been authorized for detection and/or diagnosis of SARS-CoV-2 by FDA under an Emergency Use Authorization (EUA). This EUA will remain in effect (meaning this test can be used) for the duration of the COVID-19 declaration under Section 564(b)(1) of the Act, 21 U.S.C. section 360bbb-3(b)(1), unless the authorization is terminated or revoked.     Resp Syncytial Virus by PCR NEGATIVE NEGATIVE Final    Comment: (NOTE) Fact Sheet for Patients: bloggercourse.com  Fact Sheet for Healthcare Providers: seriousbroker.it  This test is not yet approved or cleared by the [place]  FDA and has been authorized for detection and/or diagnosis of SARS-CoV-2 by FDA under an Emergency Use Authorization (EUA). This EUA will remain in effect (meaning this test can be used) for the duration of the COVID-19 declaration under Section 564(b)(1) of the Act, 21 U.S.C. section 360bbb-3(b)(1), unless the authorization is terminated or revoked.  Performed at [facility], [address], [place], [place] [address]   Resp panel by RT-PCR (RSV, Flu A&B, Covid) Anterior Nasal Swab     Status: Abnormal   Collection Time: 05/23/24 10:36 AM   Specimen: Anterior Nasal Swab  Result Value Ref Range Status   SARS Coronavirus 2 by RT PCR NEGATIVE NEGATIVE Final    Comment: (NOTE) SARS-CoV-2 target nucleic acids are NOT DETECTED.  The SARS-CoV-2 RNA is generally detectable in upper respiratory specimens during the acute phase of infection. The  lowest concentration of SARS-CoV-2 viral copies this assay can detect is 138 copies/mL. A negative result does not preclude SARS-Cov-2 infection and should not be used as the sole basis for treatment or other patient management decisions. A negative result may occur with  improper specimen collection/handling, submission of specimen other than nasopharyngeal swab, presence of viral mutation(s) within the areas targeted by this assay, and inadequate number of viral copies(<138 copies/mL). A negative result must be combined with clinical observations, patient history, and epidemiological information. The expected result is Negative.  Fact Sheet for Patients:  bloggercourse.com  Fact Sheet for Healthcare Providers:  seriousbroker.it  This test is no t yet approved or cleared by the [place]  FDA and  has been authorized for detection and/or diagnosis of SARS-CoV-2 by FDA under an Emergency Use Authorization (EUA). This EUA will remain  in effect (meaning this test can be used) for the duration of the COVID-19 declaration under Section 564(b)(1) of the Act, 21 U.S.C.section 360bbb-3(b)(1), unless the authorization is terminated  or revoked sooner.       Influenza A by PCR POSITIVE (A) NEGATIVE Final   Influenza B by PCR NEGATIVE NEGATIVE Final  Comment: (NOTE) The Xpert Xpress SARS-CoV-2/FLU/RSV plus assay is intended as an aid in the diagnosis of influenza from Nasopharyngeal swab specimens and should not be used as a sole basis for treatment. Nasal washings and aspirates are unacceptable for Xpert Xpress SARS-CoV-2/FLU/RSV testing.  Fact Sheet for Patients: bloggercourse.com  Fact Sheet for Healthcare Providers: seriousbroker.it  This test is not yet approved or cleared by the [place]  FDA and has been authorized for detection and/or diagnosis of SARS-CoV-2 by FDA  under an Emergency Use Authorization (EUA). This EUA will remain in effect (meaning this test can be used) for the duration of the COVID-19 declaration under Section 564(b)(1) of the Act, 21 U.S.C. section 360bbb-3(b)(1), unless the authorization is terminated or revoked.     Resp Syncytial Virus by PCR NEGATIVE NEGATIVE Final    Comment: (NOTE) Fact Sheet for Patients: bloggercourse.com  Fact Sheet for Healthcare Providers: seriousbroker.it  This test is not yet approved or cleared by the [place]  FDA and has been authorized for detection and/or diagnosis of SARS-CoV-2 by FDA under an Emergency Use Authorization (EUA). This EUA will remain in effect (meaning this test can be used) for the duration of the COVID-19 declaration under Section 564(b)(1) of the Act, 21 U.S.C. section 360bbb-3(b)(1), unless the authorization is terminated or revoked.  Performed at [facility], [address], [place], [place] [address]     Radiology Studies: No results found.  Scheduled Meds:  aspirin   81 mg Oral Daily   feeding supplement  237 mL Oral BID BM   metoprolol  tartrate  25 mg Oral BID   nystatin    Topical BID   Continuous Infusions:   LOS: 19 days   35 minutes with more than 50% spent in reviewing records, counseling patient/family and coordinating care.  Hector VEAR Gaw, [facility] [email or web address] 05/30/2024, 8:20 AM    "

## 2024-05-30 NOTE — Progress Notes (Signed)
 Physical Therapy Treatment Patient Details Name: Hector Lowe MRN: [id number] DOB: 1922/05/08 Today's Date: 05/30/2024   History of Present Illness Hector Lowe is a 89 y.o. male with medical history significant for CKD 3, hypertension, gout.  Patient was brought to the ED via EMS with reports of 1 week of progressive generalized weakness, sore throat, cough, nausea and vomiting and lower abdominal pain.  Over the past week he has barely had any oral intake, he has had vomiting with every oral intake, and has not been able to stay hydrated.  He has also had cough productive of thick sputum.  No chest pain.  Per family, patient initially declined coming to the ED, today he was unable to get out of bed felt worse and so agreed to come in.  Saw his outpatient provider a few days ago, was diagnosed with a sinus infection, started on a course of penicillin.    PT Comments  Patient was agreeable with PT treatment session. Received supine in bed, pt required min assist to get EOB. During STS from bed with RW, pt was CGA/min assist. Transferred to chair initially with RW and min/CGA. Pt requested to get onto [facility] and continued to require min/CGA w/ RW with transfer to and from [facility] and chair. Once back into chair, patient completes seated LE exercises but is limited due to inc fatigue. Pt given print off of HEP. Pt required assist with fixing Nasal Cannula throughout session. Pt tolerates sitting in chair at end of session, alarm set, all needs met and call button in reach. Patient will benefit from continued skilled physical therapy acutely and in recommended venue in order to address current deficits and improve overall function.     If plan is discharge home, recommend the following: A lot of help with bathing/dressing/bathroom;A lot of help with walking and/or transfers;Help with stairs or ramp for entrance;Assist for transportation;Assistance with cooking/housework   Can travel by private vehicle         Equipment Recommendations  None recommended by PT    Recommendations for Other Services       Precautions / Restrictions Precautions Precautions: Fall Recall of Precautions/Restrictions: Intact Restrictions Weight Bearing Restrictions Per Provider Order: No     Mobility  Bed Mobility Overal bed mobility: Needs Assistance Bed Mobility: Supine to Sit     Supine to sit: Min assist     General bed mobility comments: labored movement, increased time, assisted via pull to sit while guarding trunk to get EOB    Transfers Overall transfer level: Needs assistance Equipment used: Rolling walker (2 wheels) Transfers: Sit to/from Stand, Bed to chair/wheelchair/BSC Sit to Stand: Contact guard assist, Min assist   Step pivot transfers: Min assist, Contact guard assist       General transfer comment: min assist with STS from bed, chair, and BSC during session, verbal cues for hand placement and stabilizing RW, slow labored movement throughout, pt becomes confused when transferring from chair to [facility], corrected with verbal cueing and  visual re-direction    Ambulation/Gait Ambulation/Gait assistance: Min assist Gait Distance (Feet): 10 Feet Assistive device: Rolling walker (2 wheels) Gait Pattern/deviations: Decreased step length - right, Decreased step length - left, Trunk flexed, Decreased stride length, Knees buckling Gait velocity: dec     General Gait Details: pt ambulates in room with RW from different seated surfaces, requires min assist for general weakness and unsteadiness, slow labored movement throughout   Stairs  Wheelchair Mobility     Tilt Bed    Modified Rankin (Stroke Patients Only)       Balance Overall balance assessment: Needs assistance Sitting-balance support: Feet supported, No upper extremity supported Sitting balance-Leahy Scale: Fair Sitting balance - Comments: fair/good seated at EOB   Standing balance support: Reliant  on assistive device for balance, During functional activity, Bilateral upper extremity supported Standing balance-Leahy Scale: Poor Standing balance comment: fair/poor using RW                            Communication Communication Communication: Impaired Factors Affecting Communication: Hearing impaired;Other (comment) (bilateral hearing aids)  Cognition Arousal: Alert Behavior During Therapy: WFL for tasks assessed/performed                             Following commands: Intact      Cueing Cueing Techniques: Verbal cues, Tactile cues, Gestural cues, Visual cues  Exercises General Exercises - Lower Extremity Long Arc Quad: AROM, Strengthening, Both, 10 reps, Seated Toe Raises: AROM, Strengthening, Both, 10 reps, Seated    General Comments        Pertinent Vitals/Pain Pain Assessment Pain Assessment: No/denies pain    Home Living                          Prior Function            PT Goals (current goals can now be found in the care plan section) Acute Rehab PT Goals Patient Stated Goal: return home after rehab PT Goal Formulation: With patient/family Time For Goal Achievement: 06/10/24 Potential to Achieve Goals: Good Progress towards PT goals: Progressing toward goals    Frequency    Min 3X/week      PT Plan      Co-evaluation              AM-PAC PT 6 Clicks Mobility   Outcome Measure  Help needed turning from your back to your side while in a flat bed without using bedrails?: A Little Help needed moving from lying on your back to sitting on the side of a flat bed without using bedrails?: A Little Help needed moving to and from a bed to a chair (including a wheelchair)?: A Lot Help needed standing up from a chair using your arms (e.g., wheelchair or bedside chair)?: A Little Help needed to walk in hospital room?: A Lot Help needed climbing 3-5 steps with a railing? : A Lot 6 Click Score: 15    End of  Session Equipment Utilized During Treatment: Gait belt Activity Tolerance: Patient tolerated treatment well;Patient limited by fatigue Patient left: in chair;with call bell/phone within reach;with chair alarm set Nurse Communication: Mobility status PT Visit Diagnosis: Unsteadiness on feet (R26.81);Other abnormalities of gait and mobility (R26.89);Muscle weakness (generalized) (M62.81)     Time: 9074-9050 PT Time Calculation (min) (ACUTE ONLY): 24 min  Charges:    $Therapeutic Activity: 23-37 mins PT General Charges $$ ACUTE PT VISIT: 1 Visit                     12:40 PM, 05/30/2024 Chananya Canizalez Powell-Butler, PT, DPT [place] with [facility]

## 2024-05-30 NOTE — Progress Notes (Signed)
 Mobility Specialist Progress Note:    05/30/24 1130  Mobility  Activity Pivoted/transferred from chair to bed  Level of Assistance Minimal assist, patient does 75% or more  Assistive Device Front wheel walker  Distance Ambulated (ft) 3 ft  Range of Motion/Exercises Active;All extremities  Activity Response Tolerated well  Mobility Referral Yes  Mobility visit 1 Mobility  Mobility Specialist Start Time (ACUTE ONLY) 1130  Mobility Specialist Stop Time (ACUTE ONLY) 1145  Mobility Specialist Time Calculation (min) (ACUTE ONLY) 15 min   Pt received in bed, requesting assistance to bed. Required MinA to stand and transfer with RW. Tolerated well, asx throughout. Alarm on, all needs met.  Omarie Parcell Mobility Specialist Please contact via Special Educational Needs Teacher or  Rehab office at [phone]

## 2024-05-30 NOTE — Plan of Care (Signed)
  Problem: Coping: Goal: Ability to identify and develop effective coping behavior will improve Outcome: Progressing   Problem: Clinical Measurements: Goal: Quality of life will improve Outcome: Progressing

## 2024-05-30 NOTE — TOC Progression Note (Signed)
 Transition of Care [facility]) - Progression Note    Patient Details  Name: Hector Lowe MRN: [id number] Date of Birth: [birth year]  Transition of [facility] CM/SW Contact  [name], [place] Phone Number: 05/30/2024, 10:34 AM  Clinical Narrative:     CSW reached out to Ozell today at [facility] to check in and see if he had a bed today . Ozell declined stating that he did not have one today , but should have one the beginning of next week. CSW added avoidable days . ICM will continue to follow.   Expected Discharge Plan: Skilled Nursing Facility Barriers to Discharge: No SNF bed         Expected Discharge Plan and Services In-house Referral: Clinical Social Work Discharge Planning Services: CM Consult Post Acute Care Choice: Durable Medical Equipment Living arrangements for the past 2 months: Single Family Home Expected Discharge Date: 05/19/24               DME Arranged: Oxygen DME Agency: [employer] Date DME Agency Contacted: 05/13/24 Time DME Agency Contacted: 1525 Representative spoke with at DME Agency: Darlyn             Social Drivers of Health (SDOH) Interventions SDOH Screenings   Food Insecurity: Patient Unable To Answer (05/11/2024)  Housing: Unknown (05/11/2024)  Transportation Needs: Patient Unable To Answer (05/11/2024)  Utilities: Patient Unable To Answer (05/11/2024)  Social Connections: Patient Unable To Answer (05/11/2024)  Tobacco Use: High Risk (05/11/2024)    Readmission Risk Interventions    05/30/2024   10:34 AM 05/27/2024    2:33 PM 05/14/2024   12:50 PM  Readmission Risk Prevention Plan  Transportation Screening Complete Complete Complete  Home Care Screening Complete Complete Complete  Medication Review (RN CM) Complete Complete Complete

## 2024-05-31 DIAGNOSIS — E870 Hyperosmolality and hypernatremia: Secondary | ICD-10-CM | POA: Diagnosis not present

## 2024-05-31 LAB — GLUCOSE, CAPILLARY
Glucose-Capillary: 53 mg/dL — ABNORMAL LOW (ref 70–99)
Glucose-Capillary: 90 mg/dL (ref 70–99)

## 2024-05-31 NOTE — Plan of Care (Signed)
  Problem: Education: Goal: Knowledge of the prescribed therapeutic regimen will improve Outcome: Progressing   Problem: Coping: Goal: Ability to identify and develop effective coping behavior will improve Outcome: Progressing   Problem: Respiratory: Goal: Verbalizations of increased ease of respirations will increase Outcome: Progressing   Problem: Pain Management: Goal: Satisfaction with pain management regimen will improve Outcome: Progressing

## 2024-05-31 NOTE — Plan of Care (Signed)

## 2024-05-31 NOTE — Progress Notes (Signed)
 " PROGRESS NOTE  Hector Lowe  FMW:[id number] DOB: 02-Oct-1921 DOA: 05/11/2024 PCP: [name], MD  Consultants  Brief Narrative:  89 y.o. male with medical history significant for CKD 3, hypertension, gout. Patient was brought to the ED via EMS with reports of 1 week of progressive generalized weakness, sore throat, cough, nausea and vomiting and lower abdominal pain.  Over the past week he has barely had any oral intake, he has had vomiting with every oral intake, and has not been able to stay hydrated.  Patient was admitted with acute hypoxemic respiratory failure secondary to influenza A infection with likely superimposed pneumonia.  He also has new onset atrial fibrillation with RVR.  He currently continues to have significant amounts of weakness and poor oral intake with hypernatremia.  Transitioned to full comfort care on 05/18/24.  Residential hospice/[facility] unable to take patient due to positive flu test   Medical director at [facility] denied residential hospice, will reassess on Monday. Family can't take him home.   Needs SNF  Assessment & Plan:  Principal Problem:   Hypernatremia Active Problems:   Influenza A   Acute hypoxic respiratory failure (HCC)   Atrial fibrillation with RVR (HCC)   -influenza A infection with presumed superimposed pneumonia - Treatment with Tamiflu  completed -Completed 5 days of appropriate antibiotics for pneumonia --At family request transitioned patient to full comfort care on 05/18/2024 -- continues to be unable to eat enough to sustain himself -Patient without fevers or any other influenza symptoms for several days now -Repeat testing done on 1/2 is still positive for Flu A   Acute hypoxic respiratory failure - Secondary to above -Hypoxia has resolved patient has been weaned off oxygen completely - Comfort care    New onset atrial fibrillation with RVR:  - 2D echo demonstrating preserved ejection fraction and no significant  valvular disorder. -- Comfort care   Chronic kidney disease stage IIIb - Appears to be close to his baseline,  -- On comfort care   Hypertension - on comfort care, not following vitals   Severe protein calorie malnutrition -Body mass index is 14.68 kg/m.  -On comfort care now  Hypernatremia - Resolved   Generalized weakness/physical deconditioning/anorexia -- Comfort care now   Disposition: Full comfort care.  Hospice residential facility didn't approve.Needs SNF, awaiting bed availability.   DVT prophylaxis: Not indicated     Code Status: Do not attempt resuscitation (DNR) - Comfort care Family Communication:  None at the bedside Status is: Inpatient Remains inpatient appropriate because: on comfort care  Subjective: Sleepier today than he has been.  No acute events overnight. Denied any active complaints, nasal oxygen cannula is in place  Objective: Vitals:   05/29/24 2028 05/30/24 1335 05/31/24 0437 05/31/24 1018  BP: 109/71 118/85 103/69 105/86  Pulse: (!) 134 76 (!) 112 (!) 111  Resp: 18 16 11 16   Temp: (!) 97.5 F (36.4 C) (!) 97.4 F (36.3 C) 97.9 F (36.6 C) (!) 97.4 F (36.3 C)  TempSrc: Oral Oral Oral   SpO2: 100% 94% 94%   Weight:      Height:        Intake/Output Summary (Last 24 hours) at 05/31/2024 1214 Last data filed at 05/31/2024 9072 Gross per 24 hour  Intake 190 ml  Output --  Net 190 ml   Filed Weights   05/11/24 1710  Weight: 46.4 kg   Body mass index is 14.68 kg/m.  Gen: 89 y.o. male in no apparent distress.  Nontoxic, elderly male, frail-appearing.   Pulm: Non-labored breathing.  Clear to auscultation bilaterally.  CV: Regular rate and rhythm. No murmur, rub, or gallop. No JVD GI: Abdomen soft, non-tender, non-distended, with normoactive bowel sounds. No organomegaly or masses felt. Ext: Warm, no deformities,  Neuro: Alert and oriented. No focal neurological deficits. Psych: Calm  Judgement and insight appear normal. Mood &  affect appropriate.     I have personally reviewed the following labs and images: CBC: No results for input(s): WBC, NEUTROABS, HGB, HCT, MCV, PLT in the last 168 hours.  BMP &GFR No results for input(s): NA, K, CL, CO2, GLUCOSE, BUN, CREATININE, CALCIUM , MG, PHOS in the last 168 hours.  Invalid input(s): GFRCG  Estimated Creatinine Clearance: 17.4 mL/min (A) (by C-G formula based on SCr of 1.48 mg/dL (H)). Liver & Pancreas: No results for input(s): AST, ALT, ALKPHOS, BILITOT, PROT, ALBUMIN in the last 168 hours. No results for input(s): LIPASE, AMYLASE in the last 168 hours. No results for input(s): AMMONIA in the last 168 hours. Diabetic: No results for input(s): HGBA1C in the last 72 hours. No results for input(s): GLUCAP in the last 168 hours. Cardiac Enzymes: No results for input(s): CKTOTAL, CKMB, CKMBINDEX, TROPONINI in the last 168 hours. No results for input(s): PROBNP in the last 8760 hours. Coagulation Profile: No results for input(s): INR, PROTIME in the last 168 hours. Thyroid  Function Tests: No results for input(s): TSH, T4TOTAL, FREET4, T3FREE, THYROIDAB in the last 72 hours. Lipid Profile: No results for input(s): CHOL, HDL, LDLCALC, TRIG, CHOLHDL, LDLDIRECT in the last 72 hours. Anemia Panel: No results for input(s): VITAMINB12, FOLATE, FERRITIN, TIBC, IRON, RETICCTPCT in the last 72 hours. Urine analysis:    Component Value Date/Time   COLORURINE AMBER (A) 05/11/2024 1410   APPEARANCEUR HAZY (A) 05/11/2024 1410   LABSPEC 1.023 05/11/2024 1410   PHURINE 5.0 05/11/2024 1410   GLUCOSEU NEGATIVE 05/11/2024 1410   HGBUR SMALL (A) 05/11/2024 1410   BILIRUBINUR NEGATIVE 05/11/2024 1410   KETONESUR 5 (A) 05/11/2024 1410   PROTEINUR 100 (A) 05/11/2024 1410   NITRITE NEGATIVE 05/11/2024 1410   LEUKOCYTESUR NEGATIVE 05/11/2024 1410   Sepsis Labs: Invalid  input(s): PROCALCITONIN, LACTICIDVEN  Microbiology: Recent Results (from the past 240 hours)  Resp panel by RT-PCR (RSV, Flu A&B, Covid) Anterior Nasal Swab     Status: Abnormal   Collection Time: 05/23/24 10:36 AM   Specimen: Anterior Nasal Swab  Result Value Ref Range Status   SARS Coronavirus 2 by RT PCR NEGATIVE NEGATIVE Final    Comment: (NOTE) SARS-CoV-2 target nucleic acids are NOT DETECTED.  The SARS-CoV-2 RNA is generally detectable in upper respiratory specimens during the acute phase of infection. The lowest concentration of SARS-CoV-2 viral copies this assay can detect is 138 copies/mL. A negative result does not preclude SARS-Cov-2 infection and should not be used as the sole basis for treatment or other patient management decisions. A negative result may occur with  improper specimen collection/handling, submission of specimen other than nasopharyngeal swab, presence of viral mutation(s) within the areas targeted by this assay, and inadequate number of viral copies(<138 copies/mL). A negative result must be combined with clinical observations, patient history, and epidemiological information. The expected result is Negative.  Fact Sheet for Patients:  bloggercourse.com  Fact Sheet for Healthcare Providers:  seriousbroker.it  This test is no t yet approved or cleared by the [place]  FDA and  has been authorized for detection and/or diagnosis of SARS-CoV-2 by FDA under an Emergency  Use Authorization (EUA). This EUA will remain  in effect (meaning this test can be used) for the duration of the COVID-19 declaration under Section 564(b)(1) of the Act, 21 U.S.C.section 360bbb-3(b)(1), unless the authorization is terminated  or revoked sooner.       Influenza A by PCR POSITIVE (A) NEGATIVE Final   Influenza B by PCR NEGATIVE NEGATIVE Final    Comment: (NOTE) The Xpert Xpress SARS-CoV-2/FLU/RSV plus assay is  intended as an aid in the diagnosis of influenza from Nasopharyngeal swab specimens and should not be used as a sole basis for treatment. Nasal washings and aspirates are unacceptable for Xpert Xpress SARS-CoV-2/FLU/RSV testing.  Fact Sheet for Patients: bloggercourse.com  Fact Sheet for Healthcare Providers: seriousbroker.it  This test is not yet approved or cleared by the [place]  FDA and has been authorized for detection and/or diagnosis of SARS-CoV-2 by FDA under an Emergency Use Authorization (EUA). This EUA will remain in effect (meaning this test can be used) for the duration of the COVID-19 declaration under Section 564(b)(1) of the Act, 21 U.S.C. section 360bbb-3(b)(1), unless the authorization is terminated or revoked.     Resp Syncytial Virus by PCR NEGATIVE NEGATIVE Final    Comment: (NOTE) Fact Sheet for Patients: bloggercourse.com  Fact Sheet for Healthcare Providers: seriousbroker.it  This test is not yet approved or cleared by the [place]  FDA and has been authorized for detection and/or diagnosis of SARS-CoV-2 by FDA under an Emergency Use Authorization (EUA). This EUA will remain in effect (meaning this test can be used) for the duration of the COVID-19 declaration under Section 564(b)(1) of the Act, 21 U.S.C. section 360bbb-3(b)(1), unless the authorization is terminated or revoked.  Performed at [facility], [address] [place], [place] [address]     Radiology Studies: No results found.  Scheduled Meds:  aspirin   81 mg Oral Daily   feeding supplement  237 mL Oral BID BM   metoprolol  tartrate  25 mg Oral BID   nystatin    Topical BID   Continuous Infusions:   LOS: 20 days   35 minutes with more than 50% spent in reviewing records, counseling patient/family and coordinating care.  Reyes VEAR Gaw, MD [facility] [email or web address] 05/31/2024, 12:14 PM    "

## 2024-06-01 DIAGNOSIS — E870 Hyperosmolality and hypernatremia: Secondary | ICD-10-CM | POA: Diagnosis not present

## 2024-06-01 LAB — GLUCOSE, CAPILLARY: Glucose-Capillary: 86 mg/dL (ref 70–99)

## 2024-06-01 MED ORDER — CAMPHOR-MENTHOL 0.5-0.5 % EX LOTN: TOPICAL_LOTION | CUTANEOUS | Status: DC | PRN

## 2024-06-01 MED ORDER — HYDROXYZINE HCL 10 MG PO TABS: 10.0000 mg | ORAL_TABLET | Freq: Once | ORAL | Status: DC | PRN

## 2024-06-01 NOTE — Progress Notes (Signed)
 " PROGRESS NOTE  Hector Lowe  FMW:[id number] DOB: Oct 02, 1921 DOA: 05/11/2024 PCP: [name], MD  Consultants  Brief Narrative:  89 y.o. male with medical history significant for CKD 3, hypertension, gout. Patient was brought to the ED via EMS with reports of 1 week of progressive generalized weakness, sore throat, cough, nausea and vomiting and lower abdominal pain.  Over the past week he has barely had any oral intake, he has had vomiting with every oral intake, and has not been able to stay hydrated.  Patient was admitted with acute hypoxemic respiratory failure secondary to influenza A infection with likely superimposed pneumonia.  He also has new onset atrial fibrillation with RVR.  He currently continues to have significant amounts of weakness and poor oral intake with hypernatremia.  Transitioned to full comfort care on 05/18/24.  Residential hospice/[facility] unable to take patient due to positive flu test   Medical director at [facility] denied residential hospice, will reassess on Monday. Family can't take him home.   Needs SNF  Assessment & Plan: Generalized weakness/physical deconditioning/anorexia -- Comfort care now.  Awaiting SNF placement  -Influenza A infection with presumed superimposed pneumonia - reason for initial admission.  - Treatment with Tamiflu  completed -Completed 5 days of appropriate antibiotics for pneumonia --At family request transitioned patient to full comfort care on 05/18/2024 -- continues to be unable to eat enough to sustain himself -Patient without fevers or any other influenza symptoms for several days now -Repeat testing done on 1/2 is still positive for Flu A   Acute hypoxic respiratory failure - Secondary to above -Hypoxia has resolved patient has been weaned off oxygen completely - Comfort care    New onset atrial fibrillation with RVR: - 2D echo demonstrating preserved ejection fraction and no significant valvular disorder. --  Comfort care   Chronic kidney disease stage IIIb - Appears to be close to his baseline,  -- On comfort care   Hypertension - on comfort care, not following vitals   Severe protein calorie malnutrition -Body mass index is 14.68 kg/m.  -On comfort care now  Hypernatremia - Resolved   Disposition: Full comfort care.  Hospice residential facility didn't approve.Needs SNF, awaiting bed availability.   DVT prophylaxis: Not indicated     Code Status: Do not attempt resuscitation (DNR) - Comfort care Family Communication:  None at the bedside Status is: Inpatient Remains inpatient appropriate because: on comfort care  Subjective: More awake today, ate some of breakfast.  Had a nice conversation with me.  No complaints.   No acute events overnight. Nasal oxygen cannula is in place  Objective: Vitals:   05/31/24 1256 05/31/24 2206 06/01/24 0435 06/01/24 1044  BP: 111/73 111/79 105/68 96/68  Pulse: (!) 11 (!) 115 (!) 113 (!) 111  Resp: 18  12   Temp: 97.8 F (36.6 C)  97.9 F (36.6 C)   TempSrc: Axillary  Oral   SpO2: 99%  97%   Weight:      Height:       No intake or output data in the 24 hours ending 06/01/24 1148  Filed Weights   05/11/24 1710  Weight: 46.4 kg   Body mass index is 14.68 kg/m.  Gen: 89 y.o. male in no apparent distress.  Nontoxic, elderly male, frail-appearing. Awake and talking with me today.  Pulm: Non-labored breathing.  Some scattered rhonchi CV: Regular rate and rhythm.  GI: Abdomen soft, non-tender, non-distended Ext: Warm, no deformities,  Neuro: Alert and oriented.  No focal neurological deficits. Psych: Calm  Judgement and insight appear normal. Mood & affect appropriate.  Pleasant and conversant today.    I have personally reviewed the following labs and images: CBC: No results for input(s): WBC, NEUTROABS, HGB, HCT, MCV, PLT in the last 168 hours.  BMP &GFR No results for input(s): NA, K, CL, CO2, GLUCOSE,  BUN, CREATININE, CALCIUM , MG, PHOS in the last 168 hours.  Invalid input(s): GFRCG  Estimated Creatinine Clearance: 17.4 mL/min (A) (by C-G formula based on SCr of 1.48 mg/dL (H)). Liver & Pancreas: No results for input(s): AST, ALT, ALKPHOS, BILITOT, PROT, ALBUMIN in the last 168 hours. No results for input(s): LIPASE, AMYLASE in the last 168 hours. No results for input(s): AMMONIA in the last 168 hours. Diabetic: No results for input(s): HGBA1C in the last 72 hours. Recent Labs  Lab 05/31/24 2034 05/31/24 2259  GLUCAP 53* 90   Cardiac Enzymes: No results for input(s): CKTOTAL, CKMB, CKMBINDEX, TROPONINI in the last 168 hours. No results for input(s): PROBNP in the last 8760 hours. Coagulation Profile: No results for input(s): INR, PROTIME in the last 168 hours. Thyroid  Function Tests: No results for input(s): TSH, T4TOTAL, FREET4, T3FREE, THYROIDAB in the last 72 hours. Lipid Profile: No results for input(s): CHOL, HDL, LDLCALC, TRIG, CHOLHDL, LDLDIRECT in the last 72 hours. Anemia Panel: No results for input(s): VITAMINB12, FOLATE, FERRITIN, TIBC, IRON, RETICCTPCT in the last 72 hours. Urine analysis:    Component Value Date/Time   COLORURINE AMBER (A) 05/11/2024 1410   APPEARANCEUR HAZY (A) 05/11/2024 1410   LABSPEC 1.023 05/11/2024 1410   PHURINE 5.0 05/11/2024 1410   GLUCOSEU NEGATIVE 05/11/2024 1410   HGBUR SMALL (A) 05/11/2024 1410   BILIRUBINUR NEGATIVE 05/11/2024 1410   KETONESUR 5 (A) 05/11/2024 1410   PROTEINUR 100 (A) 05/11/2024 1410   NITRITE NEGATIVE 05/11/2024 1410   LEUKOCYTESUR NEGATIVE 05/11/2024 1410   Sepsis Labs: Invalid input(s): PROCALCITONIN, LACTICIDVEN  Microbiology: Recent Results (from the past 240 hours)  Resp panel by RT-PCR (RSV, Flu A&B, Covid) Anterior Nasal Swab     Status: Abnormal   Collection Time: 05/23/24 10:36 AM   Specimen: Anterior Nasal Swab   Result Value Ref Range Status   SARS Coronavirus 2 by RT PCR NEGATIVE NEGATIVE Final    Comment: (NOTE) SARS-CoV-2 target nucleic acids are NOT DETECTED.  The SARS-CoV-2 RNA is generally detectable in upper respiratory specimens during the acute phase of infection. The lowest concentration of SARS-CoV-2 viral copies this assay can detect is 138 copies/mL. A negative result does not preclude SARS-Cov-2 infection and should not be used as the sole basis for treatment or other patient management decisions. A negative result may occur with  improper specimen collection/handling, submission of specimen other than nasopharyngeal swab, presence of viral mutation(s) within the areas targeted by this assay, and inadequate number of viral copies(<138 copies/mL). A negative result must be combined with clinical observations, patient history, and epidemiological information. The expected result is Negative.  Fact Sheet for Patients:  bloggercourse.com  Fact Sheet for Healthcare Providers:  seriousbroker.it  This test is no t yet approved or cleared by the [place]  FDA and  has been authorized for detection and/or diagnosis of SARS-CoV-2 by FDA under an Emergency Use Authorization (EUA). This EUA will remain  in effect (meaning this test can be used) for the duration of the COVID-19 declaration under Section 564(b)(1) of the Act, 21 U.S.C.section 360bbb-3(b)(1), unless the authorization is terminated  or revoked sooner.  Influenza A by PCR POSITIVE (A) NEGATIVE Final   Influenza B by PCR NEGATIVE NEGATIVE Final    Comment: (NOTE) The Xpert Xpress SARS-CoV-2/FLU/RSV plus assay is intended as an aid in the diagnosis of influenza from Nasopharyngeal swab specimens and should not be used as a sole basis for treatment. Nasal washings and aspirates are unacceptable for Xpert Xpress SARS-CoV-2/FLU/RSV testing.  Fact Sheet for  Patients: bloggercourse.com  Fact Sheet for Healthcare Providers: seriousbroker.it  This test is not yet approved or cleared by the [place]  FDA and has been authorized for detection and/or diagnosis of SARS-CoV-2 by FDA under an Emergency Use Authorization (EUA). This EUA will remain in effect (meaning this test can be used) for the duration of the COVID-19 declaration under Section 564(b)(1) of the Act, 21 U.S.C. section 360bbb-3(b)(1), unless the authorization is terminated or revoked.     Resp Syncytial Virus by PCR NEGATIVE NEGATIVE Final    Comment: (NOTE) Fact Sheet for Patients: bloggercourse.com  Fact Sheet for Healthcare Providers: seriousbroker.it  This test is not yet approved or cleared by the [place]  FDA and has been authorized for detection and/or diagnosis of SARS-CoV-2 by FDA under an Emergency Use Authorization (EUA). This EUA will remain in effect (meaning this test can be used) for the duration of the COVID-19 declaration under Section 564(b)(1) of the Act, 21 U.S.C. section 360bbb-3(b)(1), unless the authorization is terminated or revoked.  Performed at [facility], [address] [place], [place] [address]     Radiology Studies: No results found.  Scheduled Meds:  aspirin   81 mg Oral Daily   feeding supplement  237 mL Oral BID BM   metoprolol  tartrate  25 mg Oral BID   nystatin    Topical BID   Continuous Infusions:   LOS: 21 days   35 minutes with more than 50% spent in reviewing records, counseling patient/family and coordinating care.  Reyes VEAR Gaw, MD [facility] [email or web address] 06/01/2024, 11:48 AM    "

## 2024-06-01 NOTE — Plan of Care (Signed)

## 2024-06-02 DIAGNOSIS — F0390 Unspecified dementia without behavioral disturbance: Secondary | ICD-10-CM

## 2024-06-02 DIAGNOSIS — J101 Influenza due to other identified influenza virus with other respiratory manifestations: Secondary | ICD-10-CM

## 2024-06-02 DIAGNOSIS — Z515 Encounter for palliative care: Secondary | ICD-10-CM

## 2024-06-02 NOTE — Hospital Course (Addendum)
 Brief Narrative:   89 y.o. male with medical history significant for CKD 3, hypertension, gout. Patient was brought to the ED via EMS with reports of 1 week of progressive generalized weakness, sore throat, cough, nausea and vomiting and lower abdominal pain.  Over the past week he has barely had any oral intake, he has had vomiting with every oral intake, and has not been able to stay hydrated.  Patient was admitted with acute hypoxemic respiratory failure secondary to influenza A infection with likely superimposed pneumonia.  He also has new onset atrial fibrillation with RVR.  He currently continues to have significant amounts of weakness and poor oral intake with hypernatremia.   Transitioned to full comfort care on 05/18/24.  Residential hospice/[facility] unable to take patient due to positive flu test    Medical director at [facility] denied residential hospice, will reassess on Monday. Family can't take him home.    Patient is to be discharged.  Will be GIP admit  Assessment & Plan:  Comfort care/hospice Generalized weakness and physical deconditioning Influenza A infection Acute hypoxic respiratory failure, improved New onset atrial fibrillation with RVR CKD stage IIIb Essential hypertension Adult failure to thrive with severe protein calorie malnutrition Hypernatremia   -Patient is currently fully comfort care.  Was not approved for residential hospice.  TOC working on safe disposition    DVT prophylaxis: None    Code Status: Do not attempt resuscitation (DNR) - Comfort care Family Communication:   DC  Subjective:  No new events overnight  Examination:  General exam: Awake and nonverbal.  No meaningful interaction.  Cachectic frail diffuse muscle wasting and temporal wasting Respiratory system: Clear to auscultation. Respiratory effort normal. Cardiovascular system: S1 & S2 heard, RRR. No JVD, murmurs, rubs, gallops or clicks. No pedal edema. Gastrointestinal system:  Abdomen is nondistended, soft and nontender. No organomegaly or masses felt. Normal bowel sounds heard. Central nervous system: Unable to assess Extremities: Symmetric 5 x 5 power. Skin: No rashes, lesions or ulcers Psychiatry: Judgement and insight appear poor

## 2024-06-02 NOTE — Plan of Care (Signed)

## 2024-06-02 NOTE — Progress Notes (Signed)
 Palliative: Mr. Nerissa is sitting up quietly in bed.  He appears acutely/chronically ill and quite frail.  He has known dementia, and does not interact with me in any meaningful way.  He clearly cannot make his basic needs known.  There is no family at bedside at this time.   I offer Mr. Augello for something to eat and drink.  He is able to take bites from Magic cup, but seems to have delayed swallowing although there are no overt signs and symptoms of aspiration.  When offered water with a straw he is able to drink from the straw.   Face-to-face conference with attending and transition of care team related to patient condition, needs, goals of care, disposition. Comfort care order set in place.  No symptom management needs at this time.  Plan: Continue comfort measures only.  Transition of care team is reaching out to hospice providers for placement.  25 minutes Lloyde Ludlam, NP Palliative medicine team Team phone [phone]

## 2024-06-02 NOTE — Plan of Care (Signed)
  Problem: Clinical Measurements: Goal: Quality of life will improve Outcome: Progressing   Problem: Role Relationship: Goal: Family's ability to cope with current situation will improve Outcome: Progressing   Problem: Pain Management: Goal: Satisfaction with pain management regimen will improve Outcome: Progressing   

## 2024-06-02 NOTE — TOC Progression Note (Addendum)
 Transition of Care [facility]) - Progression Note    Patient Details  Name: Hector Lowe MRN: [id number] Date of Birth: June 01, 1921  [facility]) CM/SW Contact  [name], [place] Phone Number: 06/02/2024, 1:33 PM  Clinical Narrative:     Lucie with [facility] called CSW around 12:24 PM regarding there nurse coming out to re-assess patient after speaking with patient son . Lucie did share that they did not have a open bed at [facility], but could do a virtual bed in hospital. Lucie stated that she will update CSW after assessment.   Addendum 3:59 pm   Lucie called CSW back stating that they have accepted patient under Hospice care and that they will do a virtual bed since [facility] does not have a bed. CSW updated Palliative NP, MD, and Nurse regarding change and need for his chart to be flipped to GIP. Lucie did share that family was made aware.    Expected Discharge Plan: Skilled Nursing Facility Barriers to Discharge: No SNF bed, Other (must enter comment) ([facility] is coming to re- assess - they still do not have any open beds at [facility])     Expected Discharge Plan and Services In-house Referral: Clinical Social Work Discharge Planning Services: CM Consult Post Acute Care Choice: Durable Medical Equipment Living arrangements for the past 2 months: Single Family Home Expected Discharge Date: 05/19/24               DME Arranged: Oxygen DME Agency: [employer] Date DME Agency Contacted: 05/13/24 Time DME Agency Contacted: 1525 Representative spoke with at DME Agency: Darlyn             Social Drivers of Health (SDOH) Interventions SDOH Screenings   Food Insecurity: Patient Unable To Answer (05/11/2024)  Housing: Unknown (05/11/2024)  Transportation Needs: Patient Unable To Answer (05/11/2024)  Utilities: Patient Unable To Answer (05/11/2024)  Social Connections: Patient Unable To Answer (05/11/2024)  Tobacco Use: High Risk (05/11/2024)     Readmission Risk Interventions    06/02/2024    1:32 PM 05/30/2024   10:34 AM 05/27/2024    2:33 PM  Readmission Risk Prevention Plan  Transportation Screening Complete Complete Complete  Home Care Screening Complete Complete Complete  Medication Review (RN CM) Complete Complete Complete

## 2024-06-02 NOTE — Progress Notes (Signed)
 " [name]  FMW:[id number] DOB: 1921-07-27 DOA: 05/11/2024 PCP: [name], MD    Brief Narrative:   89 y.o. male with medical history significant for CKD 3, hypertension, gout. Patient was brought to the ED via EMS with reports of 1 week of progressive generalized weakness, sore throat, cough, nausea and vomiting and lower abdominal pain.  Over the past week he has barely had any oral intake, he has had vomiting with every oral intake, and has not been able to stay hydrated.  Patient was admitted with acute hypoxemic respiratory failure secondary to influenza A infection with likely superimposed pneumonia.  He also has new onset atrial fibrillation with RVR.  He currently continues to have significant amounts of weakness and poor oral intake with hypernatremia.   Transitioned to full comfort care on 05/18/24.  Residential hospice/[facility] unable to take patient due to positive flu test    Medical director at [facility] denied residential hospice, will reassess on Monday. Family can't take him home.    Needs SNF  Assessment & Plan:  Comfort care/hospice Generalized weakness and physical deconditioning Influenza A infection Acute hypoxic respiratory failure, improved New onset atrial fibrillation with RVR CKD stage IIIb Essential hypertension Adult failure to thrive with severe protein calorie malnutrition Hypernatremia   -Patient is currently fully comfort care.  Was not approved for residential hospice.  TOC working on safe disposition    DVT prophylaxis: None    Code Status: Do not attempt resuscitation (DNR) - Comfort care Family Communication:   On ongoing safe disposition   PT Follow up Recs: Skilled Nursing-Short Term Rehab (<3 Hours/Day)05/30/2024 1233  Subjective:  Laying in the bed, no meaningful interaction.  He is awake but nonverbal  Examination:  General exam: Awake and nonverbal.  No meaningful interaction.  Cachectic frail  diffuse muscle wasting and temporal wasting Respiratory system: Clear to auscultation. Respiratory effort normal. Cardiovascular system: S1 & S2 heard, RRR. No JVD, murmurs, rubs, gallops or clicks. No pedal edema. Gastrointestinal system: Abdomen is nondistended, soft and nontender. No organomegaly or masses felt. Normal bowel sounds heard. Central nervous system: Unable to assess Extremities: Symmetric 5 x 5 power. Skin: No rashes, lesions or ulcers Psychiatry: Judgement and insight appear poor                Diet Orders (From admission, onward)     Start     Ordered   05/13/24 1449  DIET - DYS 1 Room service appropriate? Yes; Fluid consistency: Thin  Diet effective now       Question Answer Comment  Room service appropriate? Yes   Fluid consistency: Thin      05/13/24 1449            Objective: Vitals:   05/31/24 2206 06/01/24 0435 06/01/24 1044 06/01/24 2052  BP: 111/79 105/68 96/68 114/78  Pulse: (!) 115 (!) 113 (!) 111 72  Resp:  12  18  Temp:  97.9 F (36.6 C)  98.4 F (36.9 C)  TempSrc:  Oral  Oral  SpO2:  97%  97%  Weight:      Height:        Intake/Output Summary (Last 24 hours) at 06/02/2024 1110 Last data filed at 06/01/2024 1846 Gross per 24 hour  Intake 120 ml  Output --  Net 120 ml   Filed Weights   05/11/24 1710  Weight: 46.4 kg    Scheduled Meds:  aspirin   81 mg  Oral Daily   feeding supplement  237 mL Oral BID BM   metoprolol  tartrate  25 mg Oral BID   nystatin    Topical BID   Continuous Infusions:  Nutritional status     Body mass index is 14.68 kg/m.  Data Reviewed:   CBC: No results for input(s): WBC, NEUTROABS, HGB, HCT, MCV, PLT in the last 168 hours. Basic Metabolic Panel: No results for input(s): NA, K, CL, CO2, GLUCOSE, BUN, CREATININE, CALCIUM , MG, PHOS in the last 168 hours. GFR: Estimated Creatinine Clearance: 17.4 mL/min (A) (by C-G formula based on SCr of 1.48 mg/dL  (H)). Liver Function Tests: No results for input(s): AST, ALT, ALKPHOS, BILITOT, PROT, ALBUMIN in the last 168 hours. No results for input(s): LIPASE, AMYLASE in the last 168 hours. No results for input(s): AMMONIA in the last 168 hours. Coagulation Profile: No results for input(s): INR, PROTIME in the last 168 hours. Cardiac Enzymes: No results for input(s): CKTOTAL, CKMB, CKMBINDEX, TROPONINI in the last 168 hours. BNP (last 3 results) No results for input(s): PROBNP in the last 8760 hours. HbA1C: No results for input(s): HGBA1C in the last 72 hours. CBG: Recent Labs  Lab 05/31/24 2034 05/31/24 2259 06/01/24 2010  GLUCAP 53* 90 86   Lipid Profile: No results for input(s): CHOL, HDL, LDLCALC, TRIG, CHOLHDL, LDLDIRECT in the last 72 hours. Thyroid  Function Tests: No results for input(s): TSH, T4TOTAL, FREET4, T3FREE, THYROIDAB in the last 72 hours. Anemia Panel: No results for input(s): VITAMINB12, FOLATE, FERRITIN, TIBC, IRON, RETICCTPCT in the last 72 hours. Sepsis Labs: No results for input(s): PROCALCITON, LATICACIDVEN in the last 168 hours.  No results found for this or any previous visit (from the past 240 hours).       Radiology Studies: No results found.         LOS: 22 days   Time spent= 35 mins    Burgess JAYSON Dare, MD [facility]  If 7PM-7AM, please contact night-coverage  06/02/2024, 11:10 AM  "

## 2024-06-03 ENCOUNTER — Inpatient Hospital Stay (HOSPITAL_COMMUNITY)
Admission: AD | Admit: 2024-06-03 | Discharge: 2024-06-04 | DRG: 951 | Disposition: A | Discharge status: Hospice | Source: Hospice | Attending: Internal Medicine | Admitting: Internal Medicine

## 2024-06-03 DIAGNOSIS — R627 Adult failure to thrive: Secondary | ICD-10-CM | POA: Diagnosis present

## 2024-06-03 DIAGNOSIS — E870 Hyperosmolality and hypernatremia: Secondary | ICD-10-CM | POA: Diagnosis present

## 2024-06-03 DIAGNOSIS — I4891 Unspecified atrial fibrillation: Secondary | ICD-10-CM | POA: Diagnosis present

## 2024-06-03 DIAGNOSIS — Z79899 Other long term (current) drug therapy: Secondary | ICD-10-CM

## 2024-06-03 DIAGNOSIS — R64 Cachexia: Secondary | ICD-10-CM | POA: Diagnosis present

## 2024-06-03 DIAGNOSIS — E43 Unspecified severe protein-calorie malnutrition: Secondary | ICD-10-CM | POA: Diagnosis present

## 2024-06-03 DIAGNOSIS — Z515 Encounter for palliative care: Principal | ICD-10-CM

## 2024-06-03 DIAGNOSIS — E860 Dehydration: Secondary | ICD-10-CM | POA: Diagnosis present

## 2024-06-03 DIAGNOSIS — J101 Influenza due to other identified influenza virus with other respiratory manifestations: Secondary | ICD-10-CM | POA: Diagnosis present

## 2024-06-03 DIAGNOSIS — Z681 Body mass index (BMI) 19 or less, adult: Secondary | ICD-10-CM

## 2024-06-03 DIAGNOSIS — N1832 Chronic kidney disease, stage 3b: Secondary | ICD-10-CM | POA: Diagnosis present

## 2024-06-03 DIAGNOSIS — E785 Hyperlipidemia, unspecified: Secondary | ICD-10-CM | POA: Diagnosis present

## 2024-06-03 DIAGNOSIS — Z66 Do not resuscitate: Secondary | ICD-10-CM | POA: Diagnosis present

## 2024-06-03 DIAGNOSIS — M109 Gout, unspecified: Secondary | ICD-10-CM | POA: Diagnosis present

## 2024-06-03 DIAGNOSIS — I252 Old myocardial infarction: Secondary | ICD-10-CM

## 2024-06-03 DIAGNOSIS — I2510 Atherosclerotic heart disease of native coronary artery without angina pectoris: Secondary | ICD-10-CM | POA: Diagnosis present

## 2024-06-03 DIAGNOSIS — J9601 Acute respiratory failure with hypoxia: Secondary | ICD-10-CM | POA: Diagnosis present

## 2024-06-03 DIAGNOSIS — I129 Hypertensive chronic kidney disease with stage 1 through stage 4 chronic kidney disease, or unspecified chronic kidney disease: Secondary | ICD-10-CM | POA: Diagnosis present

## 2024-06-03 MED ORDER — GLYCOPYRROLATE 0.2 MG/ML IJ SOLN: 0.2000 mg | INTRAMUSCULAR | Status: DC | PRN

## 2024-06-03 MED ORDER — GLYCOPYRROLATE 1 MG PO TABS: 1.0000 mg | ORAL_TABLET | ORAL | Status: DC | PRN

## 2024-06-03 MED ORDER — BIOTENE DRY MOUTH MT LIQD: 15.0000 mL | OROMUCOSAL | Status: DC | PRN

## 2024-06-03 MED ORDER — ONDANSETRON 4 MG PO TBDP: 4.0000 mg | ORAL_TABLET | Freq: Four times a day (QID) | ORAL | Status: DC | PRN

## 2024-06-03 MED ORDER — LORAZEPAM 1 MG PO TABS
1.0000 mg | ORAL_TABLET | Freq: Four times a day (QID) | ORAL | Status: DC | PRN
  Administered 2024-06-03 – 2024-06-04 (×2): 1 mg via ORAL
  Filled 2024-06-03 (×2): qty 1

## 2024-06-03 MED ORDER — ACETAMINOPHEN 325 MG PO TABS: 650.0000 mg | ORAL_TABLET | Freq: Four times a day (QID) | ORAL | Status: DC | PRN

## 2024-06-03 MED ORDER — HALOPERIDOL 0.5 MG PO TABS: 0.5000 mg | ORAL_TABLET | ORAL | Status: DC | PRN

## 2024-06-03 MED ORDER — ACETAMINOPHEN 650 MG RE SUPP: 650.0000 mg | Freq: Four times a day (QID) | RECTAL | Status: DC | PRN

## 2024-06-03 MED ORDER — HALOPERIDOL LACTATE 2 MG/ML PO CONC: 0.5000 mg | ORAL | Status: DC | PRN

## 2024-06-03 MED ORDER — MORPHINE SULFATE (CONCENTRATE) 10 MG /0.5 ML PO SOLN
10.0000 mg | ORAL | Status: DC | PRN
  Administered 2024-06-04: 10 mg via ORAL
  Filled 2024-06-03: qty 0.5

## 2024-06-03 MED ORDER — ONDANSETRON HCL 4 MG/2ML IJ SOLN: 4.0000 mg | Freq: Four times a day (QID) | INTRAMUSCULAR | Status: DC | PRN

## 2024-06-03 MED ORDER — HALOPERIDOL LACTATE 5 MG/ML IJ SOLN
0.5000 mg | INTRAMUSCULAR | Status: DC | PRN
  Filled 2024-06-03: qty 0.1

## 2024-06-03 MED ORDER — POLYVINYL ALCOHOL 1.4 % OP SOLN: 1.0000 [drp] | Freq: Four times a day (QID) | OPHTHALMIC | Status: DC | PRN

## 2024-06-03 NOTE — TOC Initial Note (Signed)
 Transition of Care [facility]) - Initial/Assessment Note    Patient Details  Name: Hector Lowe MRN: [id number] Date of Birth: 06/09/1921  Transition of Care [facility]) CM/SW Contact:    Hoy DELENA Bigness, LCSW Phone Number: 06/03/2024, 11:18 AM  Clinical Narrative:                 Chart converted to GIP. ICM following for bed availability at [facility].         Patient Goals and CMS Choice            Expected Discharge Plan and Services                                              Prior Living Arrangements/Services                       Activities of Daily Living      Permission Sought/Granted                  Emotional Assessment              Admission diagnosis:  E86.0 Patient Active Problem List   Diagnosis Date Noted   Hypernatremia 05/11/2024   Influenza A 05/11/2024   Acute hypoxic respiratory failure (HCC) 05/11/2024   Atrial fibrillation with RVR (HCC) 05/11/2024   DNR (do not resuscitate) discussion 06/30/2021   Hyperlipidemia LDL goal <70 06/12/2021   CAD (coronary artery disease) 06/12/2021   Mild AKI 06/12/2021   NSTEMI (non-ST elevated myocardial infarction) (HCC) 06/10/2021   Lower abdominal pain 09/01/2020   Gout flare 03/09/2020   Constipation 03/08/2020   Hyponatremia 03/08/2020   Essential hypertension 03/08/2020   CKD (chronic kidney disease) stage 3, GFR 30-59 ml/min (HCC) 03/08/2020   GERD (gastroesophageal reflux disease) 03/08/2020   Nicotine dependence, chewing tobacco, w oth disorders 03/08/2020   Ileus (HCC) 03/07/2020   PCP:  [name], MD Pharmacy:   [facility] [address], [place] - [address] [place] [address] Phone: [phone] Fax: [phone]     Social Drivers of Health (SDOH) Social History: SDOH Screenings   Food Insecurity: Patient Unable To Answer (05/11/2024)  Housing: Unknown (05/11/2024)  Transportation Needs: Patient Unable To Answer  (05/11/2024)  Utilities: Patient Unable To Answer (05/11/2024)  Social Connections: Patient Unable To Answer (05/11/2024)  Tobacco Use: High Risk (05/11/2024)   SDOH Interventions:     Readmission Risk Interventions    06/02/2024    1:32 PM 05/30/2024   10:34 AM 05/27/2024    2:33 PM  Readmission Risk Prevention Plan  Transportation Screening Complete Complete Complete  Home Care Screening Complete Complete Complete  Medication Review (RN CM) Complete Complete Complete

## 2024-06-03 NOTE — H&P (Signed)
 " History and Physical    Hector Lowe FMW:[id number] DOB: 09-Oct-1921 DOA: 06/03/2024  PCP: [name], MD Patient coming from: Hospital  Chief Complaint: Comfort care  HPI: 89 y.o. male with medical history significant for CKD 3, hypertension, gout. Patient was brought to the ED via EMS with reports of 1 week of progressive generalized weakness, sore throat, cough, nausea and vomiting and lower abdominal pain.  Over the past week he has barely had any oral intake, he has had vomiting with every oral intake, and has not been able to stay hydrated.  Patient was admitted with acute hypoxemic respiratory failure secondary to influenza A infection with likely superimposed pneumonia.  He also has new onset atrial fibrillation with RVR.  He currently continues to have significant amounts of weakness and poor oral intake with hypernatremia.   Transitioned to full comfort care on 05/18/24.  Residential hospice/[facility] unable to take patient due to positive flu test    Medical director at [facility] denied residential hospice, will reassess on Monday. Family can't take him home.    Patient is to be discharged.  Will be GIP admit   Review of Systems: As per HPI otherwise 10 point review of systems negative.  Review of Systems Otherwise negative except as per HPI, including: General: Denies fever, chills, night sweats or unintended weight loss. Resp: Denies cough, wheezing, shortness of breath. Cardiac: Denies chest pain, palpitations, orthopnea, paroxysmal nocturnal dyspnea. GI: Denies abdominal pain, nausea, vomiting, diarrhea or constipation GU: Denies dysuria, frequency, hesitancy or incontinence MS: Denies muscle aches, joint pain or swelling Neuro: Denies headache, neurologic deficits (focal weakness, numbness, tingling), abnormal gait Psych: Denies anxiety, depression, SI/HI/AVH Skin: Denies new rashes or lesions ID: Denies sick contacts, exotic exposures, travel  Past Medical  History:  Diagnosis Date   Acid reflux    CAD (coronary artery disease) 06/12/2021   CKD (chronic kidney disease) stage 3, GFR 30-59 ml/min (HCC) 03/08/2020   Constipation 03/08/2020   Essential hypertension 03/08/2020   GERD (gastroesophageal reflux disease) 03/08/2020   Gout flare 03/09/2020   Hyperlipidemia LDL goal <70 06/12/2021   Hypertension    Hyponatremia 03/08/2020   Ileus (HCC) 03/07/2020   Lower abdominal pain 09/01/2020   Mild AKI 06/12/2021   Nicotine dependence, chewing tobacco, w oth disorders 03/08/2020   NSTEMI (non-ST elevated myocardial infarction) (HCC) 06/10/2021    Past Surgical History:  Procedure Laterality Date   EYE SURGERY     HERNIA REPAIR     LEFT HEART CATH AND CORONARY ANGIOGRAPHY N/A 06/10/2021   Procedure: LEFT HEART CATH AND CORONARY ANGIOGRAPHY;  Surgeon: Court Dorn JINNY, MD;  Location: [facility] INVASIVE CV LAB;  Service: Cardiovascular;  Laterality: N/A;    SOCIAL HISTORY:  reports that he has never smoked. His smokeless tobacco use includes chew. He reports that he does not drink alcohol  and does not use drugs.  Allergies[1]  FAMILY HISTORY: Family History  Problem Relation Age of Onset   Colon cancer Neg Hx      Prior to Admission medications  Not on File    Physical Exam: There were no vitals filed for this visit.     General exam: Awake and nonverbal.  No meaningful interaction.  Cachectic frail diffuse muscle wasting and temporal wasting Respiratory system: Clear to auscultation. Respiratory effort normal. Cardiovascular system: S1 & S2 heard, RRR. No JVD, murmurs, rubs, gallops or clicks. No pedal edema. Gastrointestinal system: Abdomen is nondistended, soft and nontender. No organomegaly or masses  felt. Normal bowel sounds heard. Central nervous system: Unable to assess Extremities: Symmetric 5 x 5 power. Skin: No rashes, lesions or ulcers Psychiatry: Judgement and insight appear poor   There is no height or weight on  file to calculate BMI.      Labs on Admission: I have personally reviewed following labs and imaging studies  CBC: No results for input(s): WBC, NEUTROABS, HGB, HCT, MCV, PLT in the last 168 hours. Basic Metabolic Panel: No results for input(s): NA, K, CL, CO2, GLUCOSE, BUN, CREATININE, CALCIUM , MG, PHOS in the last 168 hours. GFR: CrCl cannot be calculated (Unknown ideal weight.). Liver Function Tests: No results for input(s): AST, ALT, ALKPHOS, BILITOT, PROT, ALBUMIN in the last 168 hours. No results for input(s): LIPASE, AMYLASE in the last 168 hours. No results for input(s): AMMONIA in the last 168 hours. Coagulation Profile: No results for input(s): INR, PROTIME in the last 168 hours. Cardiac Enzymes: No results for input(s): CKTOTAL, CKMB, CKMBINDEX, TROPONINI in the last 168 hours. BNP (last 3 results) No results for input(s): PROBNP in the last 8760 hours. HbA1C: No results for input(s): HGBA1C in the last 72 hours. CBG: Recent Labs  Lab 05/31/24 2034 05/31/24 2259 06/01/24 2010  GLUCAP 53* 90 86   Lipid Profile: No results for input(s): CHOL, HDL, LDLCALC, TRIG, CHOLHDL, LDLDIRECT in the last 72 hours. Thyroid  Function Tests: No results for input(s): TSH, T4TOTAL, FREET4, T3FREE, THYROIDAB in the last 72 hours. Anemia Panel: No results for input(s): VITAMINB12, FOLATE, FERRITIN, TIBC, IRON, RETICCTPCT in the last 72 hours. Urine analysis:    Component Value Date/Time   COLORURINE AMBER (A) 05/11/2024 1410   APPEARANCEUR HAZY (A) 05/11/2024 1410   LABSPEC 1.023 05/11/2024 1410   PHURINE 5.0 05/11/2024 1410   GLUCOSEU NEGATIVE 05/11/2024 1410   HGBUR SMALL (A) 05/11/2024 1410   BILIRUBINUR NEGATIVE 05/11/2024 1410   KETONESUR 5 (A) 05/11/2024 1410   PROTEINUR 100 (A) 05/11/2024 1410   NITRITE NEGATIVE 05/11/2024 1410   LEUKOCYTESUR NEGATIVE 05/11/2024 1410    Sepsis Labs: !!!!!!!!!!!!!!!!!!!!!!!!!!!!!!!!!!!!!!!!!!!! @LABRCNTIP (procalcitonin:4,lacticidven:4) )No results found for this or any previous visit (from the past 240 hours).   Radiological Exams on Admission: No results found.  Nutritional status  All images have been reviewed by me personally.    Assessment/Plan Principal Problem:   Influenza A    Comfort care/hospice Generalized weakness and physical deconditioning Influenza A infection Acute hypoxic respiratory failure, improved New onset atrial fibrillation with RVR CKD stage IIIb Essential hypertension Adult failure to thrive with severe protein calorie malnutrition Hypernatremia     -Patient is currently fully comfort care.  Was not approved for residential hospice.  TOC working on safe disposition       DVT prophylaxis: None    Code Status: Do not attempt resuscitation (DNR) - Comfort care     Time Spent: 65 minutes.  >50% of the time was devoted to discussing the patients care, assessment, plan and disposition with other care givers along with counseling the patient about the risks and benefits of treatment.    Burgess JAYSON Dare MD [facility]  If 7PM-7AM, please contact night-coverage   06/03/2024, 11:33 AM     [1] No Known Allergies  "

## 2024-06-03 NOTE — Discharge Summary (Signed)
 Physician Discharge Summary  Hector Lowe FMW:[id number] DOB: 08-06-1921 DOA: 05/11/2024  PCP: [name], MD  Admit date: 05/11/2024 Discharge date: 06/03/2024  Admitted From: Home Disposition:  Hospice  Recommendations for Outpatient Follow-up:  GIP admit    Discharge Condition: Stable CODE STATUS: DNR Diet recommendation: Comfort feeding  Brief/Interim Summary: Brief Narrative:   89 y.o. male with medical history significant for CKD 3, hypertension, gout. Patient was brought to the ED via EMS with reports of 1 week of progressive generalized weakness, sore throat, cough, nausea and vomiting and lower abdominal pain.  Over the past week he has barely had any oral intake, he has had vomiting with every oral intake, and has not been able to stay hydrated.  Patient was admitted with acute hypoxemic respiratory failure secondary to influenza A infection with likely superimposed pneumonia.  He also has new onset atrial fibrillation with RVR.  He currently continues to have significant amounts of weakness and poor oral intake with hypernatremia.   Transitioned to full comfort care on 05/18/24.  Residential hospice/[facility] unable to take patient due to positive flu test    Medical director at [facility] denied residential hospice, will reassess on Monday. Family can't take him home.    Patient is to be discharged.  Will be GIP admit  Assessment & Plan:  Comfort care/hospice Generalized weakness and physical deconditioning Influenza A infection Acute hypoxic respiratory failure, improved New onset atrial fibrillation with RVR CKD stage IIIb Essential hypertension Adult failure to thrive with severe protein calorie malnutrition Hypernatremia   -Patient is currently fully comfort care.  Was not approved for residential hospice.  TOC working on safe disposition    DVT prophylaxis: None    Code Status: Do not attempt resuscitation (DNR) - Comfort care Family  Communication:   DC  Subjective:  No new events overnight  Examination:  General exam: Awake and nonverbal.  No meaningful interaction.  Cachectic frail diffuse muscle wasting and temporal wasting Respiratory system: Clear to auscultation. Respiratory effort normal. Cardiovascular system: S1 & S2 heard, RRR. No JVD, murmurs, rubs, gallops or clicks. No pedal edema. Gastrointestinal system: Abdomen is nondistended, soft and nontender. No organomegaly or masses felt. Normal bowel sounds heard. Central nervous system: Unable to assess Extremities: Symmetric 5 x 5 power. Skin: No rashes, lesions or ulcers Psychiatry: Judgement and insight appear poor    Discharge Diagnoses:  Principal Problem:   Hypernatremia Active Problems:   Influenza A   Acute hypoxic respiratory failure (HCC)   Atrial fibrillation with RVR (HCC)      Discharge Exam: Vitals:   06/01/24 2052 06/02/24 2100  BP: 114/78 110/68  Pulse: 72 (!) 113  Resp: 18 18  Temp: 98.4 F (36.9 C) 97.7 F (36.5 C)  SpO2: 97% 100%   Vitals:   06/01/24 0435 06/01/24 1044 06/01/24 2052 06/02/24 2100  BP: 105/68 96/68 114/78 110/68  Pulse: (!) 113 (!) 111 72 (!) 113  Resp: 12  18 18   Temp: 97.9 F (36.6 C)  98.4 F (36.9 C) 97.7 F (36.5 C)  TempSrc: Oral  Oral Oral  SpO2: 97%  97% 100%  Weight:      Height:          Discharge Instructions   Allergies as of 06/03/2024   No Known Allergies      Medication List     STOP taking these medications    amLODipine  5 MG tablet Commonly known as: NORVASC    amoxicillin 500 MG  capsule Commonly known as: AMOXIL   aspirin  81 MG chewable tablet   atorvastatin  80 MG tablet Commonly known as: LIPITOR    DULoxetine 30 MG capsule Commonly known as: CYMBALTA   isosorbide  mononitrate 30 MG 24 hr tablet Commonly known as: IMDUR    linaclotide  290 MCG Caps capsule Commonly known as: Linzess    meclizine 12.5 MG tablet Commonly known as: ANTIVERT    metoprolol  tartrate 25 MG tablet Commonly known as: LOPRESSOR    nitroGLYCERIN  0.4 MG SL tablet Commonly known as: NITROSTAT    omeprazole  20 MG capsule Commonly known as: PRILOSEC   polyethylene glycol 17 g packet Commonly known as: MIRALAX  / GLYCOLAX    PRESERVISION AREDS PO        Allergies[1]  You were cared for by a hospitalist during your hospital stay. If you have any questions about your discharge medications or the care you received while you were in the hospital after you are discharged, you can call the unit and asked to speak with the hospitalist on call if the hospitalist that took care of you is not available. Once you are discharged, your primary care physician will handle any further medical issues. Please note that no refills for any discharge medications will be authorized once you are discharged, as it is imperative that you return to your primary care physician (or establish a relationship with a primary care physician if you do not have one) for your aftercare needs so that they can reassess your need for medications and monitor your lab values.  You were cared for by a hospitalist during your hospital stay. If you have any questions about your discharge medications or the care you received while you were in the hospital after you are discharged, you can call the unit and asked to speak with the hospitalist on call if the hospitalist that took care of you is not available. Once you are discharged, your primary care physician will handle any further medical issues. Please note that NO REFILLS for any discharge medications will be authorized once you are discharged, as it is imperative that you return to your primary care physician (or establish a relationship with a primary care physician if you do not have one) for your aftercare needs so that they can reassess your need for medications and monitor your lab values.  Please request your Prim.MD to go over all Hospital Tests  and Procedure/Radiological results at the follow up, please get all Hospital records sent to your Prim MD by signing hospital release before you go home.  Get CBC, CMP, 2 view Chest X ray checked  by Primary MD during your next visit or SNF MD in 5-7 days ( we routinely change or add medications that can affect your baseline labs and fluid status, therefore we recommend that you get the mentioned basic workup next visit with your PCP, your PCP may decide not to get them or add new tests based on their clinical decision)  On your next visit with your primary care physician please Get Medicines reviewed and adjusted.  If you experience worsening of your admission symptoms, develop shortness of breath, life threatening emergency, suicidal or homicidal thoughts you must seek medical attention immediately by calling 911 or calling your MD immediately  if symptoms less severe.  You Must read complete instructions/literature along with all the possible adverse reactions/side effects for all the Medicines you take and that have been prescribed to you. Take any new Medicines after you have completely understood and accpet all  the possible adverse reactions/side effects.   Do not drive, operate heavy machinery, perform activities at heights, swimming or participation in water activities or provide baby sitting services if your were admitted for syncope or siezures until you have seen by Primary MD or a Neurologist and advised to do so again.  Do not drive when taking Pain medications.   Procedures/Studies: ECHOCARDIOGRAM COMPLETE Result Date: 05/12/2024    ECHOCARDIOGRAM REPORT   Patient Name:   Hector Lowe [facility] Date of Exam: 05/12/2024 Medical Rec #:  [id number]      Height:       70.0 in Accession #:    [id number]     Weight:       102.3 lb Date of Birth:  Jan 24, 1922     BSA:          1.570 m Patient Age:    89 years      BP:           124/87 mmHg Patient Gender: M              HR:           87 bpm. Exam  Location:  [facility] Procedure: 2D Echo, Cardiac Doppler and Color Doppler (Both Spectral and Color            Flow Doppler were utilized during procedure). Indications:    Atrial Fibrillation I48.91  History:        Patient has prior history of Echocardiogram examinations. CAD                 and Previous Myocardial Infarction, Arrythmias:Atrial                 Fibrillation; Risk Factors:Hypertension and Dyslipidemia.  Sonographer:    Aida Pizza RCS Referring Phys: [phone] EJIROGHENE E EMOKPAE IMPRESSIONS  1. Left ventricular ejection fraction, by estimation, is 60 to 65%. The left ventricle has normal function. The left ventricle has no regional wall motion abnormalities. There is severe asymmetric left ventricular hypertrophy of the septal segment. Left  ventricular diastolic function could not be evaluated.  2. Right ventricular systolic function is normal. The right ventricular size is normal. There is mildly elevated pulmonary artery systolic pressure. The estimated right ventricular systolic pressure is 42.5 mmHg.  3. Left atrial size was mild to moderately dilated.  4. Right atrial size was severely dilated.  5. The mitral valve is normal in structure. Mild to moderate mitral valve regurgitation. No evidence of mitral stenosis.  6. The tricuspid valve is abnormal. Tricuspid valve regurgitation is moderate to severe.  7. The aortic valve is tricuspid. There is moderate calcification of the aortic valve. There is moderate thickening of the aortic valve. Aortic valve regurgitation is not visualized. No aortic stenosis is present. Aortic valve mean gradient measures 3.0  mmHg.  8. The inferior vena cava is dilated in size with <50% respiratory variability, suggesting right atrial pressure of 15 mmHg. Comparison(s): No significant change from prior study. FINDINGS  Left Ventricle: Left ventricular ejection fraction, by estimation, is 60 to 65%. The left ventricle has normal function. The left ventricle has no  regional wall motion abnormalities. Strain was performed and the global longitudinal strain is indeterminate. The left ventricular internal cavity size was normal in size. There is severe asymmetric left ventricular hypertrophy of the septal segment. Left ventricular diastolic function could not be evaluated due to atrial fibrillation. Left ventricular diastolic function could not be evaluated. Right Ventricle: The right  ventricular size is normal. No increase in right ventricular wall thickness. Right ventricular systolic function is normal. There is mildly elevated pulmonary artery systolic pressure. The tricuspid regurgitant velocity is 2.62  m/s, and with an assumed right atrial pressure of 15 mmHg, the estimated right ventricular systolic pressure is 42.5 mmHg. Left Atrium: Left atrial size was mild to moderately dilated. Right Atrium: Right atrial size was severely dilated. Pericardium: There is no evidence of pericardial effusion. Mitral Valve: The mitral valve is normal in structure. Mild to moderate mitral valve regurgitation. No evidence of mitral valve stenosis. Tricuspid Valve: The tricuspid valve is abnormal. Tricuspid valve regurgitation is moderate to severe. No evidence of tricuspid stenosis. Aortic Valve: The aortic valve is tricuspid. There is moderate calcification of the aortic valve. There is moderate thickening of the aortic valve. Aortic valve regurgitation is not visualized. No aortic stenosis is present. Aortic valve mean gradient measures 3.0 mmHg. Aortic valve peak gradient measures 6.9 mmHg. Aortic valve area, by VTI measures 1.13 cm. Pulmonic Valve: The pulmonic valve was normal in structure. Pulmonic valve regurgitation is mild to moderate. No evidence of pulmonic stenosis. Aorta: The aortic root is normal in size and structure. Venous: The inferior vena cava is dilated in size with less than 50% respiratory variability, suggesting right atrial pressure of 15 mmHg. IAS/Shunts: No  atrial level shunt detected by color flow Doppler. Additional Comments: 3D was performed not requiring image post processing on an independent workstation and was indeterminate.  LEFT VENTRICLE PLAX 2D LVIDd:         3.10 cm LVIDs:         2.20 cm LV PW:         1.00 cm LV IVS:        1.50 cm LVOT diam:     1.70 cm LV SV:         24 LV SV Index:   15 LVOT Area:     2.27 cm  RIGHT VENTRICLE RV S prime:     9.20 cm/s TAPSE (M-mode): 1.1 cm LEFT ATRIUM             Index        RIGHT ATRIUM           Index LA diam:        3.10 cm 1.97 cm/m   RA Area:     31.40 cm LA Vol (A2C):   60.6 ml 38.60 ml/m  RA Volume:   117.00 ml 74.53 ml/m LA Vol (A4C):   61.5 ml 39.18 ml/m LA Biplane Vol: 60.3 ml 38.41 ml/m  AORTIC VALVE AV Area (Vmax):    0.94 cm AV Area (Vmean):   1.11 cm AV Area (VTI):     1.13 cm AV Vmax:           131.00 cm/s AV Vmean:          75.400 cm/s AV VTI:            0.211 m AV Peak Grad:      6.9 mmHg AV Mean Grad:      3.0 mmHg LVOT Vmax:         54.00 cm/s LVOT Vmean:        36.900 cm/s LVOT VTI:          0.105 m LVOT/AV VTI ratio: 0.50  AORTA Ao Root diam: 3.20 cm MITRAL VALVE  TRICUSPID VALVE MV Area (PHT): 4.80 cm       TR Peak grad:   27.5 mmHg MV Decel Time: 158 msec       TR Vmax:        262.00 cm/s MR Peak grad:    117.9 mmHg MR Mean grad:    74.0 mmHg    SHUNTS MR Vmax:         543.00 cm/s  Systemic VTI:  0.10 m MR Vmean:        399.0 cm/s   Systemic Diam: 1.70 cm MR PISA:         4.02 cm MR PISA Eff ROA: 16 mm MR PISA Radius:  0.80 cm MV E velocity: 110.00 cm/s Vishnu Priya Mallipeddi Electronically signed by Diannah Late Mallipeddi Signature Date/Time: 05/12/2024/3:59:04 PM    Final    CT CHEST ABDOMEN PELVIS WO CONTRAST Result Date: 05/11/2024 CLINICAL DATA:  Pneumonia. EXAM: CT CHEST, ABDOMEN AND PELVIS WITHOUT CONTRAST TECHNIQUE: Multidetector CT imaging of the chest, abdomen and pelvis was performed following the standard protocol without IV contrast. RADIATION  DOSE REDUCTION: This exam was performed according to the departmental dose-optimization program which includes automated exposure control, adjustment of the mA and/or kV according to patient size and/or use of iterative reconstruction technique. COMPARISON:  Chest radiograph dated 05/11/2024. FINDINGS: Evaluation of this exam is limited in the absence of intravenous contrast as well as due to respiratory motion. CT CHEST FINDINGS Cardiovascular: . there is no cardiomegaly. Mild dilatation of the right atrium. There is coronary vascular calcification. Moderate atherosclerotic calcification of the thoracic aorta. No intimal dilatation. The central pulmonary arteries are grossly unremarkable. Mediastinum/Nodes: No hilar or mediastinal adenopathy. The esophagus is grossly unremarkable. No mediastinal fluid collection. Lungs/Pleura: Moderate right and small left pleural effusions. There is partial compressive atelectasis of the lower lobes. Pneumonia is not excluded. There is diffuse interstitial prominence predominantly involving the lower lungs suggestive of edema. No pneumothorax. The central airways are patent. Musculoskeletal: Osteopenia with degenerative changes of the spine. No acute osseous pathology. CT ABDOMEN PELVIS FINDINGS No intra-abdominal free air or free fluid. Hepatobiliary: The liver is unremarkable. No biliary dilatation. No calcified gallstone. Pancreas: Moderate atrophy of the pancreas. No active inflammatory changes. No dilatation of the main pancreatic duct. Spleen: Normal in size without focal abnormality. Adrenals/Urinary Tract: The adrenal glands unremarkable. There is no hydronephrosis or nephrolithiasis on either side. There is trabeculated appearance of the bladder wall consistent with chronic bladder outlet obstruction. Stomach/Bowel: There is no bowel obstruction or active inflammation. The appendix is normal. Vascular/Lymphatic: Advanced aortoiliac atherosclerotic disease. The IVC is  unremarkable. No portal venous gas. There is no adenopathy. Reproductive: The prostate is grossly unremarkable. Other: None Musculoskeletal: Osteopenia with degenerative changes of the spine. No acute osseous pathology. IMPRESSION: 1. Moderate right and small left pleural effusions with partial compressive atelectasis of the lower lobes. Pneumonia is not excluded. 2. No acute intra-abdominal or pelvic pathology. No bowel obstruction. Normal appendix. 3.  Aortic Atherosclerosis (ICD10-I70.0). Electronically Signed   By: Vanetta Chou M.D.   On: 05/11/2024 15:19   DG Chest 1 View Result Date: 05/11/2024 EXAM: 1 VIEW(S) XRAY OF THE CHEST 05/11/2024 12:49:00 PM COMPARISON: AP radiograph of the chest dated 06/23/2021. CLINICAL HISTORY: SOB FINDINGS: LUNGS AND PLEURA: Diffuse peripheral predominant interstitial opacities. Trace bilateral pleural effusions. No pneumothorax. HEART AND MEDIASTINUM: Mild cardiomegaly. Aortic atherosclerosis. BONES AND SOFT TISSUES: Degenerative changes of the left shoulder. IMPRESSION: 1. Diffuse peripheral predominant interstitial opacities and  trace bilateral pleural effusions. 2. Mild cardiomegaly and aortic atherosclerosis. Electronically signed by: Evalene Coho MD 05/11/2024 12:53 PM EST RP Workstation: [id number]     The results of significant diagnostics from this hospitalization (including imaging, microbiology, ancillary and laboratory) are listed below for reference.     Microbiology: No results found for this or any previous visit (from the past 240 hours).   Labs: BNP (last 3 results) No results for input(s): BNP in the last 8760 hours. Basic Metabolic Panel: No results for input(s): NA, K, CL, CO2, GLUCOSE, BUN, CREATININE, CALCIUM , MG, PHOS in the last 168 hours. Liver Function Tests: No results for input(s): AST, ALT, ALKPHOS, BILITOT, PROT, ALBUMIN in the last 168 hours. No results for input(s): LIPASE, AMYLASE  in the last 168 hours. No results for input(s): AMMONIA in the last 168 hours. CBC: No results for input(s): WBC, NEUTROABS, HGB, HCT, MCV, PLT in the last 168 hours. Cardiac Enzymes: No results for input(s): CKTOTAL, CKMB, CKMBINDEX, TROPONINI in the last 168 hours. BNP: Invalid input(s): POCBNP CBG: Recent Labs  Lab 05/31/24 2034 05/31/24 2259 06/01/24 2010  GLUCAP 53* 90 86   D-Dimer No results for input(s): DDIMER in the last 72 hours. Hgb A1c No results for input(s): HGBA1C in the last 72 hours. Lipid Profile No results for input(s): CHOL, HDL, LDLCALC, TRIG, CHOLHDL, LDLDIRECT in the last 72 hours. Thyroid  function studies No results for input(s): TSH, T4TOTAL, T3FREE, THYROIDAB in the last 72 hours.  Invalid input(s): FREET3 Anemia work up No results for input(s): VITAMINB12, FOLATE, FERRITIN, TIBC, IRON, RETICCTPCT in the last 72 hours. Urinalysis    Component Value Date/Time   COLORURINE AMBER (A) 05/11/2024 1410   APPEARANCEUR HAZY (A) 05/11/2024 1410   LABSPEC 1.023 05/11/2024 1410   PHURINE 5.0 05/11/2024 1410   GLUCOSEU NEGATIVE 05/11/2024 1410   HGBUR SMALL (A) 05/11/2024 1410   BILIRUBINUR NEGATIVE 05/11/2024 1410   KETONESUR 5 (A) 05/11/2024 1410   PROTEINUR 100 (A) 05/11/2024 1410   NITRITE NEGATIVE 05/11/2024 1410   LEUKOCYTESUR NEGATIVE 05/11/2024 1410   Sepsis Labs No results for input(s): WBC in the last 168 hours.  Invalid input(s): PROCALCITONIN, LACTICIDVEN Microbiology No results found for this or any previous visit (from the past 240 hours).   Time coordinating discharge:  I have spent 35 minutes face to face with the patient and on the ward discussing the patients care, assessment, plan and disposition with other care givers. >50% of the time was devoted counseling the patient about the risks and benefits of treatment/Discharge disposition and coordinating care.    SIGNED:   Burgess JAYSON Dare, MD  [facility] 06/03/2024, 8:58 AM   If 7PM-7AM, please contact night-coverage      [1] No Known Allergies

## 2024-06-03 NOTE — Progress Notes (Signed)
 Physical Therapy Treatment Patient Details Name: Hector Lowe MRN: [id number] DOB: 26-Nov-1921 Today's Date: 06/03/2024   History of Present Illness Hector Lowe is a 89 y.o. male with medical history significant for CKD 3, hypertension, gout.  Patient was brought to the ED via EMS with reports of 1 week of progressive generalized weakness, sore throat, cough, nausea and vomiting and lower abdominal pain.  Over the past week he has barely had any oral intake, he has had vomiting with every oral intake, and has not been able to stay hydrated.  He has also had cough productive of thick sputum.  No chest pain.  Per family, patient initially declined coming to the ED, today he was unable to get out of bed felt worse and so agreed to come in.  Saw his outpatient provider a few days ago, was diagnosed with a sinus infection, started on a course of penicillin.    PT Comments  Patient very lethargic throughout session. Received supine in bed. Attempt to arouse with sternal rub unsuccessful. However, pt was moving UE/LE, making noises, but unable to produce comprehensible words and would not open eyes with verbal cues. Attempted to arouse with mobility. Pt with very mild assist during bed mobility but ultimately required moderate assist to get EOB. 3 partial stands in session, one attempt with RW, and remaining 2 without as pt did not grip RW well. Unable to fully stand and required max assist. Mod/max assist to return to supine in bed and boost to [facility]. Pt left with call button within reach, bed alarm set and nurse at bedside at EOS. Patient will benefit from continued skilled physical therapy in recommended setting in order to address current level of function.    If plan is discharge home, recommend the following: A lot of help with bathing/dressing/bathroom;A lot of help with walking and/or transfers;Help with stairs or ramp for entrance;Assist for transportation;Assistance with cooking/housework   Can  travel by private vehicle        Equipment Recommendations  None recommended by PT    Recommendations for Other Services       Precautions / Restrictions Precautions Precautions: Fall Recall of Precautions/Restrictions: Intact Restrictions Weight Bearing Restrictions Per Provider Order: No     Mobility  Bed Mobility Overal bed mobility: Needs Assistance Bed Mobility: Supine to Sit, Sit to Supine     Supine to sit: Mod assist Sit to supine: Mod assist   General bed mobility comments: pt very lethargic at start of session, arousal does not improve with mobility, pt slightly assisted with bed mobility, eyes remained close throughout but moving limbs, difficulty with verbal responses    Transfers Overall transfer level: Needs assistance Equipment used: Rolling walker (2 wheels), None Transfers: Sit to/from Stand Sit to Stand: Max assist, Mod assist           General transfer comment: pt very lethargic throughout session, arousal does not improve with mobility, pt with little assist with 3 partial stands during session, with and without RW,  eyes remained close throughout but moving limbs, difficulty with verbal responses    Ambulation/Gait               General Gait Details: Unsafe to attempt this date   Stairs             Wheelchair Mobility     Tilt Bed    Modified Rankin (Stroke Patients Only)       Balance Overall balance assessment: Needs assistance  Sitting-balance support: Feet supported, No upper extremity supported Sitting balance-Leahy Scale: Poor Sitting balance - Comments: poor/fair seated at EOB   Standing balance support: Reliant on assistive device for balance, During functional activity, Bilateral upper extremity supported Standing balance-Leahy Scale: Poor Standing balance comment: poor using RW                            Communication Communication Communication: Impaired Factors Affecting Communication:  Hearing impaired;Difficulty expressing self;Reduced clarity of speech  Cognition Arousal: Lethargic     PT - Cognitive impairments: Difficult to assess Difficult to assess due to: Level of arousal                       Following commands: Impaired Following commands impaired: Follows one step commands inconsistently    Cueing Cueing Techniques: Verbal cues, Tactile cues, Gestural cues, Visual cues  Exercises      General Comments        Pertinent Vitals/Pain Pain Assessment Pain Assessment: Faces Faces Pain Scale: Hurts a little bit Pain Location: Moaning in pain Pain Descriptors / Indicators: Discomfort, Moaning Pain Intervention(s): Limited activity within patient's tolerance, Repositioned    Home Living                          Prior Function            PT Goals (current goals can now be found in the care plan section) Acute Rehab PT Goals Patient Stated Goal: return home after rehab PT Goal Formulation: With patient/family Time For Goal Achievement: 06/10/24 Potential to Achieve Goals: Fair Progress towards PT goals: Progressing toward goals    Frequency    Min 3X/week      PT Plan      Co-evaluation              AM-PAC PT 6 Clicks Mobility   Outcome Measure  Help needed turning from your back to your side while in a flat bed without using bedrails?: A Lot Help needed moving from lying on your back to sitting on the side of a flat bed without using bedrails?: A Lot Help needed moving to and from a bed to a chair (including a wheelchair)?: Total Help needed standing up from a chair using your arms (e.g., wheelchair or bedside chair)?: A Lot Help needed to walk in hospital room?: Total Help needed climbing 3-5 steps with a railing? : Total 6 Click Score: 9    End of Session Equipment Utilized During Treatment: Gait belt Activity Tolerance: Patient limited by fatigue;Patient limited by lethargy Patient left: with call  bell/phone within reach;in bed;with bed alarm set Nurse Communication: Mobility status PT Visit Diagnosis: Unsteadiness on feet (R26.81);Other abnormalities of gait and mobility (R26.89);Muscle weakness (generalized) (M62.81)     Time: 9080-9063 PT Time Calculation (min) (ACUTE ONLY): 17 min  Charges:    $Therapeutic Activity: 8-22 mins PT General Charges $$ ACUTE PT VISIT: 1 Visit                     12:10 PM, 06/03/2024 Sky Primo Powell-Butler, PT, DPT [place] [facility]

## 2024-06-03 NOTE — Plan of Care (Signed)
°  Problem: Coping: Goal: Ability to identify and develop effective coping behavior will improve Outcome: Progressing   Problem: Respiratory: Goal: Verbalizations of increased ease of respirations will increase Outcome: Progressing   Problem: Pain Management: Goal: Satisfaction with pain management regimen will improve Outcome: Progressing

## 2024-06-03 NOTE — Plan of Care (Signed)
" °  Problem: Pain Management: Goal: Satisfaction with pain management regimen will improve Outcome: Progressing   Problem: Role Relationship: Goal: Family's ability to cope with current situation will improve Outcome: Progressing   "

## 2024-06-04 DIAGNOSIS — J101 Influenza due to other identified influenza virus with other respiratory manifestations: Secondary | ICD-10-CM

## 2024-06-04 DIAGNOSIS — Z515 Encounter for palliative care: Principal | ICD-10-CM

## 2024-06-04 MED ORDER — LORAZEPAM 1 MG PO TABS: 1.0000 mg | ORAL_TABLET | Freq: Four times a day (QID) | ORAL | Status: AC | PRN

## 2024-06-04 MED ORDER — MORPHINE SULFATE (CONCENTRATE) 10 MG /0.5 ML PO SOLN: 10.0000 mg | ORAL | Status: AC | PRN

## 2024-06-04 MED ORDER — HALOPERIDOL LACTATE 2 MG/ML PO CONC: 0.5000 mg | ORAL | Status: AC | PRN

## 2024-06-04 MED ORDER — ONDANSETRON 4 MG PO TBDP: 4.0000 mg | ORAL_TABLET | Freq: Four times a day (QID) | ORAL | Status: AC | PRN

## 2024-06-04 MED ORDER — SCOPOLAMINE 1 MG/3DAYS TD PT72: 1.0000 | MEDICATED_PATCH | TRANSDERMAL | Status: AC

## 2024-06-04 MED ORDER — ACETAMINOPHEN 325 MG PO TABS: 650.0000 mg | ORAL_TABLET | Freq: Four times a day (QID) | ORAL | Status: AC | PRN

## 2024-06-04 MED ORDER — SCOPOLAMINE 1 MG/3DAYS TD PT72
1.0000 | MEDICATED_PATCH | TRANSDERMAL | Status: DC
  Administered 2024-06-04: 1 mg via TRANSDERMAL
  Filled 2024-06-04: qty 1

## 2024-06-04 NOTE — Plan of Care (Signed)
" °  Problem: Pain Management: Goal: Satisfaction with pain management regimen will improve Outcome: Progressing   Problem: Respiratory: Goal: Verbalizations of increased ease of respirations will increase Outcome: Progressing   Problem: Safety: Goal: Ability to remain free from injury will improve Outcome: Progressing   Problem: Respiratory: Goal: Verbalizations of increased ease of respirations will increase Outcome: Progressing   "

## 2024-06-04 NOTE — Progress Notes (Signed)
 Palliative: Chart review completed.  Face-to-face discussion with transition of care team related to patient condition, needs, disposition.    Plan: Mr. Enderle is being discharged to [facility] house today for comfort and dignity at end-of-life.  No needs at this time.   DNR/goldenrod form is on chart.   No charge Lorenza Birkenhead, NP Palliative medicine team Team phone [phone]

## 2024-06-04 NOTE — Discharge Summary (Signed)
 " Physician Discharge Summary   Patient: Hector Lowe MRN: [id number] DOB: 25-Jan-1922  Admit date:     06/03/2024  Discharge date: 06/04/2024  Discharge Physician: Eric Nunnery   PCP: [name], MD   Recommendations at discharge:  Continue comfort care and symptomatic management.  Discharge Diagnoses: Principal Problem:   Influenza A   Brief Narrative:    89 y.o. male with medical history significant for CKD 3, hypertension, gout. Patient was brought to the ED via EMS with reports of 1 week of progressive generalized weakness, sore throat, cough, nausea and vomiting and lower abdominal pain.  Over the past week he has barely had any oral intake, he has had vomiting with every oral intake, and has not been able to stay hydrated.  Patient was admitted with acute hypoxemic respiratory failure secondary to influenza A infection with likely superimposed pneumonia.  He also has new onset atrial fibrillation with RVR.  He currently continues to have significant amounts of weakness and poor oral intake with hypernatremia.   Transitioned to full comfort care on 05/18/24.  Residential hospice/[facility] unable to take patient due to positive flu test    Medical director at [facility] denied residential hospice, will reassess on Monday. Family can't take him home.    Patient is to be discharged.  Will be GIP admit    Assessment and Plan: Comfort care/hospice Generalized weakness and physical deconditioning Influenza A infection Acute hypoxic respiratory failure, improved New onset atrial fibrillation with RVR CKD stage IIIb/dehydration/hypernatremia Essential hypertension Adult failure to thrive with severe protein calorie malnutrition    -Patient is currently full comfort care and symptomatic management. Bed available at [facility] house and patient will be transferred for further hospice management.    Consultants: palliative care Procedures performed: see below for x-ray reports   Disposition: Hospice care Diet recommendation: comfort feeding   DISCHARGE MEDICATION: Allergies as of 06/04/2024   No Known Allergies      Medication List     TAKE these medications    acetaminophen  325 MG tablet Commonly known as: TYLENOL  Take 2 tablets (650 mg total) by mouth every 6 (six) hours as needed for mild pain (pain score 1-3) or fever (or Fever >/= 101).   haloperidol  2 MG/ML solution Commonly known as: HALDOL  Place 0.3 mLs (0.6 mg total) under the tongue every 4 (four) hours as needed for agitation (or delirium).   LORazepam  1 MG tablet Commonly known as: ATIVAN  Take 1 tablet (1 mg total) by mouth every 6 (six) hours as needed for anxiety or seizure.   morphine  CONCENTRATE 10 mg / 0.5 ml concentrated solution Take 0.5 mLs (10 mg total) by mouth every 4 (four) hours as needed for severe pain (pain score 7-10).   ondansetron  4 MG disintegrating tablet Commonly known as: ZOFRAN -ODT Take 1 tablet (4 mg total) by mouth every 6 (six) hours as needed for nausea.   scopolamine  1 MG/3DAYS Commonly known as: TRANSDERM-SCOP Place 1 patch (1 mg total) onto the skin every 3 (three) days.        Discharge Exam:  General exam: Awake and nonverbal.  No meaningful interaction.  Cachectic frail diffuse muscle wasting and temporal wasting Respiratory system: Clear to auscultation. Respiratory effort normal. Cardiovascular system: S1 & S2 heard, RRR. No JVD, murmurs, rubs, gallops or clicks. No pedal edema. Gastrointestinal system: Abdomen is nondistended, soft and nontender. No organomegaly or masses felt. Normal bowel sounds heard. Central nervous system: Unable to assess Extremities: Symmetric 5  x 5 power. Skin: No rashes, lesions or ulcers Psychiatry: Judgement and insight appear poor  Condition at discharge: stable  The results of significant diagnostics from this hospitalization (including imaging, microbiology, ancillary and laboratory) are listed below for  reference.   Imaging Studies: ECHOCARDIOGRAM COMPLETE Result Date: 05/12/2024    ECHOCARDIOGRAM REPORT   Patient Name:   Hector Lowe [facility] Date of Exam: 05/12/2024 Medical Rec #:  [id number]      Height:       70.0 in Accession #:    [id number]     Weight:       102.3 lb Date of Birth:  01/30/1922     BSA:          1.570 m Patient Age:    89 years      BP:           124/87 mmHg Patient Gender: M              HR:           87 bpm. Exam Location:  [facility] Procedure: 2D Echo, Cardiac Doppler and Color Doppler (Both Spectral and Color            Flow Doppler were utilized during procedure). Indications:    Atrial Fibrillation I48.91  History:        Patient has prior history of Echocardiogram examinations. CAD                 and Previous Myocardial Infarction, Arrythmias:Atrial                 Fibrillation; Risk Factors:Hypertension and Dyslipidemia.  Sonographer:    Aida Pizza RCS Referring Phys: [phone] EJIROGHENE E EMOKPAE IMPRESSIONS  1. Left ventricular ejection fraction, by estimation, is 60 to 65%. The left ventricle has normal function. The left ventricle has no regional wall motion abnormalities. There is severe asymmetric left ventricular hypertrophy of the septal segment. Left  ventricular diastolic function could not be evaluated.  2. Right ventricular systolic function is normal. The right ventricular size is normal. There is mildly elevated pulmonary artery systolic pressure. The estimated right ventricular systolic pressure is 42.5 mmHg.  3. Left atrial size was mild to moderately dilated.  4. Right atrial size was severely dilated.  5. The mitral valve is normal in structure. Mild to moderate mitral valve regurgitation. No evidence of mitral stenosis.  6. The tricuspid valve is abnormal. Tricuspid valve regurgitation is moderate to severe.  7. The aortic valve is tricuspid. There is moderate calcification of the aortic valve. There is moderate thickening of the aortic valve. Aortic valve  regurgitation is not visualized. No aortic stenosis is present. Aortic valve mean gradient measures 3.0  mmHg.  8. The inferior vena cava is dilated in size with <50% respiratory variability, suggesting right atrial pressure of 15 mmHg. Comparison(s): No significant change from prior study. FINDINGS  Left Ventricle: Left ventricular ejection fraction, by estimation, is 60 to 65%. The left ventricle has normal function. The left ventricle has no regional wall motion abnormalities. Strain was performed and the global longitudinal strain is indeterminate. The left ventricular internal cavity size was normal in size. There is severe asymmetric left ventricular hypertrophy of the septal segment. Left ventricular diastolic function could not be evaluated due to atrial fibrillation. Left ventricular diastolic function could not be evaluated. Right Ventricle: The right ventricular size is normal. No increase in right ventricular wall thickness. Right ventricular systolic function is normal. There is  mildly elevated pulmonary artery systolic pressure. The tricuspid regurgitant velocity is 2.62  m/s, and with an assumed right atrial pressure of 15 mmHg, the estimated right ventricular systolic pressure is 42.5 mmHg. Left Atrium: Left atrial size was mild to moderately dilated. Right Atrium: Right atrial size was severely dilated. Pericardium: There is no evidence of pericardial effusion. Mitral Valve: The mitral valve is normal in structure. Mild to moderate mitral valve regurgitation. No evidence of mitral valve stenosis. Tricuspid Valve: The tricuspid valve is abnormal. Tricuspid valve regurgitation is moderate to severe. No evidence of tricuspid stenosis. Aortic Valve: The aortic valve is tricuspid. There is moderate calcification of the aortic valve. There is moderate thickening of the aortic valve. Aortic valve regurgitation is not visualized. No aortic stenosis is present. Aortic valve mean gradient measures 3.0 mmHg.  Aortic valve peak gradient measures 6.9 mmHg. Aortic valve area, by VTI measures 1.13 cm. Pulmonic Valve: The pulmonic valve was normal in structure. Pulmonic valve regurgitation is mild to moderate. No evidence of pulmonic stenosis. Aorta: The aortic root is normal in size and structure. Venous: The inferior vena cava is dilated in size with less than 50% respiratory variability, suggesting right atrial pressure of 15 mmHg. IAS/Shunts: No atrial level shunt detected by color flow Doppler. Additional Comments: 3D was performed not requiring image post processing on an independent workstation and was indeterminate.  LEFT VENTRICLE PLAX 2D LVIDd:         3.10 cm LVIDs:         2.20 cm LV PW:         1.00 cm LV IVS:        1.50 cm LVOT diam:     1.70 cm LV SV:         24 LV SV Index:   15 LVOT Area:     2.27 cm  RIGHT VENTRICLE RV S prime:     9.20 cm/s TAPSE (M-mode): 1.1 cm LEFT ATRIUM             Index        RIGHT ATRIUM           Index LA diam:        3.10 cm 1.97 cm/m   RA Area:     31.40 cm LA Vol (A2C):   60.6 ml 38.60 ml/m  RA Volume:   117.00 ml 74.53 ml/m LA Vol (A4C):   61.5 ml 39.18 ml/m LA Biplane Vol: 60.3 ml 38.41 ml/m  AORTIC VALVE AV Area (Vmax):    0.94 cm AV Area (Vmean):   1.11 cm AV Area (VTI):     1.13 cm AV Vmax:           131.00 cm/s AV Vmean:          75.400 cm/s AV VTI:            0.211 m AV Peak Grad:      6.9 mmHg AV Mean Grad:      3.0 mmHg LVOT Vmax:         54.00 cm/s LVOT Vmean:        36.900 cm/s LVOT VTI:          0.105 m LVOT/AV VTI ratio: 0.50  AORTA Ao Root diam: 3.20 cm MITRAL VALVE                  TRICUSPID VALVE MV Area (PHT): 4.80 cm       TR Peak grad:  27.5 mmHg MV Decel Time: 158 msec       TR Vmax:        262.00 cm/s MR Peak grad:    117.9 mmHg MR Mean grad:    74.0 mmHg    SHUNTS MR Vmax:         543.00 cm/s  Systemic VTI:  0.10 m MR Vmean:        399.0 cm/s   Systemic Diam: 1.70 cm MR PISA:         4.02 cm MR PISA Eff ROA: 16 mm MR PISA Radius:  0.80 cm MV  E velocity: 110.00 cm/s Vishnu Priya Mallipeddi Electronically signed by Diannah Late Mallipeddi Signature Date/Time: 05/12/2024/3:59:04 PM    Final    CT CHEST ABDOMEN PELVIS WO CONTRAST Result Date: 05/11/2024 CLINICAL DATA:  Pneumonia. EXAM: CT CHEST, ABDOMEN AND PELVIS WITHOUT CONTRAST TECHNIQUE: Multidetector CT imaging of the chest, abdomen and pelvis was performed following the standard protocol without IV contrast. RADIATION DOSE REDUCTION: This exam was performed according to the departmental dose-optimization program which includes automated exposure control, adjustment of the mA and/or kV according to patient size and/or use of iterative reconstruction technique. COMPARISON:  Chest radiograph dated 05/11/2024. FINDINGS: Evaluation of this exam is limited in the absence of intravenous contrast as well as due to respiratory motion. CT CHEST FINDINGS Cardiovascular: . there is no cardiomegaly. Mild dilatation of the right atrium. There is coronary vascular calcification. Moderate atherosclerotic calcification of the thoracic aorta. No intimal dilatation. The central pulmonary arteries are grossly unremarkable. Mediastinum/Nodes: No hilar or mediastinal adenopathy. The esophagus is grossly unremarkable. No mediastinal fluid collection. Lungs/Pleura: Moderate right and small left pleural effusions. There is partial compressive atelectasis of the lower lobes. Pneumonia is not excluded. There is diffuse interstitial prominence predominantly involving the lower lungs suggestive of edema. No pneumothorax. The central airways are patent. Musculoskeletal: Osteopenia with degenerative changes of the spine. No acute osseous pathology. CT ABDOMEN PELVIS FINDINGS No intra-abdominal free air or free fluid. Hepatobiliary: The liver is unremarkable. No biliary dilatation. No calcified gallstone. Pancreas: Moderate atrophy of the pancreas. No active inflammatory changes. No dilatation of the main pancreatic duct.  Spleen: Normal in size without focal abnormality. Adrenals/Urinary Tract: The adrenal glands unremarkable. There is no hydronephrosis or nephrolithiasis on either side. There is trabeculated appearance of the bladder wall consistent with chronic bladder outlet obstruction. Stomach/Bowel: There is no bowel obstruction or active inflammation. The appendix is normal. Vascular/Lymphatic: Advanced aortoiliac atherosclerotic disease. The IVC is unremarkable. No portal venous gas. There is no adenopathy. Reproductive: The prostate is grossly unremarkable. Other: None Musculoskeletal: Osteopenia with degenerative changes of the spine. No acute osseous pathology. IMPRESSION: 1. Moderate right and small left pleural effusions with partial compressive atelectasis of the lower lobes. Pneumonia is not excluded. 2. No acute intra-abdominal or pelvic pathology. No bowel obstruction. Normal appendix. 3.  Aortic Atherosclerosis (ICD10-I70.0). Electronically Signed   By: Vanetta Chou M.D.   On: 05/11/2024 15:19   DG Chest 1 View Result Date: 05/11/2024 EXAM: 1 VIEW(S) XRAY OF THE CHEST 05/11/2024 12:49:00 PM COMPARISON: AP radiograph of the chest dated 06/23/2021. CLINICAL HISTORY: SOB FINDINGS: LUNGS AND PLEURA: Diffuse peripheral predominant interstitial opacities. Trace bilateral pleural effusions. No pneumothorax. HEART AND MEDIASTINUM: Mild cardiomegaly. Aortic atherosclerosis. BONES AND SOFT TISSUES: Degenerative changes of the left shoulder. IMPRESSION: 1. Diffuse peripheral predominant interstitial opacities and trace bilateral pleural effusions. 2. Mild cardiomegaly and aortic atherosclerosis. Electronically signed by: Evalene Coho MD 05/11/2024 12:53  PM EST RP Workstation: [id number]    Microbiology: Results for orders placed or performed during the hospital encounter of 05/11/24  Resp panel by RT-PCR (RSV, Flu A&B, Covid) Anterior Nasal Swab     Status: Abnormal   Collection Time: 05/11/24 12:36 PM    Specimen: Anterior Nasal Swab  Result Value Ref Range Status   SARS Coronavirus 2 by RT PCR NEGATIVE NEGATIVE Final    Comment: (NOTE) SARS-CoV-2 target nucleic acids are NOT DETECTED.  The SARS-CoV-2 RNA is generally detectable in upper respiratory specimens during the acute phase of infection. The lowest concentration of SARS-CoV-2 viral copies this assay can detect is 138 copies/mL. A negative result does not preclude SARS-Cov-2 infection and should not be used as the sole basis for treatment or other patient management decisions. A negative result may occur with  improper specimen collection/handling, submission of specimen other than nasopharyngeal swab, presence of viral mutation(s) within the areas targeted by this assay, and inadequate number of viral copies(<138 copies/mL). A negative result must be combined with clinical observations, patient history, and epidemiological information. The expected result is Negative.  Fact Sheet for Patients:  bloggercourse.com  Fact Sheet for Healthcare Providers:  seriousbroker.it  This test is no t yet approved or cleared by the [place]  FDA and  has been authorized for detection and/or diagnosis of SARS-CoV-2 by FDA under an Emergency Use Authorization (EUA). This EUA will remain  in effect (meaning this test can be used) for the duration of the COVID-19 declaration under Section 564(b)(1) of the Act, 21 U.S.C.section 360bbb-3(b)(1), unless the authorization is terminated  or revoked sooner.       Influenza A by PCR POSITIVE (A) NEGATIVE Final   Influenza B by PCR NEGATIVE NEGATIVE Final    Comment: (NOTE) The Xpert Xpress SARS-CoV-2/FLU/RSV plus assay is intended as an aid in the diagnosis of influenza from Nasopharyngeal swab specimens and should not be used as a sole basis for treatment. Nasal washings and aspirates are unacceptable for Xpert Xpress  SARS-CoV-2/FLU/RSV testing.  Fact Sheet for Patients: bloggercourse.com  Fact Sheet for Healthcare Providers: seriousbroker.it  This test is not yet approved or cleared by the [place]  FDA and has been authorized for detection and/or diagnosis of SARS-CoV-2 by FDA under an Emergency Use Authorization (EUA). This EUA will remain in effect (meaning this test can be used) for the duration of the COVID-19 declaration under Section 564(b)(1) of the Act, 21 U.S.C. section 360bbb-3(b)(1), unless the authorization is terminated or revoked.     Resp Syncytial Virus by PCR NEGATIVE NEGATIVE Final    Comment: (NOTE) Fact Sheet for Patients: bloggercourse.com  Fact Sheet for Healthcare Providers: seriousbroker.it  This test is not yet approved or cleared by the [place]  FDA and has been authorized for detection and/or diagnosis of SARS-CoV-2 by FDA under an Emergency Use Authorization (EUA). This EUA will remain in effect (meaning this test can be used) for the duration of the COVID-19 declaration under Section 564(b)(1) of the Act, 21 U.S.C. section 360bbb-3(b)(1), unless the authorization is terminated or revoked.  Performed at [facility], [address], [place], [place] [address]   Culture, blood (routine x 2)     Status: None   Collection Time: 05/11/24  4:22 PM   Specimen: BLOOD LEFT FOREARM  Result Value Ref Range Status   Specimen Description BLOOD LEFT FOREARM AEROBIC BOTTLE ONLY  Final   Special Requests   Final    Blood Culture results may not be  optimal due to an inadequate volume of blood received in culture bottles   Culture   Final    NO GROWTH 5 DAYS Performed at [facility], [address] [place], [place] [address]    Report Status 05/16/2024 FINAL  Final  Culture, blood (routine x 2)     Status: None   Collection Time: 05/11/24  4:22 PM   Specimen:  Left Antecubital; Blood  Result Value Ref Range Status   Specimen Description   Final    LEFT ANTECUBITAL BOTTLES DRAWN AEROBIC AND ANAEROBIC   Special Requests   Final    Blood Culture results may not be optimal due to an inadequate volume of blood received in culture bottles   Culture   Final    NO GROWTH 5 DAYS Performed at [facility], [address], [place], [place] [address]    Report Status 05/16/2024 FINAL  Final  Resp panel by RT-PCR (RSV, Flu A&B, Covid) Anterior Nasal Swab     Status: Abnormal   Collection Time: 05/19/24 11:46 AM   Specimen: Anterior Nasal Swab  Result Value Ref Range Status   SARS Coronavirus 2 by RT PCR NEGATIVE NEGATIVE Final    Comment: (NOTE) SARS-CoV-2 target nucleic acids are NOT DETECTED.  The SARS-CoV-2 RNA is generally detectable in upper respiratory specimens during the acute phase of infection. The lowest concentration of SARS-CoV-2 viral copies this assay can detect is 138 copies/mL. A negative result does not preclude SARS-Cov-2 infection and should not be used as the sole basis for treatment or other patient management decisions. A negative result may occur with  improper specimen collection/handling, submission of specimen other than nasopharyngeal swab, presence of viral mutation(s) within the areas targeted by this assay, and inadequate number of viral copies(<138 copies/mL). A negative result must be combined with clinical observations, patient history, and epidemiological information. The expected result is Negative.  Fact Sheet for Patients:  bloggercourse.com  Fact Sheet for Healthcare Providers:  seriousbroker.it  This test is no t yet approved or cleared by the [place]  FDA and  has been authorized for detection and/or diagnosis of SARS-CoV-2 by FDA under an Emergency Use Authorization (EUA). This EUA will remain  in effect (meaning this test can be used) for the  duration of the COVID-19 declaration under Section 564(b)(1) of the Act, 21 U.S.C.section 360bbb-3(b)(1), unless the authorization is terminated  or revoked sooner.       Influenza A by PCR POSITIVE (A) NEGATIVE Final   Influenza B by PCR NEGATIVE NEGATIVE Final    Comment: (NOTE) The Xpert Xpress SARS-CoV-2/FLU/RSV plus assay is intended as an aid in the diagnosis of influenza from Nasopharyngeal swab specimens and should not be used as a sole basis for treatment. Nasal washings and aspirates are unacceptable for Xpert Xpress SARS-CoV-2/FLU/RSV testing.  Fact Sheet for Patients: bloggercourse.com  Fact Sheet for Healthcare Providers: seriousbroker.it  This test is not yet approved or cleared by the [place]  FDA and has been authorized for detection and/or diagnosis of SARS-CoV-2 by FDA under an Emergency Use Authorization (EUA). This EUA will remain in effect (meaning this test can be used) for the duration of the COVID-19 declaration under Section 564(b)(1) of the Act, 21 U.S.C. section 360bbb-3(b)(1), unless the authorization is terminated or revoked.     Resp Syncytial Virus by PCR NEGATIVE NEGATIVE Final    Comment: (NOTE) Fact Sheet for Patients: bloggercourse.com  Fact Sheet for Healthcare Providers: seriousbroker.it  This test is not yet approved  or cleared by the [place]  FDA and has been authorized for detection and/or diagnosis of SARS-CoV-2 by FDA under an Emergency Use Authorization (EUA). This EUA will remain in effect (meaning this test can be used) for the duration of the COVID-19 declaration under Section 564(b)(1) of the Act, 21 U.S.C. section 360bbb-3(b)(1), unless the authorization is terminated or revoked.  Performed at [facility], [address] [place], [place] [address]   Resp panel by RT-PCR (RSV, Flu A&B, Covid) Anterior Nasal Swab      Status: Abnormal   Collection Time: 05/21/24 12:46 AM   Specimen: Anterior Nasal Swab  Result Value Ref Range Status   SARS Coronavirus 2 by RT PCR NEGATIVE NEGATIVE Final    Comment: (NOTE) SARS-CoV-2 target nucleic acids are NOT DETECTED.  The SARS-CoV-2 RNA is generally detectable in upper respiratory specimens during the acute phase of infection. The lowest concentration of SARS-CoV-2 viral copies this assay can detect is 138 copies/mL. A negative result does not preclude SARS-Cov-2 infection and should not be used as the sole basis for treatment or other patient management decisions. A negative result may occur with  improper specimen collection/handling, submission of specimen other than nasopharyngeal swab, presence of viral mutation(s) within the areas targeted by this assay, and inadequate number of viral copies(<138 copies/mL). A negative result must be combined with clinical observations, patient history, and epidemiological information. The expected result is Negative.  Fact Sheet for Patients:  bloggercourse.com  Fact Sheet for Healthcare Providers:  seriousbroker.it  This test is no t yet approved or cleared by the [place]  FDA and  has been authorized for detection and/or diagnosis of SARS-CoV-2 by FDA under an Emergency Use Authorization (EUA). This EUA will remain  in effect (meaning this test can be used) for the duration of the COVID-19 declaration under Section 564(b)(1) of the Act, 21 U.S.C.section 360bbb-3(b)(1), unless the authorization is terminated  or revoked sooner.       Influenza A by PCR POSITIVE (A) NEGATIVE Final   Influenza B by PCR NEGATIVE NEGATIVE Final    Comment: (NOTE) The Xpert Xpress SARS-CoV-2/FLU/RSV plus assay is intended as an aid in the diagnosis of influenza from Nasopharyngeal swab specimens and should not be used as a sole basis for treatment. Nasal washings and aspirates  are unacceptable for Xpert Xpress SARS-CoV-2/FLU/RSV testing.  Fact Sheet for Patients: bloggercourse.com  Fact Sheet for Healthcare Providers: seriousbroker.it  This test is not yet approved or cleared by the [place]  FDA and has been authorized for detection and/or diagnosis of SARS-CoV-2 by FDA under an Emergency Use Authorization (EUA). This EUA will remain in effect (meaning this test can be used) for the duration of the COVID-19 declaration under Section 564(b)(1) of the Act, 21 U.S.C. section 360bbb-3(b)(1), unless the authorization is terminated or revoked.     Resp Syncytial Virus by PCR NEGATIVE NEGATIVE Final    Comment: (NOTE) Fact Sheet for Patients: bloggercourse.com  Fact Sheet for Healthcare Providers: seriousbroker.it  This test is not yet approved or cleared by the [place]  FDA and has been authorized for detection and/or diagnosis of SARS-CoV-2 by FDA under an Emergency Use Authorization (EUA). This EUA will remain in effect (meaning this test can be used) for the duration of the COVID-19 declaration under Section 564(b)(1) of the Act, 21 U.S.C. section 360bbb-3(b)(1), unless the authorization is terminated or revoked.  Performed at [facility], [address] [place], [place] [address]   Resp panel by RT-PCR (RSV, Flu  A&B, Covid) Anterior Nasal Swab     Status: Abnormal   Collection Time: 05/23/24 10:36 AM   Specimen: Anterior Nasal Swab  Result Value Ref Range Status   SARS Coronavirus 2 by RT PCR NEGATIVE NEGATIVE Final    Comment: (NOTE) SARS-CoV-2 target nucleic acids are NOT DETECTED.  The SARS-CoV-2 RNA is generally detectable in upper respiratory specimens during the acute phase of infection. The lowest concentration of SARS-CoV-2 viral copies this assay can detect is 138 copies/mL. A negative result does not preclude  SARS-Cov-2 infection and should not be used as the sole basis for treatment or other patient management decisions. A negative result may occur with  improper specimen collection/handling, submission of specimen other than nasopharyngeal swab, presence of viral mutation(s) within the areas targeted by this assay, and inadequate number of viral copies(<138 copies/mL). A negative result must be combined with clinical observations, patient history, and epidemiological information. The expected result is Negative.  Fact Sheet for Patients:  bloggercourse.com  Fact Sheet for Healthcare Providers:  seriousbroker.it  This test is no t yet approved or cleared by the [place]  FDA and  has been authorized for detection and/or diagnosis of SARS-CoV-2 by FDA under an Emergency Use Authorization (EUA). This EUA will remain  in effect (meaning this test can be used) for the duration of the COVID-19 declaration under Section 564(b)(1) of the Act, 21 U.S.C.section 360bbb-3(b)(1), unless the authorization is terminated  or revoked sooner.       Influenza A by PCR POSITIVE (A) NEGATIVE Final   Influenza B by PCR NEGATIVE NEGATIVE Final    Comment: (NOTE) The Xpert Xpress SARS-CoV-2/FLU/RSV plus assay is intended as an aid in the diagnosis of influenza from Nasopharyngeal swab specimens and should not be used as a sole basis for treatment. Nasal washings and aspirates are unacceptable for Xpert Xpress SARS-CoV-2/FLU/RSV testing.  Fact Sheet for Patients: bloggercourse.com  Fact Sheet for Healthcare Providers: seriousbroker.it  This test is not yet approved or cleared by the [place]  FDA and has been authorized for detection and/or diagnosis of SARS-CoV-2 by FDA under an Emergency Use Authorization (EUA). This EUA will remain in effect (meaning this test can be used) for the duration of  the COVID-19 declaration under Section 564(b)(1) of the Act, 21 U.S.C. section 360bbb-3(b)(1), unless the authorization is terminated or revoked.     Resp Syncytial Virus by PCR NEGATIVE NEGATIVE Final    Comment: (NOTE) Fact Sheet for Patients: bloggercourse.com  Fact Sheet for Healthcare Providers: seriousbroker.it  This test is not yet approved or cleared by the [place]  FDA and has been authorized for detection and/or diagnosis of SARS-CoV-2 by FDA under an Emergency Use Authorization (EUA). This EUA will remain in effect (meaning this test can be used) for the duration of the COVID-19 declaration under Section 564(b)(1) of the Act, 21 U.S.C. section 360bbb-3(b)(1), unless the authorization is terminated or revoked.  Performed at [facility], [address] [place], [place] [address]     CBG: Recent Labs  Lab 05/31/24 2034 05/31/24 2259 06/01/24 2010  GLUCAP 53* 90 86    Discharge time spent:  35 minutes.  Signed: Eric Nunnery, MD [facility] 06/04/2024 "

## 2024-06-04 NOTE — TOC Transition Note (Signed)
 Transition of Care [facility]) - Discharge Note   Patient Details  Name: Hector Lowe MRN: [id number] Date of Birth: [birth year]  [facility] CM/SW Contact:  Lucie Lunger, LCSWA Phone Number: 06/04/2024, 10:25 AM  Clinical Narrative:    CSW spoke to [place] with [facility] who states that bed is open for pt at [facility] at this time. MD completed D/C. RN provided with number for report. Dorina states she will arrange EMS for pt. CSW spoke with pts son to provide update. TOC signing off.   Final next level of care: Hospice Medical Facility Barriers to Discharge: Barriers Resolved   Patient Goals and CMS Choice Patient states their goals for this hospitalization and ongoing recovery are:: Go to residenital hospice CMS Medicare.gov Compare Post Acute Care list provided to:: Patient Represenative (must comment) Choice offered to / list presented to : Adult Children [place] ownership interest in [facility].provided to:: Adult Children    Discharge Placement                Patient to be transferred to facility by: EMS Name of family member notified: Son Engineering Geologist Patient and family notified of of transfer: 06/04/24  Discharge Plan and Services Additional resources added to the After Visit Summary for                              [facility] Agency: [facility]        Social Drivers of Health (SDOH) Interventions SDOH Screenings   Food Insecurity: Patient Unable To Answer (06/03/2024)  Housing: Patient Unable To Answer (06/03/2024)  Transportation Needs: Patient Unable To Answer (06/03/2024)  Utilities: Patient Unable To Answer (06/03/2024)  Social Connections: Patient Unable To Answer (06/03/2024)  Tobacco Use: High Risk (05/11/2024)     Readmission Risk Interventions    06/02/2024    1:32 PM 05/30/2024   10:34 AM 05/27/2024    2:33 PM  Readmission Risk Prevention Plan  Transportation Screening Complete Complete Complete  Home Care Screening  Complete Complete Complete  Medication Review (RN CM) Complete Complete Complete

## 2024-06-22 DEATH — deceased
